# Patient Record
Sex: Female | Born: 1935
Health system: Southern US, Community
[De-identification: ages and names within clinical notes are randomized; demographics above are authoritative.]

## PROBLEM LIST (undated history)

## (undated) DIAGNOSIS — Z9889 Other specified postprocedural states: Secondary | ICD-10-CM

## (undated) DIAGNOSIS — R600 Localized edema: Secondary | ICD-10-CM

## (undated) DIAGNOSIS — C50912 Malignant neoplasm of unspecified site of left female breast: Secondary | ICD-10-CM

## (undated) DIAGNOSIS — I10 Essential (primary) hypertension: Secondary | ICD-10-CM

## (undated) DIAGNOSIS — J42 Unspecified chronic bronchitis: Secondary | ICD-10-CM

## (undated) DIAGNOSIS — M1990 Unspecified osteoarthritis, unspecified site: Secondary | ICD-10-CM

## (undated) DIAGNOSIS — C50919 Malignant neoplasm of unspecified site of unspecified female breast: Secondary | ICD-10-CM

## (undated) DIAGNOSIS — F419 Anxiety disorder, unspecified: Secondary | ICD-10-CM

## (undated) DIAGNOSIS — Z8489 Family history of other specified conditions: Secondary | ICD-10-CM

## (undated) DIAGNOSIS — E039 Hypothyroidism, unspecified: Secondary | ICD-10-CM

## (undated) DIAGNOSIS — D649 Anemia, unspecified: Secondary | ICD-10-CM

## (undated) DIAGNOSIS — R112 Nausea with vomiting, unspecified: Secondary | ICD-10-CM

## (undated) DIAGNOSIS — C189 Malignant neoplasm of colon, unspecified: Secondary | ICD-10-CM

## (undated) DIAGNOSIS — Z8669 Personal history of other diseases of the nervous system and sense organs: Secondary | ICD-10-CM

## (undated) DIAGNOSIS — I4892 Unspecified atrial flutter: Secondary | ICD-10-CM

## (undated) HISTORY — DX: Localized edema: R60.0

## (undated) HISTORY — PX: HERNIA REPAIR: SHX51

## (undated) HISTORY — DX: Essential (primary) hypertension: I10

## (undated) HISTORY — DX: Unspecified atrial flutter: I48.92

## (undated) HISTORY — DX: Personal history of other diseases of the nervous system and sense organs: Z86.69

## (undated) HISTORY — PX: CATARACT EXTRACTION: SUR2

## (undated) HISTORY — DX: Malignant neoplasm of unspecified site of unspecified female breast: C50.919

## (undated) HISTORY — DX: Malignant neoplasm of colon, unspecified: C18.9

## (undated) HISTORY — DX: Unspecified osteoarthritis, unspecified site: M19.90

## (undated) HISTORY — PX: COLECTOMY: SHX59

## (undated) HISTORY — PX: EYE SURGERY: SHX253

---

## 1973-05-01 HISTORY — PX: APPENDECTOMY: SHX54

## 1973-05-01 HISTORY — PX: ABDOMINAL HYSTERECTOMY: SHX81

## 1996-05-01 HISTORY — PX: BREAST LUMPECTOMY: SHX2

## 1996-05-01 HISTORY — PX: BREAST BIOPSY: SHX20

## 1997-09-21 ENCOUNTER — Other Ambulatory Visit: Admission: RE | Admit: 1997-09-21 | Discharge: 1997-09-21 | Payer: Self-pay | Admitting: Family Medicine

## 1997-11-17 ENCOUNTER — Other Ambulatory Visit: Admission: RE | Admit: 1997-11-17 | Discharge: 1997-11-17 | Payer: Self-pay | Admitting: Family Medicine

## 1998-03-09 ENCOUNTER — Encounter: Admission: RE | Admit: 1998-03-09 | Discharge: 1998-04-26 | Payer: Self-pay | Admitting: General Surgery

## 1998-11-23 ENCOUNTER — Other Ambulatory Visit: Admission: RE | Admit: 1998-11-23 | Discharge: 1998-11-23 | Payer: Self-pay | Admitting: Family Medicine

## 1999-11-16 ENCOUNTER — Other Ambulatory Visit: Admission: RE | Admit: 1999-11-16 | Discharge: 1999-11-16 | Payer: Self-pay | Admitting: Radiology

## 2000-05-01 HISTORY — PX: LOW ANTERIOR BOWEL RESECTION: SUR1240

## 2000-06-22 ENCOUNTER — Ambulatory Visit (HOSPITAL_COMMUNITY): Admission: RE | Admit: 2000-06-22 | Discharge: 2000-06-22 | Payer: Self-pay | Admitting: *Deleted

## 2000-06-22 ENCOUNTER — Encounter (INDEPENDENT_AMBULATORY_CARE_PROVIDER_SITE_OTHER): Payer: Self-pay | Admitting: Specialist

## 2000-06-29 ENCOUNTER — Encounter: Payer: Self-pay | Admitting: General Surgery

## 2000-06-29 ENCOUNTER — Encounter: Admission: RE | Admit: 2000-06-29 | Discharge: 2000-06-29 | Payer: Self-pay | Admitting: General Surgery

## 2000-07-10 ENCOUNTER — Encounter: Payer: Self-pay | Admitting: General Surgery

## 2000-07-10 ENCOUNTER — Encounter: Admission: RE | Admit: 2000-07-10 | Discharge: 2000-07-22 | Payer: Self-pay | Admitting: General Surgery

## 2000-07-17 ENCOUNTER — Inpatient Hospital Stay (HOSPITAL_COMMUNITY): Admission: RE | Admit: 2000-07-17 | Discharge: 2000-07-22 | Payer: Self-pay | Admitting: General Surgery

## 2000-07-17 ENCOUNTER — Encounter (INDEPENDENT_AMBULATORY_CARE_PROVIDER_SITE_OTHER): Payer: Self-pay

## 2000-08-23 ENCOUNTER — Encounter: Payer: Self-pay | Admitting: General Surgery

## 2000-08-23 ENCOUNTER — Ambulatory Visit (HOSPITAL_COMMUNITY): Admission: RE | Admit: 2000-08-23 | Discharge: 2000-08-23 | Payer: Self-pay | Admitting: General Surgery

## 2001-02-25 ENCOUNTER — Ambulatory Visit (HOSPITAL_BASED_OUTPATIENT_CLINIC_OR_DEPARTMENT_OTHER): Admission: RE | Admit: 2001-02-25 | Discharge: 2001-02-25 | Payer: Self-pay | Admitting: General Surgery

## 2001-07-03 ENCOUNTER — Ambulatory Visit (HOSPITAL_COMMUNITY): Admission: RE | Admit: 2001-07-03 | Discharge: 2001-07-03 | Payer: Self-pay | Admitting: *Deleted

## 2001-07-03 ENCOUNTER — Encounter (INDEPENDENT_AMBULATORY_CARE_PROVIDER_SITE_OTHER): Payer: Self-pay | Admitting: *Deleted

## 2003-12-24 ENCOUNTER — Encounter: Admission: RE | Admit: 2003-12-24 | Discharge: 2003-12-24 | Payer: Self-pay | Admitting: General Surgery

## 2004-09-14 ENCOUNTER — Encounter (INDEPENDENT_AMBULATORY_CARE_PROVIDER_SITE_OTHER): Payer: Self-pay | Admitting: Specialist

## 2004-09-14 ENCOUNTER — Ambulatory Visit (HOSPITAL_COMMUNITY): Admission: RE | Admit: 2004-09-14 | Discharge: 2004-09-14 | Payer: Self-pay | Admitting: *Deleted

## 2004-12-21 ENCOUNTER — Ambulatory Visit: Payer: Self-pay | Admitting: Oncology

## 2005-06-18 ENCOUNTER — Emergency Department (HOSPITAL_COMMUNITY): Admission: EM | Admit: 2005-06-18 | Discharge: 2005-06-18 | Payer: Self-pay | Admitting: Emergency Medicine

## 2005-12-17 ENCOUNTER — Ambulatory Visit: Payer: Self-pay | Admitting: Oncology

## 2005-12-19 LAB — CBC WITH DIFFERENTIAL/PLATELET
BASO%: 0.4 % (ref 0.0–2.0)
Basophils Absolute: 0 10*3/uL (ref 0.0–0.1)
EOS%: 2.8 % (ref 0.0–7.0)
HGB: 14 g/dL (ref 11.6–15.9)
LYMPH%: 27.5 % (ref 14.0–48.0)
MONO#: 0.4 10*3/uL (ref 0.1–0.9)
MONO%: 7.6 % (ref 0.0–13.0)
NEUT%: 61.7 % (ref 39.6–76.8)
RBC: 4.45 10*6/uL (ref 3.70–5.32)
RDW: 13.1 % (ref 11.3–14.5)
WBC: 5.6 10*3/uL (ref 3.9–10.0)
lymph#: 1.5 10*3/uL (ref 0.9–3.3)

## 2005-12-19 LAB — MORPHOLOGY
PLT EST: ADEQUATE
RBC Comments: NORMAL

## 2005-12-19 LAB — COMPREHENSIVE METABOLIC PANEL
ALT: 9 U/L (ref 0–40)
Alkaline Phosphatase: 49 U/L (ref 39–117)
BUN: 18 mg/dL (ref 6–23)
CO2: 23 mEq/L (ref 19–32)
Calcium: 9.4 mg/dL (ref 8.4–10.5)
Chloride: 106 mEq/L (ref 96–112)
Total Bilirubin: 1.5 mg/dL — ABNORMAL HIGH (ref 0.3–1.2)
Total Protein: 6.9 g/dL (ref 6.0–8.3)

## 2006-12-26 ENCOUNTER — Ambulatory Visit: Payer: Self-pay | Admitting: Oncology

## 2007-01-30 HISTORY — PX: ABDOMINAL HERNIA REPAIR: SHX539

## 2007-03-02 ENCOUNTER — Inpatient Hospital Stay (HOSPITAL_COMMUNITY): Admission: RE | Admit: 2007-03-02 | Discharge: 2007-03-03 | Payer: Self-pay | Admitting: General Surgery

## 2007-05-28 ENCOUNTER — Emergency Department (HOSPITAL_COMMUNITY): Admission: EM | Admit: 2007-05-28 | Discharge: 2007-05-28 | Payer: Self-pay | Admitting: Emergency Medicine

## 2007-12-13 ENCOUNTER — Encounter: Payer: Self-pay | Admitting: *Deleted

## 2008-06-29 ENCOUNTER — Ambulatory Visit: Payer: Self-pay | Admitting: Vascular Surgery

## 2009-05-01 HISTORY — PX: CATARACT EXTRACTION W/ INTRAOCULAR LENS  IMPLANT, BILATERAL: SHX1307

## 2010-09-13 NOTE — Op Note (Signed)
NAMECATHARINE, [name]             ACCOUNT NO.:  [id number]   MEDICAL RECORD NO.:  [id number]          PATIENT TYPE:  AMB   LOCATION:  DAY                          FACILITY:  [facility]   PHYSICIAN:  Adolph Pollack, M.D.DATE OF BIRTH:  April 19, 1928   DATE OF PROCEDURE:  03/01/2007  DATE OF DISCHARGE:                               OPERATIVE REPORT   PREOPERATIVE DIAGNOSIS:  Ventral incisional hernia.   POSTOPERATIVE DIAGNOSIS:  Ventral incisional hernia.   PROCEDURE:  Laparoscopic ventral incisional hernia repair with mesh.   SURGEON:  Adolph Pollack, M.D.   ASSISTANT:  Ollen Gross. Vernell Morgans, M.D.   ANESTHESIA:  General.   INDICATION:  [name] is a 77 year old female who has had a  hysterectomy and a sigmoid colectomy through midline incisions.  She has  developed a ventral incisional hernia that is getting larger and  concerning her.  No obstructive symptoms.  She now presents for elective  repair.  We discussed the procedure and the risks preoperatively.   TECHNIQUE:  She was seen in the holding area, then brought to the  operating room and placed supine on the operating table.  A general  anesthetic was administered.  The abdominal wall was then sterilely  prepped and draped after a Foley catheter was inserted.  In the right  upper quadrant, I made an incision through the skin and subcutaneous  tissue and fascial layers of peritoneum, entering the peritoneal cavity  under direct vision.  A pursestring suture of 0 Vicryl was placed around  the fascial edges.  A Hasson trocar was introduced in the peritoneal  cavity and pneumoperitoneum created by insufflation of CO2 gas.   Next, the angled laparoscope was introduced.  Adhesions were noted  between the omentum and part of the large and small bowel and the  anterior abdominal wall.  Under laparoscopic vision, a 5-mm trocar was  placed in the right lower quadrant.  Using sharp cold scissors, I lysed  adhesions between the  anterior abdominal wall, large intestine, small  intestine and the omentum and the periumbilical area and up and down the  midline.  No intestinal injury was noted.  Bleeding points on the  omentum were controlled with electrocautery.   Following this, I identified the periumbilical hernia.  Using a spinal  needle, I identified the 4 edges of the hernia in 4 quadrants and  measured 4 cm away from this for adequate overlap.  I then drew an oval;  this was approximately 15 x 18 cm.  I brought a piece of 15 x 18-cm  piece of Parietex mesh with a nonadherent barrier into the field.  In  the 4 quadrants I placed 4 anchoring sutures of 0 and #1 Novofil.  The  mesh was then hydrated then placed in the abdominal cavity.  The mesh  was deployed with the nonadherent side facing the viscera.  I then made  4 stab incisions in the 4 quadrants around the hernia and pulled up the  anchoring sutures across a fascial bridge and tied these down, anchoring  the mesh initially to the abdominal wall.  I then further anchored the  mesh to the abdominal wall with spiral tacks in both an outer and inner  rim of tacks; this provided for more than adequate coverage of the  hernia with adequate overlap.   I then inspected the area and the intestine.  No bleeding was noted and  no intestinal injury was noted.   Following this, I released the CO2 gas and watch the mesh approximate  the omentum and some of the viscera.  I then removed all trocars.   The right upper quadrant fascial defect was closed by tightening up and  tying down the pursestring suture.  All skin incisions were closed with  4-0 Monocryl subcuticular stitches followed by Steri-Strips and sterile  dressings.   She tolerated the procedure well without any apparent complications and  was taken to the recovery room in satisfactory condition.      Adolph Pollack, M.D.  Electronically Signed     TJR/MEDQ  D:  03/01/2007  T:  03/02/2007   Job:  [id number]   cc:   Gaspar Garbe, M.D.  Fax: [phone]

## 2010-09-13 NOTE — Procedures (Signed)
CAROTID DUPLEX EXAM   INDICATION:  Vertigo.   HISTORY:  Diabetes:  No.  Cardiac:  Atrial fib.  Hypertension:  Yes.  Smoking:  No.  Previous Surgery:  CV History:  Amaurosis Fugax No, Paresthesias No, Hemiparesis No.                                       RIGHT             LEFT  Brachial systolic pressure:         150               140  Brachial Doppler waveforms:         Biphasic          Biphasic  Vertebral direction of flow:        Antegrade         Antegrade  DUPLEX VELOCITIES (cm/sec)  CCA peak systolic                   92                113  ECA peak systolic                   108               154  ICA peak systolic                   81                88  ICA end diastolic                   23                31  PLAQUE MORPHOLOGY:                  Heterogenous      Heterogenous  PLAQUE AMOUNT:                      Mild              Mild  PLAQUE LOCATION:                    ICA, ECA          ICA, ECA   IMPRESSION:  1. 1-39% stenosis noted in bilateral internal carotid arteries.  2. Antegrade bilateral vertebral arteries.    ___________________________________________  Janetta Hora Fields, MD   MG/MEDQ  D:  06/29/2008  T:  06/29/2008  Job:  [id number]

## 2010-09-16 NOTE — Op Note (Signed)
[facility]  Patient:    [name], [name]              MRN: [id number] Proc. Date: 08/23/00 Adm. Date:  [id number] Attending:  Arlis Porta                           Operative Report  PREOPERATIVE DIAGNOSIS:  Colon cancer.  POSTOPERATIVE DIAGNOSIS:  Colon cancer.  PROCEDURE:  Insertion of single lumen Port-A-Cath in the right subclavian vein.  SURGEON:  Dr. Abbey Chatters.  ANESTHESIA:  Local (1% lidocaine with epinephrine plus 0.5% plain Marcaine) with MAC.  INDICATIONS FOR PROCEDURE:  [name] is a 77 year old status post low anterior resection for colorectal cancer. She is going to require chemotherapy and needs long-term access. The procedure and the risks of Port-A-Cath insertion including but not limited to bleeding, infection, pneumothorax as well as deep venous thrombosis were explained to her. She seemed to understand and agreed to proceed.  TECHNIQUE:  She was placed supine on the operating table and given intravenous sedation. A roll was placed under the back. The right chest wall and neck were sterilely prepped and draped. Local anesthetic was infiltrated in the right clavicular region and using a 16 gauge needle, the left subclavian vein was cannulated and a wire passed through to the superior vena cava. This was verified by way of fluoroscopy. Next, a local anesthetic was infiltrated inferior to the puncture site and a pocket created for the Port-A-Cath. The catheter was then tunneled from the inferior to the superior incision. The vein was dilated and then dilator introducer complex introduced into the vein. The wire and dilator were removed and the catheter threaded through the peel-away sheath introducer. The peel-away sheath was then removed. Under fluoroscopic guidance, the tip of the catheter was pulled back to the superior vena cava right atrial junction. The catheter was then cut and the catheter was inserted  onto the port. The port was tested with ______ and flushed easily. The port was then anchored to the chest wall with interrupted 2-0 Prolene sutures. I then used fluoroscopy to verify the position of the port and the smooth curve of the catheter as it went into the right subclavian vein and then into the superior vena cava.  Next the subcutaneous fat was closed over the port with running 3-0 Vicryl suture. The skin incisions were closed with 4-0 monocryl subcuticular stitches followed by Steri-Strips and sterile dressings.  The patient tolerated the procedure well without any apparent complications and was taken to the recovery room in satisfactory condition. A portable chest x-ray is pending at this time. She will be taking 1 mg of Coumadin a day to decrease the deep venous thrombosis risk. DD:  08/23/00 TD:  08/24/00 Job: [id number]

## 2010-09-16 NOTE — Discharge Summary (Signed)
[facility]  Patient:    [name], [name]              MRN: [id number] Adm. Date:  [id number] Disc. Date: [id number] Attending:  Arlis Porta CC:         Genene Churn. Cyndie Chime, M.D.             Lacretia Leigh. Quintella Reichert, M.D.             Katherine Roan, M.D.             Roosvelt Harps, M.D.                           Discharge Summary  PRINCIPLE DISCHARGE DIAGNOSIS:  Poorly differentiated adenocarcinoma of the sigmoid colon.  SECONDARY DIAGNOSES: 1. Breast cancer. 2. Hypertension. 3. Hypercholesterolemia. 4. Arthritis.  PROCEDURE:  Low anterior resection of the colon, July 17, 2000.  INDICATIONS:  The patient is a 77 year old female with history of invasive breast cancer in the past.  She was having some hematochezia and underwent colonoscopy.  There was a lesion in the sigmoid colon that was biopsied and positive for moderately differentiated adenocarcinoma at the biopsy site.  She is admitted for elective resection.  HOSPITAL COURSE:  She underwent the low anterior resection of the cancer which was at the rectosigmoid junction.  She tolerated this well.  Postoperatively, her antihypertensives were withheld.  Dr. Cyndie Chime was consulted regarding her colon cancer, and he saw her in the hospital.  Plans were made to discuss the possibility of chemotherapy as an outpatient.  It should be noted that she had some mild hyperbilirubinemia, but this was preoperative, and the liver was normal to visualization and palpation.  She had some mild hypovolemia which was resolved.  She was restarted on her antihypertensive as her blood pressure began to become a little bit higher.  She had an area of wound that appeared to be a little bit indurated and red, and I probed this area, but I could not note any infection.  She was empirically started on antibiotics, and it cleared up nicely.  By her fifth postoperative day, she had been passing flatus,  tolerating a diet.  She had a BM on her third postoperative day.  She is felt to be ready for discharge.  Of note, is that she was in the [place] study.  DISPOSITION:  Discharged to home in satisfactory condition, July 22, 2000.  DISCHARGE FOLLOWUP:  She will return in approximately one week for staple removal.  DISCHARGE INSTRUCTIONS:  She was given an instruction sheet.  DISCHARGE MEDICATIONS:  She will take all of her prehospitalization medications and Vicodin for pain. DD:  08/07/00 TD:  08/07/00 Job: [id number]

## 2010-09-16 NOTE — Consult Note (Signed)
[facility]  Patient:    [name], [name]              MRN: [id number] Proc. Date: 07/18/00 Adm. Date:  [id number] Attending:  Arlis Porta CC:         Adolph Pollack, M.D.  Katherine Roan, M.D.  Roosvelt Harps, M.D.  Lacretia Leigh. Quintella Reichert, M.D.   Consultation Report  HISTORY OF PRESENT ILLNESS:  Pleasant 77 year old woman well known to me. She was previously diagnosed with breast cancer in 1998. She was treated with chemotherapy following her surgery. Back in November of last year she noted a scant amount of blood on her toilet tissue. She mentioned this to her gynecologist at time of her routine exam in January of this year. He arranged for a colonoscopy done by Dr. Roosvelt Harps on June 02, 2000. She was found to have a cancer at the rectosigmoid junction. She underwent exploratory laparotomy by Dr. Avel Peace on July 17, 2000. Her liver was normal to inspection and palpation. No peritoneal spread. A low anterior resection was performed. Pathology shows a 4 cm poorly differentiated adenocarcinoma invading into and focally through the muscularis. Seven lymph nodes are negative.  IMPRESSION:   T2, N0, M0 colon cancer in a lady with a history of previous stage II breast cancer.  RECOMMENDATIONS:  We are offering adjuvant chemotherapy to Mohs stage II patients at this time. Her situation is borderline with only focal extension through the bowel wall, however, the poorly differentiated histology is an adverse prognostic factor.  She is undecided at this time whether or not she wants to go on a chemotherapy program in view of her previous negative experience with chemotherapy for breast cancer.  I have encouraged her to take her time to make a decision she is comfortable with. I will arrange an outpatient follow-up session for further discussion when she recuperates fully from surgery.  LABORATORY AND ACCESSORY  DATA:  Preoperative CT scan showed no evidence of metastatic disease. Preoperative labs showed a hemoglobin of 14.3, normal liver chemistries except bilirubin of 1.4, CEA of 0.7, LDH 166.  Chest radiograph was negative. Most recent mammograms done July 10, 2000 were negative.  Thank you for this consultation. DD:  07/19/00 TD:  07/19/00 Job: [id number]

## 2010-09-16 NOTE — Op Note (Signed)
[facility]  Patient:    [name]              MRN: [id number] Proc. Date: 07/17/00 Adm. Date:  [id number] Attending:  Arlis Porta CC:         Leroy Sea., M.D.  Roosvelt Harps, M.D.  Genene Churn. Cyndie Chime, M.D.   Operative Report  PREOPERATIVE DIAGNOSIS:  Sigmoid colon cancer.  POSTOPERATIVE DIAGNOSIS:  Cancer of the rectosigmoid junction.  PROCEDURE:  Low anterior resection of the rectosigmoid junction.  SURGEON:  Dr. Abbey Chatters.  ASSISTANT:  Dr. Lebron Conners.  ANESTHESIA:  General.  INDICATIONS FOR PROCEDURE:  This is a 77 year old female who was having some hematochezia and some lower abdominal pain. She underwent a colonoscopy which demonstrated some ascending colon polyps that were biopsied and tubular adenomas. There was an area of the sigmoid colon where a large flat mass was noted and was biopsied and was positive for moderately differentiated adenocarcinoma. She is admitted now for elective resection. CT scan did not demonstrate any hepatic metastases. She did have mild hyperbilirubinemia with a bilirubin of 1.4.  FINDINGS:  The tumor was right at the rectosigmoid junction just proximal to the peritoneal reflection. The liver was smooth without lesions. The gallbladder was distended without stones. No noted small bowel metastases.  TECHNIQUE:  She is placed supine on the operating table and a general anesthetic was administered. She was then placed in the lithotomy position. The abdomen and perineum were sterilely prepped and draped. A lower midline incision was made beginning at the pubis and extending up just superior to the umbilicus. The subcutaneous tissue and fascia were divided with the cautery. The peritoneum was entered sharply. Once in the peritoneal cavity, I palpated the lesion right at the rectosigmoid junction just proximal to the peritoneal reflection. I then explored the abdomen  and noted that the liver was smooth without lesions. The gallbladder was distended but I could not palpate gallstones. There were no obvious small bowel lesions.  Next, she was placed in the Trendelenburg position and then began by localizing a point near the proximal sigmoid colon area and dividing the sigmoid colon here. I then resected a wedge of mesentery all the way down to the sacral brim and ligated the mesenteric vessels and veins with ties. Before I did this, I did identify the left ureter and no injury to it was noted. At this point, I had mobilized the rectum from the pelvic brim distally and then mobilized the lateral stalks with the cautery using clips. When I was 3-4 cm distal to the tumor, I divided the colon with the reticulating stapler. I subsequently passed the specimen off the field.  I then mobilized the sigmoid colon and descending colon by incising its lateral attachments. This allowed for the proximal sigmoid colon to come down in the pelvis without tension. I removed the staple line from the proximal sigmoid colon and placed a size 31 anvil in it. A pursestring suture was placed around the opening and this held in the anvil in. Next, the anus was dilated and the handle end of the size 31 EEA stapler was passed through the anus until I could see it in the rectum. A stapled circular anastomosis was then performed. Proximal to the anastomosis, the bowel was occluded and air was insufflated with water in the pelvis and no leaks were noted. The anastomosis appeared healthy by way of proctosigmoidoscopy.  Next, I copiously irrigated the abdomen and a  few bleeding points were controlled with the cautery. When hemostasis was adequate, more irrigation was performed of the left gutter area. I then placed the omentum over the small bowel. The instrument and sponge counts at this point were reported to be correct.  The fascia was then closed with a running #1 PDS suture.  The subcutaneous tissue was irrigated and the skin was closed with staples.  The patient tolerated the procedure well without any apparent complications and was taken to the recovery room in satisfactory condition. DD:  07/17/00 TD:  07/18/00 Job: [id number]

## 2011-02-08 LAB — DIFFERENTIAL
Band Neutrophils: 0
Basophils Absolute: 0
Basophils Relative: 0
Blasts: 0
Eosinophils Absolute: 0.2
Eosinophils Relative: 3
Lymphs Abs: 1.5
Monocytes Absolute: 0.5
Monocytes Relative: 7
Myelocytes: 0
Neutro Abs: 5.2
Neutrophils Relative %: 70
nRBC: 0

## 2011-02-08 LAB — COMPREHENSIVE METABOLIC PANEL
CO2: 30
Calcium: 9.4
Chloride: 100
Creatinine, Ser: 0.9
Sodium: 136

## 2011-02-08 LAB — CBC
MCV: 90.1
Platelets: 189
RDW: 12.7

## 2011-07-13 ENCOUNTER — Encounter: Payer: Self-pay | Admitting: *Deleted

## 2011-07-13 DIAGNOSIS — C801 Malignant (primary) neoplasm, unspecified: Secondary | ICD-10-CM | POA: Insufficient documentation

## 2011-07-13 DIAGNOSIS — I4891 Unspecified atrial fibrillation: Secondary | ICD-10-CM | POA: Insufficient documentation

## 2011-07-13 DIAGNOSIS — Z8669 Personal history of other diseases of the nervous system and sense organs: Secondary | ICD-10-CM | POA: Insufficient documentation

## 2011-08-21 ENCOUNTER — Other Ambulatory Visit: Payer: Self-pay | Admitting: Neurology

## 2011-08-21 DIAGNOSIS — R413 Other amnesia: Secondary | ICD-10-CM

## 2011-08-21 DIAGNOSIS — R269 Unspecified abnormalities of gait and mobility: Secondary | ICD-10-CM

## 2011-08-23 ENCOUNTER — Ambulatory Visit
Admission: RE | Admit: 2011-08-23 | Discharge: 2011-08-23 | Disposition: A | Discharge status: Home / Self Care | Payer: Medicare Other | Source: Ambulatory Visit | Attending: Neurology | Admitting: Neurology

## 2011-08-23 DIAGNOSIS — R413 Other amnesia: Secondary | ICD-10-CM

## 2011-08-23 DIAGNOSIS — R269 Unspecified abnormalities of gait and mobility: Secondary | ICD-10-CM

## 2011-09-29 ENCOUNTER — Encounter: Payer: Self-pay | Admitting: *Deleted

## 2011-10-04 ENCOUNTER — Encounter: Payer: Self-pay | Admitting: Cardiovascular Disease

## 2013-09-25 ENCOUNTER — Other Ambulatory Visit: Payer: Self-pay | Admitting: Otolaryngology

## 2013-09-25 DIAGNOSIS — H903 Sensorineural hearing loss, bilateral: Secondary | ICD-10-CM

## 2013-09-25 DIAGNOSIS — H905 Unspecified sensorineural hearing loss: Secondary | ICD-10-CM

## 2013-10-01 ENCOUNTER — Encounter (INDEPENDENT_AMBULATORY_CARE_PROVIDER_SITE_OTHER): Payer: Self-pay

## 2013-10-01 ENCOUNTER — Ambulatory Visit
Admission: RE | Admit: 2013-10-01 | Discharge: 2013-10-01 | Disposition: A | Discharge status: Home / Self Care | Payer: 59 | Source: Ambulatory Visit | Attending: Otolaryngology | Admitting: Otolaryngology

## 2013-10-01 DIAGNOSIS — H903 Sensorineural hearing loss, bilateral: Secondary | ICD-10-CM

## 2013-10-01 DIAGNOSIS — H905 Unspecified sensorineural hearing loss: Secondary | ICD-10-CM

## 2013-10-01 MED ORDER — GADOBENATE DIMEGLUMINE 529 MG/ML IV SOLN
18.0000 mL | Freq: Once | INTRAVENOUS | Status: AC | PRN
  Administered 2013-10-01: 18 mL via INTRAVENOUS

## 2013-10-07 ENCOUNTER — Other Ambulatory Visit: Payer: BC Managed Care – PPO

## 2014-01-03 ENCOUNTER — Inpatient Hospital Stay (HOSPITAL_COMMUNITY): Payer: Medicare Other

## 2014-01-03 ENCOUNTER — Encounter (HOSPITAL_COMMUNITY): Payer: Self-pay | Admitting: Emergency Medicine

## 2014-01-03 ENCOUNTER — Emergency Department (HOSPITAL_COMMUNITY): Payer: Medicare Other

## 2014-01-03 ENCOUNTER — Inpatient Hospital Stay (HOSPITAL_COMMUNITY)
Admission: EM | Admit: 2014-01-03 | Discharge: 2014-01-06 | DRG: 308 | Disposition: A | Discharge status: Home / Self Care | Payer: Medicare Other | Attending: Internal Medicine | Admitting: Internal Medicine

## 2014-01-03 DIAGNOSIS — I498 Other specified cardiac arrhythmias: Principal | ICD-10-CM | POA: Diagnosis present

## 2014-01-03 DIAGNOSIS — I484 Atypical atrial flutter: Secondary | ICD-10-CM

## 2014-01-03 DIAGNOSIS — Z85038 Personal history of other malignant neoplasm of large intestine: Secondary | ICD-10-CM | POA: Diagnosis not present

## 2014-01-03 DIAGNOSIS — I10 Essential (primary) hypertension: Secondary | ICD-10-CM | POA: Diagnosis present

## 2014-01-03 DIAGNOSIS — Z87891 Personal history of nicotine dependence: Secondary | ICD-10-CM | POA: Diagnosis not present

## 2014-01-03 DIAGNOSIS — E039 Hypothyroidism, unspecified: Secondary | ICD-10-CM | POA: Diagnosis present

## 2014-01-03 DIAGNOSIS — I4891 Unspecified atrial fibrillation: Secondary | ICD-10-CM | POA: Diagnosis present

## 2014-01-03 DIAGNOSIS — G9340 Encephalopathy, unspecified: Secondary | ICD-10-CM | POA: Diagnosis present

## 2014-01-03 DIAGNOSIS — I4892 Unspecified atrial flutter: Secondary | ICD-10-CM | POA: Diagnosis present

## 2014-01-03 DIAGNOSIS — R079 Chest pain, unspecified: Secondary | ICD-10-CM

## 2014-01-03 DIAGNOSIS — I471 Supraventricular tachycardia: Secondary | ICD-10-CM

## 2014-01-03 DIAGNOSIS — R7989 Other specified abnormal findings of blood chemistry: Secondary | ICD-10-CM | POA: Diagnosis present

## 2014-01-03 DIAGNOSIS — Z8249 Family history of ischemic heart disease and other diseases of the circulatory system: Secondary | ICD-10-CM

## 2014-01-03 DIAGNOSIS — Z8669 Personal history of other diseases of the nervous system and sense organs: Secondary | ICD-10-CM

## 2014-01-03 DIAGNOSIS — R0789 Other chest pain: Secondary | ICD-10-CM | POA: Diagnosis present

## 2014-01-03 DIAGNOSIS — I369 Nonrheumatic tricuspid valve disorder, unspecified: Secondary | ICD-10-CM

## 2014-01-03 LAB — COMPREHENSIVE METABOLIC PANEL
ALK PHOS: 58 U/L (ref 39–117)
ALT: 29 U/L (ref 0–35)
AST: 20 U/L (ref 0–37)
Albumin: 3.6 g/dL (ref 3.5–5.2)
Anion gap: 14 (ref 5–15)
BUN: 21 mg/dL (ref 6–23)
CO2: 24 meq/L (ref 19–32)
Calcium: 9.1 mg/dL (ref 8.4–10.5)
Chloride: 103 mEq/L (ref 96–112)
Creatinine, Ser: 0.85 mg/dL (ref 0.50–1.10)
GFR calc Af Amer: 71 mL/min — ABNORMAL LOW (ref >90)
GFR, EST NON AFRICAN AMERICAN: 61 mL/min — AB (ref >90)
GLUCOSE: 159 mg/dL — AB (ref 70–99)
POTASSIUM: 4 meq/L (ref 3.7–5.3)
SODIUM: 141 meq/L (ref 137–147)
Total Bilirubin: 2.2 mg/dL — ABNORMAL HIGH (ref 0.3–1.2)
Total Protein: 6.7 g/dL (ref 6.0–8.3)

## 2014-01-03 LAB — TSH: TSH: 4.71 u[IU]/mL — AB (ref 0.350–4.500)

## 2014-01-03 LAB — PROTIME-INR
INR: 1.01 (ref 0.00–1.49)
INR: 1.14 (ref 0.00–1.49)
Prothrombin Time: 13.3 seconds (ref 11.6–15.2)
Prothrombin Time: 14.6 seconds (ref 11.6–15.2)

## 2014-01-03 LAB — CBC WITH DIFFERENTIAL/PLATELET
BASOS PCT: 0 % (ref 0–1)
Basophils Absolute: 0 10*3/uL (ref 0.0–0.1)
Eosinophils Absolute: 0 10*3/uL (ref 0.0–0.7)
Eosinophils Relative: 0 % (ref 0–5)
HEMATOCRIT: 40.1 % (ref 36.0–46.0)
Hemoglobin: 13.1 g/dL (ref 12.0–15.0)
LYMPHS PCT: 11 % — AB (ref 12–46)
Lymphs Abs: 1.2 10*3/uL (ref 0.7–4.0)
MCH: 30.2 pg (ref 26.0–34.0)
MCHC: 32.7 g/dL (ref 30.0–36.0)
MCV: 92.4 fL (ref 78.0–100.0)
MONO ABS: 1 10*3/uL (ref 0.1–1.0)
MONOS PCT: 10 % (ref 3–12)
Neutro Abs: 8.4 10*3/uL — ABNORMAL HIGH (ref 1.7–7.7)
Neutrophils Relative %: 79 % — ABNORMAL HIGH (ref 43–77)
Platelets: 161 10*3/uL (ref 150–400)
RBC: 4.34 MIL/uL (ref 3.87–5.11)
RDW: 13.6 % (ref 11.5–15.5)
WBC: 10.5 10*3/uL (ref 4.0–10.5)

## 2014-01-03 LAB — BASIC METABOLIC PANEL
Anion gap: 17 — ABNORMAL HIGH (ref 5–15)
BUN: 26 mg/dL — AB (ref 6–23)
CALCIUM: 9.7 mg/dL (ref 8.4–10.5)
CO2: 23 mEq/L (ref 19–32)
CREATININE: 1.02 mg/dL (ref 0.50–1.10)
Chloride: 100 mEq/L (ref 96–112)
GFR, EST AFRICAN AMERICAN: 57 mL/min — AB (ref >90)
GFR, EST NON AFRICAN AMERICAN: 49 mL/min — AB (ref >90)
Glucose, Bld: 150 mg/dL — ABNORMAL HIGH (ref 70–99)
Potassium: 4.1 mEq/L (ref 3.7–5.3)
Sodium: 140 mEq/L (ref 137–147)

## 2014-01-03 LAB — CBC
HEMATOCRIT: 45.2 % (ref 36.0–46.0)
Hemoglobin: 15.1 g/dL — ABNORMAL HIGH (ref 12.0–15.0)
MCH: 31 pg (ref 26.0–34.0)
MCHC: 33.4 g/dL (ref 30.0–36.0)
MCV: 92.8 fL (ref 78.0–100.0)
Platelets: 170 10*3/uL (ref 150–400)
RBC: 4.87 MIL/uL (ref 3.87–5.11)
RDW: 13.7 % (ref 11.5–15.5)
WBC: 12 10*3/uL — ABNORMAL HIGH (ref 4.0–10.5)

## 2014-01-03 LAB — TROPONIN I: Troponin I: <0.3 ng/mL (ref <0.30)

## 2014-01-03 LAB — PRO B NATRIURETIC PEPTIDE: Pro B Natriuretic peptide (BNP): 838.2 pg/mL — ABNORMAL HIGH (ref 0–450)

## 2014-01-03 LAB — APTT
aPTT: 27 seconds (ref 24–37)
aPTT: 26 seconds (ref 24–37)

## 2014-01-03 LAB — MRSA PCR SCREENING: MRSA by PCR: NEGATIVE

## 2014-01-03 LAB — PHOSPHORUS: Phosphorus: 3 mg/dL (ref 2.3–4.6)

## 2014-01-03 LAB — I-STAT TROPONIN, ED: Troponin i, poc: 0 ng/mL (ref 0.00–0.08)

## 2014-01-03 LAB — MAGNESIUM: Magnesium: 1.9 mg/dL (ref 1.5–2.5)

## 2014-01-03 MED ORDER — SODIUM CHLORIDE 0.9 % IJ SOLN
10.0000 mL | Freq: Two times a day (BID) | INTRAMUSCULAR | Status: DC
  Administered 2014-01-03 – 2014-01-06 (×4): 10 mL

## 2014-01-03 MED ORDER — DILTIAZEM HCL 100 MG IV SOLR: 10.0000 mg/h | Freq: Once | INTRAVENOUS | Status: DC

## 2014-01-03 MED ORDER — ADENOSINE 6 MG/2ML IV SOLN
INTRAVENOUS | Status: AC
  Administered 2014-01-03: 6 mg via INTRAVENOUS
  Filled 2014-01-03: qty 2

## 2014-01-03 MED ORDER — ACETAMINOPHEN 325 MG PO TABS
650.0000 mg | ORAL_TABLET | Freq: Four times a day (QID) | ORAL | Status: DC | PRN
Start: 2014-01-03 — End: 2014-01-06

## 2014-01-03 MED ORDER — ONDANSETRON HCL 4 MG/2ML IJ SOLN: 4.0000 mg | Freq: Four times a day (QID) | INTRAMUSCULAR | Status: DC | PRN

## 2014-01-03 MED ORDER — HEPARIN (PORCINE) IN NACL 100-0.45 UNIT/ML-% IJ SOLN
1100.0000 [IU]/h | INTRAMUSCULAR | Status: AC
  Administered 2014-01-03: 1100 [IU]/h via INTRAVENOUS
  Filled 2014-01-03 (×4): qty 250

## 2014-01-03 MED ORDER — ASPIRIN 325 MG PO TABS
325.0000 mg | ORAL_TABLET | ORAL | Status: DC
  Filled 2014-01-03: qty 1

## 2014-01-03 MED ORDER — DILTIAZEM LOAD VIA INFUSION
10.0000 mg | Freq: Once | INTRAVENOUS | Status: AC
  Administered 2014-01-03: 10 mg via INTRAVENOUS
  Filled 2014-01-03: qty 10

## 2014-01-03 MED ORDER — SODIUM CHLORIDE 0.9 % IJ SOLN
3.0000 mL | Freq: Two times a day (BID) | INTRAMUSCULAR | Status: DC
  Administered 2014-01-03 – 2014-01-05 (×3): 3 mL via INTRAVENOUS

## 2014-01-03 MED ORDER — ONDANSETRON HCL 4 MG PO TABS: 4.0000 mg | ORAL_TABLET | Freq: Four times a day (QID) | ORAL | Status: DC | PRN

## 2014-01-03 MED ORDER — ASPIRIN 81 MG PO CHEW
CHEWABLE_TABLET | ORAL | Status: AC
  Administered 2014-01-03: 243 mg
  Filled 2014-01-03: qty 3

## 2014-01-03 MED ORDER — LEVOTHYROXINE SODIUM 25 MCG PO TABS
25.0000 ug | ORAL_TABLET | Freq: Every day | ORAL | Status: DC
  Administered 2014-01-04 – 2014-01-06 (×3): 25 ug via ORAL
  Filled 2014-01-03 (×4): qty 1

## 2014-01-03 MED ORDER — HEPARIN BOLUS VIA INFUSION
2000.0000 [IU] | Freq: Once | INTRAVENOUS | Status: AC
  Administered 2014-01-03: 2000 [IU] via INTRAVENOUS
  Filled 2014-01-03: qty 2000

## 2014-01-03 MED ORDER — ACETAMINOPHEN 650 MG RE SUPP
650.0000 mg | Freq: Four times a day (QID) | RECTAL | Status: DC | PRN
Start: 2014-01-03 — End: 2014-01-06

## 2014-01-03 MED ORDER — SODIUM CHLORIDE 0.9 % IV SOLN
INTRAVENOUS | Status: DC
  Administered 2014-01-03: 12:00:00 via INTRAVENOUS

## 2014-01-03 MED ORDER — NITROGLYCERIN 0.4 MG SL SUBL
0.4000 mg | SUBLINGUAL_TABLET | SUBLINGUAL | Status: DC | PRN
  Administered 2014-01-03: 0.4 mg via SUBLINGUAL
  Filled 2014-01-03: qty 1

## 2014-01-03 MED ORDER — MORPHINE SULFATE 2 MG/ML IJ SOLN: 1.0000 mg | INTRAMUSCULAR | Status: DC | PRN

## 2014-01-03 MED ORDER — DILTIAZEM HCL 100 MG IV SOLR
5.0000 mg/h | INTRAVENOUS | Status: DC
  Administered 2014-01-03 (×3): 10 mg/h via INTRAVENOUS
  Administered 2014-01-03: 5 mg/h via INTRAVENOUS

## 2014-01-03 MED ORDER — DILTIAZEM HCL 30 MG PO TABS
30.0000 mg | ORAL_TABLET | Freq: Once | ORAL | Status: DC
  Filled 2014-01-03: qty 1

## 2014-01-03 MED ORDER — SODIUM CHLORIDE 0.9 % IJ SOLN: 10.0000 mL | INTRAMUSCULAR | Status: DC | PRN

## 2014-01-03 NOTE — Progress Notes (Signed)
Patient complaining of chest discomfort, "tight and pressure." Patient also stating having hard time catching breath. EKG being performed, On-call hospitalist (Tom) text-paged. Agricultural consultant and self at bedside.

## 2014-01-03 NOTE — Progress Notes (Signed)
ANTICOAGULATION CONSULT NOTE - Initial Consult  Pharmacy Consult for Heparin Indication: atrial fibrillation/flutter  Allergies  Allergen Reactions  . Codeine     Patient Measurements: Weight: 192 lb (87.091 kg)  Vital Signs: Temp: 97.7 F (36.5 C) (09/05 0608) Temp src: Oral (09/05 0608) BP: 131/75 mmHg (09/05 0830) Pulse Rate: 52 (09/05 0830)  Labs:  Recent Labs  01/03/14 0628  HGB 15.1*  HCT 45.2  PLT 170  CREATININE 1.02    CrCl is unknown because there is no height on file for the current visit.   Medical History: Past Medical History  Diagnosis Date  . Hypertension   . Atrial fibrillation     Hx. of brief episode  . H/O Bell's palsy   . Cancer     breast,colon    Medications:  Scheduled:   Infusions:  . diltiazem (CARDIZEM) infusion 10 mg/hr (01/03/14 0741)   PTA Meds: Aspirin 81mg  daily - took a dose prior to coming to ED Irbesartan 300mg  daily Levothyroxine 20mcg daily Ocuvite one tablet daily Ibruprofen 200mg  daily as needed (none taken in past week)   Assessment: 80 year old female presents to ED with palpitations and chest heaviness. EKG shows Supraventricular tachycardia. Cardiology was consulted and diltiazem drip was started and pharmacy is consulted to dose IV heparin.    Goal of Therapy:  Heparin level 0.3-0.7 units/ml Monitor platelets by anticoagulation protocol: Yes   Plan:   Heparin 2000 units IV bolus  Heparin infusion 1100 units/hr  Check heparin level in 8hrs  Daily heparin level and CBC  Peggyann Juba, PharmD, BCPS Pager: [phone] 01/03/2014,8:42 AM

## 2014-01-03 NOTE — H&P (Addendum)
[facility] History and Physical  [name] [id number]:[id number] DOB: 01/18/1928 DOA: 01/03/2014  Referring physician: ER physician PCP: Haywood Pao, MD   Chief Complaint: palpitations, weakness  HPI:  80 y.o.female with past medical history of hypertension, hypothyroidism who presented to [facility] ED 01/03/2014 with complaints of fluttering feeling in the chest. Patient reported this feeling started around 4:00 AM prior to this admission. She felt chest tightness and heaviness around the neck. This has improved now while in emergency room. No radiation to left arm. Patient reporting feeling weak over past week or so and reports being able to ambulate very short distances before getting fatigued. No reports of cough or fever. No abdominal pain, nausea or vomiting. No lightheadedness or loss of consciousness. No reports of shortness of breath. In ED, heart rate was 143 and 12-lead EKG shows a supraventricular tachycardia. Please note that patient had a history of supraventricular tachycardia in the past and was given diltiazem by her primary care physician to take it only if needed but she hasn't taken that in last couple of years. In emergency room she received one dose of adenosine which slowed down the heart rate but it revealed atrial flutter. Cardiology saw the patient in consultation. She was started on Cardizem drip and per cardiology needs to be on heparin drip as well. Chest x-ray on admission showed mild pulmonary vascular congestion but no consolidation or edema. Patient was admitted to step down unit for further evaluation and management.  Assessment & Plan    Principal problem:  Chest pain / SVT / atrial flutter  Appreciate cardiology consult and their recommendations.  Rate slowed down with adenosine and patient now requires Cardizem drip.  Per cardiology, patient will also be started on heparin drip.  Cycle cardiac enzymes, obtain 2-D echo.  May use morphine 1 mg every 4  hours as needed IV for pain control. Continue oxygen support with nasal cannula to keep oxygen saturation above 90%.  Active problems: Hypertension  On Cardizem drip Hypothyroidism  Restart levothyroxine  Check TSH  DVT prophylaxis:   Patient will be started on heparin drip for full anticoagulation.  Radiological Exams on Admission:  Dg Chest Port 1 View 01/03/2014   Cardiac enlargement with mild pulmonary vascular congestion. No consolidation or edema.     EKG: Supraventricular tachycardia  Code Status: Full Family Communication: Plan of care discussed with the patient  Disposition Plan: Admit for further evaluation  Leisa Lenz, MD  [facility] Pager [phone]  Review of Systems:  Constitutional: Negative for fever, chills and positive for malaise/fatigue. Negative for diaphoresis.  HENT: Negative for hearing loss, ear pain, nosebleeds, congestion, sore throat, neck pain, tinnitus and ear discharge.   Eyes: Negative for blurred vision, double vision, photophobia, pain, discharge and redness.  Respiratory: Negative for cough, hemoptysis, sputum production, shortness of breath, wheezing and stridor.   Cardiovascular: Positive for chest tightness, palpitations, no lower extremity swelling. Gastrointestinal: Negative for nausea, vomiting and abdominal pain. Negative for heartburn, constipation, blood in stool and melena.  Genitourinary: Negative for dysuria, urgency, frequency, hematuria and flank pain.  Musculoskeletal: Negative for myalgias, back pain, joint pain and falls.  Skin: Negative for itching and rash.  Neurological: Negative for dizziness and positive for weakness. Negative for tingling, tremors, sensory change, speech change, focal weakness, loss of consciousness and headaches.  Endo/Heme/Allergies: Negative for environmental allergies and polydipsia. Does not bruise/bleed easily.  Psychiatric/Behavioral: Negative for suicidal ideas. The patient is not  nervous/anxious.  Past Medical History  Diagnosis Date  . Hypertension   . Atrial fibrillation     Hx. of brief episode  . H/O Bell's palsy   . Cancer     breast,colon   Past Surgical History  Procedure Laterality Date  . Breast lumpectomy      left  . Colectomy      and ventral hernia repair with mesh placement  . Other surgical history      hysterectomy  . Abdominal hysterectomy    . Hernia repair    . Cataract extraction     Social History:  reports that she has quit smoking. Her smoking use included Cigarettes. She smoked 0.00 packs per day. She does not have any smokeless tobacco history on file. She reports that she does not drink alcohol or use illicit drugs.  Allergies  Allergen Reactions  . Codeine     Family History: htn in family    Prior to Admission medications   Medication Sig Start Date End Date Taking? Authorizing Provider  aspirin 81 MG tablet Take 81 mg by mouth daily.   Yes Historical Provider, MD  beta carotene w/minerals (OCUVITE) tablet Take 1 tablet by mouth daily.   Yes Historical Provider, MD  Ibuprofen (ADVIL PO) Take 200 mg by mouth daily as needed. For pain   Yes Historical Provider, MD  irbesartan (AVAPRO) 300 MG tablet Take 300 mg by mouth daily.   Yes Historical Provider, MD  levothyroxine (SYNTHROID, LEVOTHROID) 25 MCG tablet Take 25 mcg by mouth daily before breakfast.   Yes Historical Provider, MD   Physical Exam: Filed Vitals:   01/03/14 0745 01/03/14 0800 01/03/14 0815 01/03/14 0830  BP: 138/122 137/92 132/94 131/75  Pulse: 42 71 62 52  Temp:      TempSrc:      Resp: 20 19 23 12   Weight:      SpO2: 87% 98% 95% 98%    Physical Exam  Constitutional: Appears well-developed and well-nourished. No distress.  HENT: Normocephalic. No tonsillar erythema or exudates Eyes: Conjunctivae and EOM are normal. PERRLA, no scleral icterus.  Neck: Normal ROM. Neck supple. No JVD. No tracheal deviation. No thyromegaly.  CVS: Tachycardic,  S1/S2 appreciated Pulmonary: Effort and breath sounds normal, no stridor, rhonchi, wheezes, rales.  Abdominal: Soft. BS +,  no distension, tenderness, rebound or guarding.  Musculoskeletal: Normal range of motion. No edema and no tenderness.  Lymphadenopathy: No lymphadenopathy noted, cervical, inguinal. Neuro: Alert. Normal reflexes, muscle tone coordination. No focal neurologic deficits. Skin: Skin is warm and dry.  Not diaphoretic. No erythema. No pallor.  Psychiatric: Normal mood and affect. Behavior, judgment, thought content normal.   Labs on Admission:  Basic Metabolic Panel:  Recent Labs Lab 01/03/14 0628  NA 140  K 4.1  CL 100  CO2 23  GLUCOSE 150*  BUN 26*  CREATININE 1.02  CALCIUM 9.7   Liver Function Tests: No results found for this basename: AST, ALT, ALKPHOS, BILITOT, PROT, ALBUMIN,  in the last 168 hours No results found for this basename: LIPASE, AMYLASE,  in the last 168 hours No results found for this basename: AMMONIA,  in the last 168 hours CBC:  Recent Labs Lab 01/03/14 0628  WBC 12.0*  HGB 15.1*  HCT 45.2  MCV 92.8  PLT 170   Cardiac Enzymes: No results found for this basename: CKTOTAL, CKMB, CKMBINDEX, TROPONINI,  in the last 168 hours BNP: No components found with this basename: POCBNP,  CBG: No results found  for this basename: GLUCAP,  in the last 168 hours  If 7PM-7AM, please contact night-coverage [email or web address] Password [facility] 01/03/2014, 8:49 AM

## 2014-01-03 NOTE — ED Notes (Signed)
ICU questioning if pt could possibly be telemetry instead of ICU.  Hospitalist paged.

## 2014-01-03 NOTE — ED Notes (Signed)
Pt states she took 81mg  aspirin before coming to hospital, will ask EDP before administrating aspirin order

## 2014-01-03 NOTE — Progress Notes (Signed)
Nitro was ordered and Gershon Mussel stated will come by and look at EKG. Also requested cardiology to be notified. Will continue to monitor.

## 2014-01-03 NOTE — ED Notes (Signed)
Dr. Ron Parker and Dr. Charlies Silvers have both come to assess patient.

## 2014-01-03 NOTE — Progress Notes (Signed)
Unable to start heparin drip due to limited venous access. Notified MD, told to wait for PICC team,. and will reassess in 1 hr. Will continue to monitor pt.

## 2014-01-03 NOTE — Progress Notes (Addendum)
Peripherally Inserted Central Catheter/Midline Placement  The IV Nurse has discussed with the patient and/or persons authorized to consent for the patient, the purpose of this procedure and the potential benefits and risks involved with this procedure.  The benefits include less needle sticks, lab draws from the catheter and patient may be discharged home with the catheter.  Risks include, but not limited to, infection, bleeding, blood clot (thrombus formation), and puncture of an artery; nerve damage and irregular heat beat.  Alternatives to this procedure were also discussed.  Consent obtained by Jake Samples, RN, daughter at bedside also agreeable to procedure and aware of all risks and benefits.   PICC/Midline Placement Documentation        [name], Megan Bang 01/03/2014, 3:43 PM

## 2014-01-03 NOTE — Progress Notes (Signed)
Heparin is located in the Pyxis machine in ED, Pharmacy does not send to ED.  Called ED twice to inform RN but no answer.

## 2014-01-03 NOTE — ED Notes (Signed)
Family updated on bed status.

## 2014-01-03 NOTE — Progress Notes (Addendum)
[facility] progress note. Chief complaint. Chest pressure. History of present illness. This 80 year old female recently admitted with complaints of chest pressure found to be in supraventricular tachycardia with arrival to the emergency room. Her rate was slowed down with adenosine and found flutter waves. Patient is now on a Cardizem drip and monitored in step down. She indicated nursing that she felt a sensation of chest tightness and shortness of breath. Apparently the patient had just finished ambulating to the bathroom and was noted briefly tachycardic in the 160 range. An EKG was obtained and I came to bedside to further evaluate the patient. EKG continues to show atrial flutter with a rate now approximately 120. EKG does not look significantly different than that obtained earlier in his hospitalization. Patient states she feels slight pain above the left breast to the left neck which she notes on inspiration but not at rest. Vital signs. Temperature 90.9, pulse 60, respiration 31, blood pressure 137/76. O2 sats 94%. General appearance. Well-developed elderly female who is alert and in no distress. Cardiac. Irregularly irregular. No pain with palpation over the chest or sternum. Abdomen. Soft and obese with positive bowel sounds. No pain. Impression/plan. Problem #1 chest pain. Patient's current EKG continues to show atrial flutter though without significant changes from earlier EKG. Patient has when necessary morphine in place for pain. I've added sublingual nitroglycerin every 5 minutes when necessary for chest pain but hold for systolic blood pressure less than 100. Patient has already on a heparin drip. She appears to be comfortable and in no distress. We'll use the morphine and nitroglycerin for any further symptoms. Nursing will also update cardiology on these developments. We'll follow for any abnormality in pending troponins. Will repeat an EKG in 6 hours.

## 2014-01-03 NOTE — Consult Note (Signed)
CARDIOLOGY CONSULT NOTE   Patient ID: [name] MRN: [id number] DOB/AGE: [birth year] 80 y.o.  Admit Date: 01/03/2014 Primary Physician: Megan Pao, MD Primary Cardiologist   the patient has been seen by cardiology in the past. Old records will have to be reviewed. I think she may have seen Dr. Acie [name]?   Clinical Summary [name] is a 80 y.o.female. There is a history of a supraventricular tachycardia in the past. I do not know what type. It was noted at the time of a colonoscopy in the past. From her history it sounds like she had an echo and an exercise test. It sounds like she converted spontaneously. We will try to find those records.  The patient has been active. She will this morning around 4 AM noticing an unusual sensation in her chest. She does describe it as feeling somewhat heavy with some radiation to the neck. She came to the emergency room with rapid SVT. With one dose of adenosine she slowed enough to see flutter waves. Cardizem was started. She has a continued vague sensation in her chest. However it may change with position and it may change with deep inspiration.   Allergies  Allergen Reactions  . Codeine     Medications Scheduled Medications:     Infusions: . diltiazem (CARDIZEM) infusion 10 mg/hr (01/03/14 0741)     PRN Medications:     Past Medical History  Diagnosis Date  . Hypertension   . Atrial fibrillation     Hx. of brief episode  . H/O Bell's palsy   . Cancer     breast,colon    Past Surgical History  Procedure Laterality Date  . Breast lumpectomy      left  . Colectomy      and ventral hernia repair with mesh placement  . Other surgical history      hysterectomy  . Abdominal hysterectomy    . Hernia repair    . Cataract extraction      Family history   Patient denies any family history of known significant ischemic heart disease or significant arrhythmias.  Social History [name] reports that she has  quit smoking. Her smoking use included Cigarettes. She smoked 0.00 packs per day. She does not have any smokeless tobacco history on file. [name] reports that she does not drink alcohol.  Review of Systems  Patient denies fever, chills, headache, sweats, rash, change in vision, change in hearing, cough, nausea vomiting, urinary symptoms. All other systems are reviewed and are negative.  Physical Examination Blood pressure 150/82, pulse 47, temperature 97.7 F (36.5 C), temperature source Oral, resp. rate 21, weight 192 lb (87.091 kg), SpO2 92.00%. No intake or output data in the 24 hours ending 01/03/14 7829  Patient is oriented to person time and place. Affect is normal. Her family is in the room with her. She has a vague sensation of heaviness in her chest. Head is atraumatic. Sclera and conjunctiva are normal. There is no jugulovenous distention. Lungs are clear. Respiratory effort is nonlabored. Cardiac exam reveals S1 and S2. The abdomen is soft. There is no peripheral edema. There are no musculoskeletal deformities. There are no skin rashes.  Prior Cardiac Testing/Procedures  Lab Results  Basic Metabolic Panel:  Recent Labs Lab 01/03/14 0628  NA 140  K 4.1  CL 100  CO2 23  GLUCOSE 150*  BUN 26*  CREATININE 1.02  CALCIUM 9.7    Liver Function Tests: No results found for  this basename: AST, ALT, ALKPHOS, BILITOT, PROT, ALBUMIN,  in the last 168 hours  CBC:  Recent Labs Lab 01/03/14 0628  WBC 12.0*  HGB 15.1*  HCT 45.2  MCV 92.8  PLT 170    Cardiac Enzymes: No results found for this basename: CKTOTAL, CKMB, CKMBINDEX, TROPONINI,  in the last 168 hours  BNP: No components found with this basename: POCBNP,    Radiology: Dg Chest Port 1 View  01/03/2014   CLINICAL DATA:  Upper chest tightness. History of breast and colon cancer.  EXAM: PORTABLE CHEST - 1 VIEW  COMPARISON:  02/28/2007  FINDINGS: Mild cardiac enlargement and pulmonary vascular congestion. No  edema or consolidation in the lungs. No blunting of costophrenic angles. Surgical clips in the left axilla. No pneumothorax.  IMPRESSION: Cardiac enlargement with mild pulmonary vascular congestion. No consolidation or edema.   Electronically Signed   By: Lucienne Capers M.D.   On: 01/03/2014 06:48     ECG:  The initial EKG from the emergency room revealed a rapid narrow complex rhythm. There are no diagnostic QRS changes but followup is needed. I have reviewed the patient's old EKG from 2011   Impression and Recommendations    H/O Bell's palsy    Atrial flutter     By history the patient had some type of supraventricular arrhythmia in the past. This may have been in 2011. We will need to obtain more records. She did see a cardiologist in the community. It appears that she converted spontaneously. Today she wakens with new onset rapid atrial flutter. The rate slowed with adenosine and she is now on diltiazem drip. The plan for now will be to continue IV diltiazem. I also decided to proceed with heparinization. If her enzymes are normal, she can be rapidly converted to an oral anticoagulant. We can then follow her rhythm and decide what the next steps would be over the next 24 hours.    Chest pressure     The patient does have a vague chest pressure. This may well be related to the arrhythmia. However cardiac enzymes are being checked. The first troponin is normal. I've chosen to use heparin at this point to be sure that we have the correct medicine on board if any type of cardiac intervention is needed. Repeat EKG will be done. If her enzymes are negative, she can be converted to an oral anticoagulant. One of the newer agents would be preferred in this situation.  Daryel November, MD 01/03/2014,

## 2014-01-03 NOTE — ED Notes (Signed)
Report given to Stephanie, RN.

## 2014-01-03 NOTE — ED Notes (Signed)
Pt states she awoke @ 1 hour ago with heaviness and tightness in chest radiating up to bilat neck and ears.

## 2014-01-03 NOTE — ED Provider Notes (Signed)
CSN: [id number]     Arrival date & time 01/03/14  0559 History   First MD Initiated Contact with Patient 01/03/14 [phone]     Chief Complaint  Patient presents with  . Palpitations     (Consider location/radiation/quality/duration/timing/severity/associated sxs/prior Treatment) HPI Comments: Patient is an 80 year old female with history of hypertension. She woke this morning approximately one hour prior to coming here with a sensation of palpitations and heaviness in her chest and neck. She denies any fevers or chills. She denies any swelling in her legs.  Patient is a 80 y.o. female presenting with palpitations. The history is provided by the patient.  Palpitations Palpitations quality:  Regular Onset quality:  Sudden Duration:  2 hours Timing:  Constant Progression:  Unchanged Chronicity:  New Relieved by:  Nothing Worsened by:  Nothing tried Ineffective treatments:  None tried Associated symptoms: no back pain and no chest pain     Past Medical History  Diagnosis Date  . Hypertension   . Atrial fibrillation     Hx. of brief episode  . H/O Bell's palsy   . Cancer     breast,colon   Past Surgical History  Procedure Laterality Date  . Breast lumpectomy      left  . Colectomy      and ventral hernia repair with mesh placement  . Other surgical history      hysterectomy  . Abdominal hysterectomy    . Hernia repair    . Cataract extraction     No family history on file. History  Substance Use Topics  . Smoking status: Former Smoker    Types: Cigarettes  . Smokeless tobacco: Not on file  . Alcohol Use: No   OB History   Grav Para Term Preterm Abortions TAB SAB Ect Mult Living                 Review of Systems  Cardiovascular: Positive for palpitations. Negative for chest pain.  Musculoskeletal: Negative for back pain.  All other systems reviewed and are negative.     Allergies  Codeine  Home Medications   Prior to Admission medications   Medication  Sig Start Date End Date Taking? Authorizing Provider  ALBUTEROL IN Inhale into the lungs. As needed    Historical Provider, MD  aspirin 81 MG tablet Take 81 mg by mouth daily.    Historical Provider, MD  diltiazem (CARDIZEM) 60 MG tablet Take 60 mg by mouth as needed.    Historical Provider, MD  Ibuprofen (ADVIL PO) Take by mouth. As needed    Historical Provider, MD  lisinopril (PRINIVIL,ZESTRIL) 20 MG tablet Take 20 mg by mouth daily.    Historical Provider, MD   BP 146/101  Pulse 143  Temp(Src) 97.7 F (36.5 C) (Oral)  Resp 20  Wt 192 lb (87.091 kg)  SpO2 97% Physical Exam  Nursing note and vitals reviewed. Constitutional: She is oriented to person, place, and time. She appears well-developed and well-nourished. No distress.  HENT:  Head: Normocephalic and atraumatic.  Neck: Normal range of motion. Neck supple.  Cardiovascular: Normal rate and regular rhythm.  Exam reveals no gallop and no friction rub.   No murmur heard. Heart is rapid but regular.  Pulmonary/Chest: Effort normal and breath sounds normal. No respiratory distress. She has no wheezes.  Abdominal: Soft. Bowel sounds are normal. She exhibits no distension. There is no tenderness.  Musculoskeletal: Normal range of motion.  Neurological: She is alert and oriented to person, place,  and time.  Skin: Skin is warm and dry. She is not diaphoretic.    ED Course  Procedures (including critical care time) Labs Review Labs Reviewed  [place], ED    Imaging Review No results found.   EKG Interpretation   Date/Time:  Saturday January 03 2014 06:05:45 EDT Ventricular Rate:  143 PR Interval:  86 QRS Duration: 75 QT Interval:  342 QTC Calculation: 527 R Axis:   -24 Text Interpretation:  Supraventricular tachycardia Borderline left axis  deviation Probable anteroseptal infarct, recent Confirmed by DELOS  MD,  Lonni Dirden ([id number]) on 01/03/2014 7:19:34  AM      MDM   Final diagnoses:  None    Patient presents with complaints of palpitations and chest tightness. Initial EKG reveals a supraventricular tachycardia with a rate of 143. It was regular and adenosine was given to differentiate between SVT and atrial flutter. Her rate slowed enough to reveal the underlying flutter waves. She was then started on a Cardizem drip and her rate significantly improved, however she remains in atrial flutter.  I discussed the case with Dr. Ron Parker from cardiology who will evaluate the patient but has requested admission to the hospitalist service. I've spoken with Dr. Charlies Silvers who agrees to admit.  CRITICAL CARE Performed by: Veryl Speak Total critical care time: 30 minutes Critical care time was exclusive of separately billable procedures and treating other patients. Critical care was necessary to treat or prevent imminent or life-threatening deterioration. Critical care was time spent personally by me on the following activities: development of treatment plan with patient and/or surrogate as well as nursing, discussions with consultants, evaluation of patient's response to treatment, examination of patient, obtaining history from patient or surrogate, ordering and performing treatments and interventions, ordering and review of laboratory studies, ordering and review of radiographic studies, pulse oximetry and re-evaluation of patient's condition.     Veryl Speak, MD 01/03/14 [phone]

## 2014-01-03 NOTE — Progress Notes (Signed)
Spoke with MD Claiborne Billings from cardiology and made aware patient currently sleeping, EKG looked similar to the previous one, and troponins have yet to result. Told him I spoke with Tom who asked to page cardiology to update on patient's status. Also made aware previous troponin drawn at 17:30 this afternoon was negative. No new orders placed but was requested to call back if troponins were elevated.

## 2014-01-03 NOTE — Progress Notes (Signed)
Echocardiogram 2D Echocardiogram has been performed.  [name] 01/03/2014, 12:11 PM

## 2014-01-03 NOTE — ED Notes (Signed)
Waiting on Dr. Charlies Silvers to call back.  Waiting on heparin from pharmacy.

## 2014-01-04 DIAGNOSIS — R7989 Other specified abnormal findings of blood chemistry: Secondary | ICD-10-CM | POA: Diagnosis present

## 2014-01-04 LAB — COMPREHENSIVE METABOLIC PANEL
ALT: 23 U/L (ref 0–35)
ANION GAP: 12 (ref 5–15)
AST: 16 U/L (ref 0–37)
Albumin: 3.6 g/dL (ref 3.5–5.2)
Alkaline Phosphatase: 60 U/L (ref 39–117)
BUN: 22 mg/dL (ref 6–23)
CALCIUM: 9 mg/dL (ref 8.4–10.5)
CO2: 24 meq/L (ref 19–32)
CREATININE: 0.85 mg/dL (ref 0.50–1.10)
Chloride: 104 mEq/L (ref 96–112)
GFR calc Af Amer: 71 mL/min — ABNORMAL LOW (ref >90)
GFR, EST NON AFRICAN AMERICAN: 61 mL/min — AB (ref >90)
Glucose, Bld: 142 mg/dL — ABNORMAL HIGH (ref 70–99)
Potassium: 3.8 mEq/L (ref 3.7–5.3)
Sodium: 140 mEq/L (ref 137–147)
Total Bilirubin: 2.7 mg/dL — ABNORMAL HIGH (ref 0.3–1.2)
Total Protein: 6.7 g/dL (ref 6.0–8.3)

## 2014-01-04 LAB — CBC
HCT: 38.4 % (ref 36.0–46.0)
HEMATOCRIT: 38.6 % (ref 36.0–46.0)
HEMOGLOBIN: 12.7 g/dL (ref 12.0–15.0)
Hemoglobin: 12.2 g/dL (ref 12.0–15.0)
MCH: 29.5 pg (ref 26.0–34.0)
MCH: 30 pg (ref 26.0–34.0)
MCHC: 31.8 g/dL (ref 30.0–36.0)
MCHC: 32.9 g/dL (ref 30.0–36.0)
MCV: 93 fL (ref 78.0–100.0)
MCV: 91.3 fL (ref 78.0–100.0)
PLATELETS: 149 10*3/uL — AB (ref 150–400)
Platelets: 152 10*3/uL (ref 150–400)
RBC: 4.13 MIL/uL (ref 3.87–5.11)
RBC: 4.23 MIL/uL (ref 3.87–5.11)
RDW: 13.9 % (ref 11.5–15.5)
RDW: 13.8 % (ref 11.5–15.5)
WBC: 11 10*3/uL — AB (ref 4.0–10.5)
WBC: 11.3 10*3/uL — ABNORMAL HIGH (ref 4.0–10.5)

## 2014-01-04 LAB — HEPARIN LEVEL (UNFRACTIONATED): Heparin Unfractionated: 0.42 IU/mL (ref 0.30–0.70)

## 2014-01-04 LAB — GLUCOSE, CAPILLARY: Glucose-Capillary: 129 mg/dL — ABNORMAL HIGH (ref 70–99)

## 2014-01-04 LAB — HEMOGLOBIN A1C
HEMOGLOBIN A1C: 6.2 % — AB (ref <5.7)
Mean Plasma Glucose: 131 mg/dL — ABNORMAL HIGH (ref <117)

## 2014-01-04 MED ORDER — AMIODARONE HCL IN DEXTROSE 360-4.14 MG/200ML-% IV SOLN
30.0000 mg/h | INTRAVENOUS | Status: DC
  Administered 2014-01-04 – 2014-01-05 (×2): 30 mg/h via INTRAVENOUS
  Filled 2014-01-04 (×3): qty 200

## 2014-01-04 MED ORDER — FUROSEMIDE 40 MG PO TABS
40.0000 mg | ORAL_TABLET | Freq: Once | ORAL | Status: AC
  Administered 2014-01-04: 40 mg via ORAL
  Filled 2014-01-04: qty 1

## 2014-01-04 MED ORDER — ALBUTEROL SULFATE (2.5 MG/3ML) 0.083% IN NEBU
2.5000 mg | INHALATION_SOLUTION | RESPIRATORY_TRACT | Status: DC | PRN
  Administered 2014-01-04: 2.5 mg via RESPIRATORY_TRACT
  Filled 2014-01-04: qty 3

## 2014-01-04 MED ORDER — LORAZEPAM 2 MG/ML IJ SOLN
0.5000 mg | Freq: Once | INTRAMUSCULAR | Status: AC
  Administered 2014-01-04: 0.5 mg via INTRAVENOUS

## 2014-01-04 MED ORDER — AMIODARONE HCL IN DEXTROSE 360-4.14 MG/200ML-% IV SOLN
60.0000 mg/h | INTRAVENOUS | Status: AC
  Administered 2014-01-04 (×2): 60 mg/h via INTRAVENOUS
  Filled 2014-01-04 (×2): qty 200

## 2014-01-04 MED ORDER — AMIODARONE LOAD VIA INFUSION
150.0000 mg | Freq: Once | INTRAVENOUS | Status: AC
  Administered 2014-01-04: 150 mg via INTRAVENOUS
  Filled 2014-01-04: qty 83.34

## 2014-01-04 MED ORDER — LORAZEPAM 2 MG/ML IJ SOLN
INTRAMUSCULAR | Status: AC
  Filled 2014-01-04: qty 1

## 2014-01-04 MED ORDER — APIXABAN 5 MG PO TABS
5.0000 mg | ORAL_TABLET | Freq: Two times a day (BID) | ORAL | Status: DC
  Administered 2014-01-04 – 2014-01-06 (×5): 5 mg via ORAL
  Filled 2014-01-04 (×6): qty 1

## 2014-01-04 NOTE — Progress Notes (Signed)
Patient ID: [name], female   DOB: 19-Nov-1927, 80 y.o.   MRN: [id number]    SUBJECTIVE:  2-D echo reveals good systolic function. The study is technically difficult. Therefore focal wall motion abnormalities cannot be ruled out completely. And troponins are normal. There is elevation of the BNP.   Filed Vitals:   01/04/14 0300 01/04/14 0400 01/04/14 0500 01/04/14 0600  BP: 148/65 141/89 160/62 143/73  Pulse: 63 73  101  Temp:  98.9 F (37.2 C)    TempSrc:  Oral    Resp: 20 21 17  32  Height:      Weight:      SpO2: 97% 98% 98% 100%     Intake/Output Summary (Last 24 hours) at 01/04/14 0757 Last data filed at 01/04/14 0600  Gross per 24 hour  Intake 309.27 ml  Output    800 ml  Net -490.73 ml    LABS: Basic Metabolic Panel:  Recent Labs  01/03/14 0628 01/03/14 1730 01/04/14 0515  NA 140 141 140  K 4.1 4.0 3.8  CL 100 103 104  CO2 23 24 24   GLUCOSE 150* 159* 142*  BUN 26* 21 22  CREATININE 1.02 0.85 0.85  CALCIUM 9.7 9.1 9.0  MG  --  1.9  --   PHOS  --  3.0  --    Liver Function Tests:  Recent Labs  01/03/14 1730 01/04/14 0515  AST 20 16  ALT 29 23  ALKPHOS 58 60  BILITOT 2.2* 2.7*  PROT 6.7 6.7  ALBUMIN 3.6 3.6   No results found for this basename: LIPASE, AMYLASE,  in the last 72 hours CBC:  Recent Labs  01/03/14 1730 01/04/14 0515  WBC 10.5 11.0*  NEUTROABS 8.4*  --   HGB 13.1 12.2  HCT 40.1 38.4  MCV 92.4 93.0  PLT 161 149*   Cardiac Enzymes:  Recent Labs  01/03/14 1730 01/03/14 2228  TROPONINI <0.30 <0.30   BNP: No components found with this basename: POCBNP,  D-Dimer: No results found for this basename: DDIMER,  in the last 72 hours Hemoglobin A1C: No results found for this basename: HGBA1C,  in the last 72 hours Fasting Lipid Panel: No results found for this basename: CHOL, HDL, LDLCALC, TRIG, CHOLHDL, LDLDIRECT,  in the last 72 hours Thyroid Function Tests:  Recent Labs  01/03/14 0630  TSH 4.710*     RADIOLOGY: Dg Chest Port 1 View  01/03/2014   CLINICAL DATA:  Evaluate line placement.  EXAM: PORTABLE CHEST - 1 VIEW  COMPARISON:  Chest x-ray earlier today and also 02/28/2007.  FINDINGS: There has been interval placement of a right-sided PICC line with tip overlying the region of the SVC just above the level of the carina. Lungs are adequately inflated without focal consolidation or effusion. There is mild prominence of the perihilar markings suggesting a mild degree of vascular congestion. Cardiomediastinal silhouette and remainder the exam is unchanged.  IMPRESSION: Suggestion of minimal vascular congestion.  Right-sided PICC line with tip overlying the SVC.   Electronically Signed   By: Marin Olp M.D.   On: 01/03/2014 16:51   Dg Chest Port 1 View  01/03/2014   CLINICAL DATA:  Upper chest tightness. History of breast and colon cancer.  EXAM: PORTABLE CHEST - 1 VIEW  COMPARISON:  02/28/2007  FINDINGS: Mild cardiac enlargement and pulmonary vascular congestion. No edema or consolidation in the lungs. No blunting of costophrenic angles. Surgical clips in the left axilla. No pneumothorax.  IMPRESSION:  Cardiac enlargement with mild pulmonary vascular congestion. No consolidation or edema.   Electronically Signed   By: Lucienne Capers M.D.   On: 01/03/2014 06:48    PHYSICAL EXAM  patient is flat in bed. She is oriented to person time and place. With her hearing aid in place she hears better. Her family was in the room. She's lying flat in bed. Head is atraumatic. Sclera and conjunctiva are normal. There is no jugulovenous distention. Lungs are clear. Respiratory effort is nonlabored. Cardiac exam reveals an S1 and S2. The rhythm is irregularly irregular. The abdomen is soft. There is no significant purple edema.   TELEMETRY: I personally reviewed telemetry today January 04, 2014. She has atrial flutter. Her rate is controlled at rest but there is significant increased rate when she tries to  ambulate. In this setting she has intermittent chest tightness.   ASSESSMENT AND PLAN:    Chest pressure      She had some chest pressure during the night. I believe that this is most likely related to increased rate with her rapid atrial fib. We will need to get her back in sinus before she is ready to go home.    Atrial flutter    Atrial flutter is persisting. We will need to convert her back to sinus before she goes home. I am switching her from heparin to Eliquis. She can take a full dose of 5 mg twice a day. It'll be helpful to get this drug fully on board. I've also decided to use IV amiodarone for rate control and possibly short-term posthospitalization. She has good left ventricular function. The most likely course will be to proceed with TEE cardioversion during the hospitalization. I will be seeing her again tomorrow to help with the timing of this.     Elevated brain natriuretic peptide (BNP) level     Her BNP was elevated on admission. This may be related to the rapid atrial flutter. I am going to give her one small dose of Lasix today.   Dola Argyle 01/04/2014 7:57 AM

## 2014-01-04 NOTE — Progress Notes (Signed)
ANTICOAGULATION CONSULT NOTE - F/u Consult  Pharmacy Consult for Heparin Indication: atrial fibrillation/flutter  Allergies  Allergen Reactions  . Codeine     Patient Measurements: Height: 5\' 5"  (165.1 cm) Weight: 195 lb 1.7 oz (88.5 kg) IBW/kg (Calculated) : 57  Vital Signs: Temp: 99 F (37.2 C) (09/05 2000) Temp src: Oral (09/05 2000) BP: 137/56 mmHg (09/05 1815)  Labs:  Recent Labs  01/03/14 0628 01/03/14 0630 01/03/14 1730 01/03/14 2228 01/04/14 0100  HGB 15.1*  --  13.1  --   --   HCT 45.2  --  40.1  --   --   PLT 170  --  161  --   --   APTT  --  27 26  --   --   LABPROT  --  13.3 14.6  --   --   INR  --  1.01 1.14  --   --   HEPARINUNFRC  --   --   --   --  0.42  CREATININE 1.02  --  0.85  --   --   TROPONINI  --   --  <0.30 <0.30  --     Estimated Creatinine Clearance: 54.1 ml/min (by C-G formula based on Cr of 0.85).   Medical History: Past Medical History  Diagnosis Date  . Hypertension   . Atrial fibrillation     Hx. of brief episode  . H/O Bell's palsy   . Cancer     breast,colon    Medications:  Scheduled:  . diltiazem  30 mg Oral Once  . levothyroxine  25 mcg Oral QAC breakfast  . sodium chloride  10-40 mL Intracatheter Q12H  . sodium chloride  3 mL Intravenous Q12H   Infusions:  . sodium chloride 10 mL/hr at 01/03/14 1147  . diltiazem (CARDIZEM) infusion 10 mg/hr (01/03/14 2300)  . heparin 1,100 Units/hr (01/03/14 1754)   PTA Meds: Aspirin 81mg  daily - took a dose prior to coming to ED Irbesartan 300mg  daily Levothyroxine 43mcg daily Ocuvite one tablet daily Ibruprofen 200mg  daily as needed (none taken in past week)   Assessment: 80 year old female presents to ED with palpitations and chest heaviness. EKG shows Supraventricular tachycardia. Cardiology was consulted and diltiazem drip was started and pharmacy is consulted to dose IV heparin.  9/6  1st HL = 0.42 units/ml  No problems reported per RN  Goal of Therapy:   Heparin level 0.3-0.7 units/ml Monitor platelets by anticoagulation protocol: Yes   Plan:   Continue heparin infusion @ 1100 units/hr  Recheck HL in 8 hours  Daily heparin level and CBC  Lawana Pai R 01/04/2014,2:38 AM

## 2014-01-04 NOTE — Progress Notes (Addendum)
Patient ID: [name], female   DOB: 1928/02/16, 80 y.o.   MRN: [id number] [facility] PROGRESS NOTE  [name] YCX:[id number] DOB: [birth year] DOA: 01/03/2014 PCP: Haywood Pao, MD  Brief narrative:  81 y.o.female with past medical history of hypertension, hypothyroidism who presented to [facility] ED 01/03/2014 with complaints of fluttering feeling in the chest on the morning of the admission. Patient felt chest tightness and heaviness around the neck but no radiation down to the arm. Pt also reported fatigue for past week or so.   In ED, heart rate was 143 and 12-lead EKG shows a supraventricular tachycardia. Please note that patient had a history of supraventricular tachycardia in the past and was given diltiazem by her primary care physician to take it only if needed but she hasn't taken that in last couple of years. In emergency room she received one dose of adenosine which slowed down the heart rate but it revealed atrial flutter. Cardiology saw the patient in consultation. She was started on Cardizem drip and per cardiology needs to be on heparin drip as well. Chest x-ray on admission showed mild pulmonary vascular congestion but no consolidation or edema.   Assessment & Plan   Principal problem:  Chest pain / SVT / atrial flutter  Appreciate cardiology consult and their recommendations.  Pt has received adenosine on admission but continued to have flutter. She was started on Cardizem drip and has required amiodarone drip as weil. Will follow up with cardio when we can convert to PO regimen.  Cardiac enzymes WNL. 2 D ECHO showed EF of 55% but technically difficult study due to atrial flutter.  On heparin drip for anticoagulation. Will be changed to PO per cardio.    Active problems:  Hypertension  On Cardizem drip. Hypothyroidism  Continue levothyroxine  TSH slightly high 4.710 but just outside of normal range. I think it is reasonable to have TSH repeated during next  office visit with PCP and make necessary adjustments then.  DVT prophylaxis:  On heparin drip    Code Status: Full  Family Communication: Plan of care discussed with the patient and her family at the bedside  Disposition Plan: remains in SDU   IV Access:   Peripheral IV PICC line Procedures and diagnostic studies:    Dg Chest Port 1 View  01/03/2014  Suggestion of minimal vascular congestion.  Right-sided PICC line with tip overlying the SVC.    Dg Chest Port 1 View 01/03/2014 Cardiac enlargement with mild pulmonary vascular congestion. No consolidation or edema.    Medical Consultants:   Cardiology (Dr. Dola Argyle) Other Consultants:   None  Anti-Infectives:    None   Leisa Lenz, MD  [facility] Pager [phone]  If 7PM-7AM, please contact night-coverage [email or web address] Password [email or web address] 01/04/2014, 12:24 PM   LOS: 1 day    HPI/Subjective: No acute overnight events.  Objective: Filed Vitals:   01/04/14 0800 01/04/14 0900 01/04/14 1000 01/04/14 1100  BP: 132/66 142/88 139/63   Pulse:      Temp: 98.7 F (37.1 C)     TempSrc: Oral     Resp: 25 25 24  44  Height:      Weight:      SpO2:  92% 96% 99%    Intake/Output Summary (Last 24 hours) at 01/04/14 1224 Last data filed at 01/04/14 1107  Gross per 24 hour  Intake 1166.17 ml  Output    800 ml  Net 366.17 ml    Exam:  General:  Pt is alert, follows commands appropriately, not in acute distress  Cardiovascular: tachycardic, irregular rhythm, S1/S2 (+)  Respiratory: Clear to auscultation bilaterally, no wheezing, no crackles, no rhonchi  Abdomen: Soft, non tender, non distended, bowel sounds present  Extremities: No edema, pulses DP and PT palpable bilaterally  Neuro: Grossly nonfocal  Data Reviewed: Basic Metabolic Panel:  Recent Labs Lab 01/03/14 0628 01/03/14 1730 01/04/14 0515  NA 140 141 140  K 4.1 4.0 3.8  CL 100 103 104  CO2 23 24 24   GLUCOSE 150* 159* 142*  BUN 26* 21 22   CREATININE 1.02 0.85 0.85  CALCIUM 9.7 9.1 9.0  MG  --  1.9  --   PHOS  --  3.0  --    Liver Function Tests:  Recent Labs Lab 01/03/14 1730 01/04/14 0515  AST 20 16  ALT 29 23  ALKPHOS 58 60  BILITOT 2.2* 2.7*  PROT 6.7 6.7  ALBUMIN 3.6 3.6   No results found for this basename: LIPASE, AMYLASE,  in the last 168 hours No results found for this basename: AMMONIA,  in the last 168 hours CBC:  Recent Labs Lab 01/03/14 0628 01/03/14 1730 01/04/14 0515  WBC 12.0* 10.5 11.0*  NEUTROABS  --  8.4*  --   HGB 15.1* 13.1 12.2  HCT 45.2 40.1 38.4  MCV 92.8 92.4 93.0  PLT 170 161 149*   Cardiac Enzymes:  Recent Labs Lab 01/03/14 1730 01/03/14 2228  TROPONINI <0.30 <0.30   BNP: No components found with this basename: POCBNP,  CBG:  Recent Labs Lab 01/04/14 0740  GLUCAP 129*    Recent Results (from the past 240 hour(s))  MRSA PCR SCREENING     Status: None   Collection Time    01/03/14 11:00 AM      Result Value Ref Range Status   MRSA by PCR NEGATIVE  NEGATIVE Final     Scheduled Meds: . apixaban  5 mg Oral BID  . diltiazem  30 mg Oral Once  . levothyroxine  25 mcg Oral QAC breakfast   Continuous Infusions: . amiodarone 60 mg/hr (01/04/14 0941)   Followed by  . amiodarone    . diltiazem (CARDIZEM) infusion Stopped (01/04/14 1010)

## 2014-01-04 NOTE — Progress Notes (Signed)
ANTICOAGULATION CONSULT NOTE - Initial Consult  Pharmacy Consult for Apixaban Indication: atrial fibrillation/flutter  Allergies  Allergen Reactions  . Codeine    Patient Measurements: Height: 5\' 5"  (165.1 cm) Weight: 193 lb 5.5 oz (87.7 kg) IBW/kg (Calculated) : 57  Vital Signs: Temp: 98.7 F (37.1 C) (09/06 0800) Temp src: Oral (09/06 0800) BP: 139/63 mmHg (09/06 1000) Pulse Rate: 49 (09/06 1000)  Labs:  Recent Labs  01/03/14 0628 01/03/14 0630 01/03/14 1730 01/03/14 2228 01/04/14 0100 01/04/14 0515  HGB 15.1*  --  13.1  --   --  12.2  HCT 45.2  --  40.1  --   --  38.4  PLT 170  --  161  --   --  149*  APTT  --  27 26  --   --   --   LABPROT  --  13.3 14.6  --   --   --   INR  --  1.01 1.14  --   --   --   HEPARINUNFRC  --   --   --   --  0.42  --   CREATININE 1.02  --  0.85  --   --  0.85  TROPONINI  --   --  <0.30 <0.30  --   --    Estimated Creatinine Clearance: 53.9 ml/min (by C-G formula based on Cr of 0.85).  Medical History: Past Medical History  Diagnosis Date  . Hypertension   . Atrial fibrillation     Hx. of brief episode  . H/O Bell's palsy   . Cancer     breast,colon   Medications:  Scheduled:  . apixaban  5 mg Oral BID  . diltiazem  30 mg Oral Once  . levothyroxine  25 mcg Oral QAC breakfast  . sodium chloride  10-40 mL Intracatheter Q12H  . sodium chloride  3 mL Intravenous Q12H   Infusions:  . sodium chloride 10 mL/hr at 01/03/14 1147  . amiodarone 60 mg/hr (01/04/14 0941)   Followed by  . amiodarone    . diltiazem (CARDIZEM) infusion Stopped (01/04/14 1010)  . heparin 1,100 Units/hr (01/03/14 1754)   Assessment: 80 year old female presented to ED 9/5 with palpitations and chest heaviness. EKG shows Supraventricular tachycardia. Cardiology was consulted and diltiazem drip was started and pharmacy was consulted to dose IV heparin-initiation of Heparin delayed for IV access.   1st HL = 0.42 units/ml on 1100 units/hr  Discontinue  Heparin - begin Apixaban (Eliquis) per Cards   Goal of Therapy:  Adjust Apixaban if renal function declines   Plan:   Discontinue Heparin at 1100 am  Begin Apixaban 5mg  bid at 1100 am  All Heparin levels discontinued  Minda Ditto PharmD Pager [phone] 01/04/2014, 10:33 AM

## 2014-01-05 LAB — URINALYSIS, ROUTINE W REFLEX MICROSCOPIC
Bilirubin Urine: NEGATIVE
GLUCOSE, UA: NEGATIVE mg/dL
Hgb urine dipstick: NEGATIVE
KETONES UR: NEGATIVE mg/dL
Leukocytes, UA: NEGATIVE
Nitrite: NEGATIVE
PROTEIN: NEGATIVE mg/dL
Specific Gravity, Urine: 1.011 (ref 1.005–1.030)
Urobilinogen, UA: 1 mg/dL (ref 0.0–1.0)
pH: 6 (ref 5.0–8.0)

## 2014-01-05 LAB — GLUCOSE, CAPILLARY: Glucose-Capillary: 140 mg/dL — ABNORMAL HIGH (ref 70–99)

## 2014-01-05 MED ORDER — HYDRALAZINE HCL 20 MG/ML IJ SOLN: 10.0000 mg | Freq: Four times a day (QID) | INTRAMUSCULAR | Status: DC | PRN

## 2014-01-05 MED ORDER — DILTIAZEM HCL 30 MG PO TABS
30.0000 mg | ORAL_TABLET | Freq: Once | ORAL | Status: AC
  Administered 2014-01-05: 30 mg via ORAL
  Filled 2014-01-05: qty 1

## 2014-01-05 NOTE — Progress Notes (Addendum)
Patient ID: [name], female   DOB: [birth year], 80 y.o.   MRN: [id number]    SUBJECTIVE:  Patient is stable this morning. She has many questions. I have explained to her that we need to proceed with TEE cardioversion. Unfortunately the timing is still unknown. I will push hard to get it done tomorrow.  Filed Vitals:   01/05/14 0300 01/05/14 0400 01/05/14 0500 01/05/14 0600  BP:  161/74  165/90  Pulse: 104 48 45 108  Temp:  98 F (36.7 C)    TempSrc:  Oral    Resp: 21 21 28 20   Height:      Weight:   194 lb 10.7 oz (88.3 kg)   SpO2: 93% 95% 92% 95%     Intake/Output Summary (Last 24 hours) at 01/05/14 0816 Last data filed at 01/05/14 0630  Gross per 24 hour  Intake 1309.47 ml  Output   1575 ml  Net -265.53 ml    LABS: Basic Metabolic Panel:  Recent Labs  01/03/14 0628 01/03/14 1730 01/04/14 0515  NA 140 141 140  K 4.1 4.0 3.8  CL 100 103 104  CO2 23 24 24   GLUCOSE 150* 159* 142*  BUN 26* 21 22  CREATININE 1.02 0.85 0.85  CALCIUM 9.7 9.1 9.0  MG  --  1.9  --   PHOS  --  3.0  --    Liver Function Tests:  Recent Labs  01/03/14 1730 01/04/14 0515  AST 20 16  ALT 29 23  ALKPHOS 58 60  BILITOT 2.2* 2.7*  PROT 6.7 6.7  ALBUMIN 3.6 3.6   No results found for this basename: LIPASE, AMYLASE,  in the last 72 hours CBC:  Recent Labs  01/03/14 1730 01/04/14 0515 01/04/14 1735  WBC 10.5 11.0* 11.3*  NEUTROABS 8.4*  --   --   HGB 13.1 12.2 12.7  HCT 40.1 38.4 38.6  MCV 92.4 93.0 91.3  PLT 161 149* 152   Cardiac Enzymes:  Recent Labs  01/03/14 1730 01/03/14 2228  TROPONINI <0.30 <0.30   BNP: No components found with this basename: POCBNP,  D-Dimer: No results found for this basename: DDIMER,  in the last 72 hours Hemoglobin A1C:  Recent Labs  01/03/14 1730  HGBA1C 6.2*   Fasting Lipid Panel: No results found for this basename: CHOL, HDL, LDLCALC, TRIG, CHOLHDL, LDLDIRECT,  in the last 72 hours Thyroid Function Tests:  Recent  Labs  01/03/14 0630  TSH 4.710*    RADIOLOGY: Dg Chest Port 1 View  01/03/2014   CLINICAL DATA:  Evaluate line placement.  EXAM: PORTABLE CHEST - 1 VIEW  COMPARISON:  Chest x-ray earlier today and also 02/28/2007.  FINDINGS: There has been interval placement of a right-sided PICC line with tip overlying the region of the SVC just above the level of the carina. Lungs are adequately inflated without focal consolidation or effusion. There is mild prominence of the perihilar markings suggesting a mild degree of vascular congestion. Cardiomediastinal silhouette and remainder the exam is unchanged.  IMPRESSION: Suggestion of minimal vascular congestion.  Right-sided PICC line with tip overlying the SVC.   Electronically Signed   By: Marin Olp M.D.   On: 01/03/2014 16:51   Dg Chest Port 1 View  01/03/2014   CLINICAL DATA:  Upper chest tightness. History of breast and colon cancer.  EXAM: PORTABLE CHEST - 1 VIEW  COMPARISON:  02/28/2007  FINDINGS: Mild cardiac enlargement and pulmonary vascular congestion. No edema or consolidation in  the lungs. No blunting of costophrenic angles. Surgical clips in the left axilla. No pneumothorax.  IMPRESSION: Cardiac enlargement with mild pulmonary vascular congestion. No consolidation or edema.   Electronically Signed   By: Lucienne Capers M.D.   On: 01/03/2014 06:48    PHYSICAL EXAM   patient is stable in bed. She is oriented to person time and place. Affect is normal. Lungs are clear. Respiratory effort is not labored. Cardiac exam reveals S1 and S2. The rhythm is irregular. The abdomen is soft. There is no peripheral edema.   TELEMETRY: I have reviewed telemetry today January 05, 2014. The patient has atrial flutter. The rate is controlled.   ASSESSMENT AND PLAN:    Chest pressure     The chest pressure that she had seemed associated with her increased heart rate from atrial flutter. LV function is good. I am not planning to assess this any further during  this hospitalization.    Atrial flutter     The patient has continued atrial flutter. I started IV amiodarone yesterday both for rate control and because I think it would be appropriate to consider sending her home on oral amiodarone for a short period of time. She is now on Eliquis. She is on 5 mg twice daily. Her renal function will have to be watched carefully. We need to proceed with TEE cardioversion while she is in the hospital. I am aware that the schedule is very busy. I will be pushing hard to see if our team can proceed with this procedure tomorrow. I've discussed this with the patient. I also discussed it with her family yesterday morning. I made her n.p.o. after midnight.   Her diet orders can then be adjusted further when we know more about scheduling tomorrow.     Elevated brain natriuretic peptide (BNP) level     I believe that the elevated BNP was related to her rapid rate. I gave her one dose of Lasix and there was no significant diuresis. Her volume status appears to be stable now.  I will be communicating with our team today to see what can be done about TEE scheduling. I will not be working tomorrow, but I can be reached by our team at any time on my cell phone.  Dola Argyle 01/05/2014 8:16 AM

## 2014-01-05 NOTE — Progress Notes (Addendum)
Patient ID: [name], female   DOB: 26-May-1927, 80 y.o.   MRN: [id number] [facility] PROGRESS NOTE  [name] [id number]:[id number] DOB: Apr 24, 1928 DOA: 01/03/2014 PCP: Haywood Pao, MD  Brief narrative: 80 y.o.female with past medical history of hypertension, hypothyroidism who presented to [facility] ED 01/03/2014 with complaints of fluttering feeling in the chest on the morning of the admission. Patient felt chest tightness and heaviness around the neck but no radiation down to the arm. Pt also reported fatigue for past week or so.  In ED, heart rate was 143 and 12-lead EKG shows a supraventricular tachycardia. Please note that patient had a history of supraventricular tachycardia in the past and was given diltiazem by her primary care physician to take it only if needed but she hasn't taken that in last couple of years. In emergency room she received one dose of adenosine which slowed down the heart rate but it revealed atrial flutter. Cardiology saw the patient in consultation. Pt required Cardizem drip on admission and AC with heparin drip which is now changed to apixaban.  Assessment & Plan   Principal problem:  Chest pain / SVT / atrial flutter  Appreciate cardiology  recommendations.  Pt has received adenosine on admission but continued to have flutter. She was started on Cardizem drip and has required amiodarone drip as well. Cardizem drip stopped and now on amiodarone drip only. Ac changed from heparin drip to apixaban.  Plan for cardioversion tomorrow per cardio. Will follow up with cardio when we can convert to PO regimen.  Cardiac enzymes WNL. 2 D ECHO showed EF of 55% but technically difficult study due to atrial flutter.  Active problems:  Acute encephalopathy  Unclear etiology. Patient improved with when necessary Ativan. Mental status is at baseline this morning. Hypertension  Give one dose of Cardizem 30 mg PO. May need to be started on Cardizem drip  again Hypothyroidism  Continue levothyroxine  TSH slightly high 4.710 but just outside of normal range. I think it is reasonable to have TSH repeated during next office visit with PCP and make necessary adjustments then.  DVT prophylaxis:  On apixaban   Code Status: Full  Family Communication: Plan of care discussed with the patient and her family at the bedside  Disposition Plan: remains in SDU    IV Access:   Peripheral IV  PICC line Procedures and diagnostic studies:   Dg Chest Port 1 View 01/03/2014 Suggestion of minimal vascular congestion. Right-sided PICC line with tip overlying the SVC.  Dg Chest Port 1 View 01/03/2014 Cardiac enlargement with mild pulmonary vascular congestion. No consolidation or edema.  Medical Consultants:   Cardiology (Dr. Dola Argyle) Other Consultants:   None  Anti-Infectives:   None    Leisa Lenz, MD  [facility] Pager [phone]  If 7PM-7AM, please contact night-coverage [email or web address] Password [email or web address] 01/05/2014, 8:11 AM   LOS: 2 days    HPI/Subjective: No acute overnight events.  Objective: Filed Vitals:   01/05/14 0300 01/05/14 0400 01/05/14 0500 01/05/14 0600  BP:  161/74  165/90  Pulse: 104 48 45 108  Temp:  98 F (36.7 C)    TempSrc:  Oral    Resp: 21 21 28 20   Height:      Weight:   88.3 kg (194 lb 10.7 oz)   SpO2: 93% 95% 92% 95%    Intake/Output Summary (Last 24 hours) at 01/05/14 0811 Last data filed at 01/05/14 0630  Gross per 24 hour  Intake 1309.47  ml  Output   1575 ml  Net -265.53 ml    Exam:   General:  Pt is not in distress  Cardiovascular: tachycardic, irregular rhythm, S1/S2 appreciated   Respiratory: Clear to auscultation bilaterally, no wheezing  Abdomen: Soft, non tender, non distended, bowel sounds present  Extremities: No edema, pulses DP and PT palpable bilaterally  Neuro: Grossly nonfocal  Data Reviewed: Basic Metabolic Panel:  Recent Labs Lab 01/03/14 0628 01/03/14 1730  01/04/14 0515  NA 140 141 140  K 4.1 4.0 3.8  CL 100 103 104  CO2 23 24 24   GLUCOSE 150* 159* 142*  BUN 26* 21 22  CREATININE 1.02 0.85 0.85  CALCIUM 9.7 9.1 9.0  MG  --  1.9  --   PHOS  --  3.0  --    Liver Function Tests:  Recent Labs Lab 01/03/14 1730 01/04/14 0515  AST 20 16  ALT 29 23  ALKPHOS 58 60  BILITOT 2.2* 2.7*  PROT 6.7 6.7  ALBUMIN 3.6 3.6   No results found for this basename: LIPASE, AMYLASE,  in the last 168 hours No results found for this basename: AMMONIA,  in the last 168 hours CBC:  Recent Labs Lab 01/03/14 0628 01/03/14 1730 01/04/14 0515 01/04/14 1735  WBC 12.0* 10.5 11.0* 11.3*  NEUTROABS  --  8.4*  --   --   HGB 15.1* 13.1 12.2 12.7  HCT 45.2 40.1 38.4 38.6  MCV 92.8 92.4 93.0 91.3  PLT 170 161 149* 152   Cardiac Enzymes:  Recent Labs Lab 01/03/14 1730 01/03/14 2228  TROPONINI <0.30 <0.30   BNP: No components found with this basename: POCBNP,  CBG:  Recent Labs Lab 01/04/14 0740  GLUCAP 129*    MRSA PCR SCREENING     Status: None   Collection Time    01/03/14 11:00 AM      Result Value Ref Range Status   MRSA by PCR NEGATIVE  NEGATIVE Final     Scheduled Meds: . apixaban  5 mg Oral BID  . diltiazem  30 mg Oral Once  . levothyroxine  25 mcg Oral QAC breakfast  . sodium chloride  10-40 mL Intracatheter Q12H  . sodium chloride  3 mL Intravenous Q12H   Continuous Infusions: . sodium chloride 10 mL/hr at 01/03/14 1147  . amiodarone 30 mg/hr (01/04/14 2040)  . diltiazem (CARDIZEM) infusion Stopped (01/04/14 1010)

## 2014-01-06 DIAGNOSIS — R799 Abnormal finding of blood chemistry, unspecified: Secondary | ICD-10-CM

## 2014-01-06 LAB — GLUCOSE, CAPILLARY: GLUCOSE-CAPILLARY: 127 mg/dL — AB (ref 70–99)

## 2014-01-06 MED ORDER — AMIODARONE HCL 200 MG PO TABS: 200.0000 mg | ORAL_TABLET | Freq: Every day | ORAL | Status: DC

## 2014-01-06 MED ORDER — AMIODARONE HCL 200 MG PO TABS
400.0000 mg | ORAL_TABLET | Freq: Two times a day (BID) | ORAL | Status: DC
  Administered 2014-01-06: 400 mg via ORAL
  Filled 2014-01-06: qty 2

## 2014-01-06 MED ORDER — AMIODARONE HCL 400 MG PO TABS: 400.0000 mg | ORAL_TABLET | Freq: Two times a day (BID) | ORAL | Status: DC

## 2014-01-06 MED ORDER — AMIODARONE HCL 200 MG PO TABS: 200.0000 mg | ORAL_TABLET | Freq: Two times a day (BID) | ORAL | Status: DC

## 2014-01-06 MED ORDER — APIXABAN 5 MG PO TABS: 5.0000 mg | ORAL_TABLET | Freq: Two times a day (BID) | ORAL | Status: DC

## 2014-01-06 NOTE — Progress Notes (Addendum)
Patient ID: [name], female   DOB: 1927-08-28, 80 y.o.   MRN: [id number]  SUBJECTIVE:   Chemically cardioverted o/n on Amiodarone IV Feels better this AM - just a bit weak having been in bed x 3 days.   Filed Vitals:   01/06/14 0300 01/06/14 0400 01/06/14 0500 01/06/14 0600  BP:  140/40  154/60  Pulse:  74 78 74  Temp:  98.8 F (37.1 C)    TempSrc:  Oral    Resp: 15 26 28 19   Height:      Weight:  198 lb 13.7 oz (90.2 kg)    SpO2:  99% 100% 97%     Intake/Output Summary (Last 24 hours) at 01/06/14 0742 Last data filed at 01/06/14 0600  Gross per 24 hour  Intake 1414.1 ml  Output   1450 ml  Net  -35.9 ml    LABS: Basic Metabolic Panel:  Recent Labs  01/03/14 1730 01/04/14 0515  NA 141 140  K 4.0 3.8  CL 103 104  CO2 24 24  GLUCOSE 159* 142*  BUN 21 22  CREATININE 0.85 0.85  CALCIUM 9.1 9.0  MG 1.9  --   PHOS 3.0  --    Liver Function Tests:  Recent Labs  01/03/14 1730 01/04/14 0515  AST 20 16  ALT 29 23  ALKPHOS 58 60  BILITOT 2.2* 2.7*  PROT 6.7 6.7  ALBUMIN 3.6 3.6   No results found for this basename: LIPASE, AMYLASE,  in the last 72 hours CBC:  Recent Labs  01/03/14 1730 01/04/14 0515 01/04/14 1735  WBC 10.5 11.0* 11.3*  NEUTROABS 8.4*  --   --   HGB 13.1 12.2 12.7  HCT 40.1 38.4 38.6  MCV 92.4 93.0 91.3  PLT 161 149* 152   Cardiac Enzymes:  Recent Labs  01/03/14 1730 01/03/14 2228  TROPONINI <0.30 <0.30   BNP: No components found with this basename: POCBNP,  D-Dimer: No results found for this basename: DDIMER,  in the last 72 hours Hemoglobin A1C:  Recent Labs  01/03/14 1730  HGBA1C 6.2*   Fasting Lipid Panel: No results found for this basename: CHOL, HDL, LDLCALC, TRIG, CHOLHDL, LDLDIRECT,  in the last 72 hours Thyroid Function Tests: No results found for this basename: TSH, T4TOTAL, FREET3, T3FREE, THYROIDAB,  in the last 72 hours  RADIOLOGY:  No new.  PHYSICAL EXAM   General appearance: alert,  cooperative, appears older than stated age and no distress Neck: no adenopathy, no carotid bruit and no JVD Lungs: clear to auscultation bilaterally and normal percussion bilaterally Heart: regular rate and rhythm, S1, S2 normal, no murmur, click, rub or gallop and normal apical impulse Abdomen: soft, non-tender; bowel sounds normal; no masses,  no organomegaly Extremities: extremities normal, atraumatic, no cyanosis or edema Pulses: 2+ and symmetric Neurologic: Grossly normal ; R facial droop from Bells Palsy.  2-D ECHO: s good systolic function. The study is technically difficult. Therefore focal wall motion abnormalities cannot be ruled out completely TELEMETRY: I personally reviewed telemetry 01/06/2014. She has converted to NSR ~1:30 AM today. Now rates in ~80s with NSR.  ASSESSMENT AND PLAN: Principal Problem:   Atrial flutter Active Problems:   Chest pressure   Elevated brain natriuretic peptide (BNP) level   H/O Bell's palsy       Atrial flutter: Persistent.  Now s/p Chemical Cardioversion with Amiodarone   Continue AC with 5mg  BID Eliquis  Converting to Amiodarone PO (orders written) -- 400mg  bid  x 5d (10 doses), 200mg  bid x 5 d then maintenance dose of 200 mg daily (until seen by primary cardiologist)  No need to pursue TEE DCCV    Chest pressure      She had some chest pressure during the night. I believe that this is most likely related to increased rate with her rapid atrial fib. We will need to get her back in sinus before she is ready to go home.    Elevated brain natriuretic peptide (BNP) level - related to Aflutter with RVR.  No futher SSx of CHF.  As she has now cardioverted to NSR - would need to ensure that she is stable ambulating & able to take PO meds (she is very concerned about dysphagia).  If able to take meds & stable ambulating, should be OK for d/c home with plan for close Cardiology f/u.  If unable to take Amiodarone (for rate & rhythm control) would  try BID 90 mg Diltiazem.  ~45 min spent with Pt & daughter discussing Rx course & plans for OP management.     HARDING,DAVID W 01/06/2014 7:42 AM

## 2014-01-06 NOTE — Progress Notes (Signed)
Patient ID: [name], female   DOB: 22-Aug-1927, 80 y.o.   MRN: [id number] [facility] PROGRESS NOTE  [name] [id number]:[id number] DOB: 09/28/1927 DOA: 01/03/2014 PCP: Haywood Pao, MD  Brief narrative: 80 y.o.female with past medical history of hypertension, hypothyroidism who presented to [facility] ED 01/03/2014 with complaints of fluttering feeling in the chest on the morning of the admission. Patient felt chest tightness and heaviness around the neck but no radiation down to the arm. Pt also reported fatigue for past week or so.  In ED, heart rate was 143 and 12-lead EKG shows a supraventricular tachycardia. Please note that patient had a history of supraventricular tachycardia in the past and was given diltiazem by her primary care physician to take it only if needed but she hasn't taken that in last couple of years. In emergency room she received one dose of adenosine which slowed down the heart rate but it revealed atrial flutter. Cardiology saw the patient in consultation. Pt required Cardizem and amiodarone drip on admission and AC with heparin drip which is now changed to apixaban.   Assessment & Plan   Principal problem:  Chest pain / SVT / atrial flutter  Appreciate cardiology recommendations.  Pt has received adenosine on admission but continued to have flutter. She was started on Cardizem drip and has required amiodarone drip as well.  Plan initially for TEE but heart rate controlled (74-89) so pt transitioned to PO amiodarone. Patient is on apixaban for anticoagulation. Cardiac enzymes WNL. 2 D ECHO showed EF of 55% but technically difficult study due to atrial flutter.  Active problems:  Acute encephalopathy   Unclear etiology. Patient improved with when necessary Ativan. Mental status is at baseline. Hypertension  As mentioned above pt was on Cardizem and amiodarone drip. She is now on amiodarone PO. Hypothyroidism  Continue levothyroxine  TSH slightly high 4.710  but just outside of normal range. I think it is reasonable to have TSH repeated during next office visit with PCP and make necessary adjustments then.  DVT prophylaxis:  On apixaban   Code Status: Full  Family Communication: Plan of care discussed with the patient and her family at the bedside  Disposition Plan: may possibly go home today    IV Access:   Peripheral IV  PICC line Procedures and diagnostic studies:   Dg Chest Port 1 View 01/03/2014 Suggestion of minimal vascular congestion. Right-sided PICC line with tip overlying the SVC.  Dg Chest Port 1 View 01/03/2014 Cardiac enlargement with mild pulmonary vascular congestion. No consolidation or edema.  Medical Consultants:   Cardiology (Dr. Dola Argyle) Other Consultants:   None  Anti-Infectives:   None     Leisa Lenz, MD  [facility] Pager [phone]  If 7PM-7AM, please contact night-coverage [email or web address] Password [facility] 01/06/2014, 8:19 AM   LOS: 3 days    HPI/Subjective: No acute overnight events.  Objective: Filed Vitals:   01/06/14 0300 01/06/14 0400 01/06/14 0500 01/06/14 0600  BP:  140/40  154/60  Pulse:  74 78 74  Temp:  98.8 F (37.1 C)    TempSrc:  Oral    Resp: 15 26 28 19   Height:      Weight:  90.2 kg (198 lb 13.7 oz)    SpO2:  99% 100% 97%    Intake/Output Summary (Last 24 hours) at 01/06/14 0819 Last data filed at 01/06/14 0600  Gross per 24 hour  Intake 1147.4 ml  Output   1200 ml  Net  -52.6  ml    Exam:   General:  Pt is alert, no distress   Cardiovascular: Regular rate and rhythm, S1/S2 appreciated   Respiratory: Clear to auscultation bilaterally, no wheezing  Abdomen: Soft, non tender, non distended, bowel sounds present  Extremities: No edema, pulses DP and PT palpable bilaterally  Neuro: Grossly nonfocal  Data Reviewed: Basic Metabolic Panel:  Recent Labs Lab 01/03/14 0628 01/03/14 1730 01/04/14 0515  NA 140 141 140  K 4.1 4.0 3.8  CL 100 103 104  CO2 23  24 24   GLUCOSE 150* 159* 142*  BUN 26* 21 22  CREATININE 1.02 0.85 0.85  CALCIUM 9.7 9.1 9.0  MG  --  1.9  --   PHOS  --  3.0  --    Liver Function Tests:  Recent Labs Lab 01/03/14 1730 01/04/14 0515  AST 20 16  ALT 29 23  ALKPHOS 58 60  BILITOT 2.2* 2.7*  PROT 6.7 6.7  ALBUMIN 3.6 3.6   No results found for this basename: LIPASE, AMYLASE,  in the last 168 hours No results found for this basename: AMMONIA,  in the last 168 hours CBC:  Recent Labs Lab 01/03/14 0628 01/03/14 1730 01/04/14 0515 01/04/14 1735  WBC 12.0* 10.5 11.0* 11.3*  NEUTROABS  --  8.4*  --   --   HGB 15.1* 13.1 12.2 12.7  HCT 45.2 40.1 38.4 38.6  MCV 92.8 92.4 93.0 91.3  PLT 170 161 149* 152   Cardiac Enzymes:  Recent Labs Lab 01/03/14 1730 01/03/14 2228  TROPONINI <0.30 <0.30   BNP: No components found with this basename: POCBNP,  CBG:  Recent Labs Lab 01/04/14 0740 01/05/14 0815  GLUCAP 129* 140*    Recent Results (from the past 240 hour(s))  MRSA PCR SCREENING     Status: None   Collection Time    01/03/14 11:00 AM      Result Value Ref Range Status   MRSA by PCR NEGATIVE  NEGATIVE Final     Scheduled Meds: . amiodarone  400 mg Oral BID   Followed by  . [START ON 01/11/2014] amiodarone  200 mg Oral BID   Followed by  . [START ON 01/16/2014] amiodarone  200 mg Oral Daily  . apixaban  5 mg Oral BID  . diltiazem  30 mg Oral Once  . levothyroxine  25 mcg Oral QAC breakfast   Continuous Infusions: . sodium chloride 10 mL/hr at 01/03/14 1147

## 2014-01-06 NOTE — Progress Notes (Signed)
Patient and daughter verbalized understanding of discharge instructions. Patient was given prescriptions and discharge forms. Patient is stable at discharge.

## 2014-01-06 NOTE — Discharge Instructions (Addendum)
Atrial Flutter °Atrial flutter is a heart rhythm that can cause the heart to beat very fast (tachycardia). It originates in the upper chambers of the heart (atria). In atrial flutter, the top chambers of the heart (atria) often beat much faster than the bottom chambers of the heart (ventricles). Atrial flutter has a regular "saw toothed" appearance in an EKG readout. An EKG is a test that records the electrical activity of the heart. Atrial flutter can cause the heart to beat up to 150 beats per minute (BPM). Atrial flutter can either be short lived (paroxysmal) or permanent.  °CAUSES  °Causes of atrial flutter can be many. Some of these include: °· Heart related issues: °¨ Heart attack (myocardial infarction). °¨ Heart failure. °¨ Heart valve problems. °¨ Poorly controlled high blood pressure (hypertension). °¨ After open heart surgery. °· Lung related issues: °¨ A blood clot in the lungs (pulmonary embolism). °¨ Chronic obstructive pulmonary disease (COPD). Medications used to treat COPD can attribute to atrial flutter. °· Other related causes: °¨ Hyperthyroidism. °¨ Caffeine. °¨ Some decongestant cold medications. °¨ Low electrolyte levels such as potassium or magnesium. °¨ Cocaine. °SYMPTOMS °· An awareness of your heart beating rapidly (palpitations). °· Shortness of breath. °· Chest pain. °· Low blood pressure (hypotension). °· Dizziness or fainting. °DIAGNOSIS  °Different tests can be performed to diagnose atrial flutter.  °· An EKG. °· Holter monitor. This is a 24-hour recording of your heart rhythm. You will also be given a diary. Write down all symptoms that you have and what you were doing at the time you experienced symptoms. °· Cardiac event monitor. This small device can be worn for up to 30 days. When you have heart symptoms, you will push a button on the device. This will then record your heart rhythm. °· Echocardiogram. This is an imaging test to look at your heart. Your caregiver will look at your  heart valves and the ventricles. °· Stress test. This test can help determine if the atrial flutter is related to exercise or if coronary artery disease is present. °· Laboratory studies will look at certain blood levels like: °¨ Complete blood count (CBC). °¨ Potassium. °¨ Magnesium. °¨ Thyroid function. °TREATMENT  °Treatment of atrial flutter varies. A combination of therapies may be used or sometimes atrial flutter may need only 1 type of treatment.  °Lab work: °If your blood work, such as your electrolytes (potassium, magnesium) or your thyroid function tests, are abnormal, your caregiver will treat them accordingly.  °Medication:  °There are several different types of medications that can convert your heart to a normal rhythm and prevent atrial flutter from reoccurring.  °Nonsurgical procedures: °Nonsurgical techniques may be used to control atrial flutter. Some examples include: °· Cardioversion. This technique uses either drugs or an electrical shock to restore a normal heart rhythm: °¨ Cardioversion drugs may be given through an intravenous (IV) line to help "reset" the heart rhythm. °¨ In electrical cardioversion, your caregiver shocks your heart with electrical energy. This helps to reset the heartbeat to a normal rhythm. °· Ablation. If atrial flutter is a persistent problem, an ablation may be needed. This procedure is done under mild sedation. High frequency radio-wave energy is used to destroy the area of heart tissue responsible for atrial flutter. °SEEK IMMEDIATE MEDICAL CARE IF:  °You have: °· Dizziness. °· Near fainting or fainting. °· Shortness of breath. °· Chest pain or pressure. °· Sudden nausea or vomiting. °· Profuse sweating. °If you have the above symptoms,   call your local emergency service immediately! Do not drive yourself to the hospital. MAKE SURE YOU:   Understand these instructions.  Will watch your condition.  Will get help right away if you are not doing well or get  worse. Document Released: 09/03/2008 Document Revised: 09/01/2013 Document Reviewed: 09/03/2008 [facility] Patient Information 2015 [place], [place]. This information is not intended to replace advice given to you by your health care provider. Make sure you discuss any questions you have with your health care provider.  Information on my medicine - ELIQUIS (apixaban)  This medication education was reviewed with me or my healthcare representative as part of my discharge preparation.  The pharmacist that spoke with me during my hospital stay was:  Crystal Scarberry A, RPH  Why was Eliquis prescribed for you? Eliquis was prescribed for you to reduce the risk of a blood clot forming that can cause a stroke if you have a medical condition called atrial fibrillation (a type of irregular heartbeat).  What do You need to know about Eliquis ? Take your Eliquis TWICE DAILY - one tablet in the morning and one tablet in the evening with or without food. If you have difficulty swallowing the tablet whole please discuss with your pharmacist how to take the medication safely.  Take Eliquis exactly as prescribed by your doctor and DO NOT stop taking Eliquis without talking to the doctor who prescribed the medication.  Stopping may increase your risk of developing a stroke.  Refill your prescription before you run out.  After discharge, you should have regular check-up appointments with your healthcare provider that is prescribing your Eliquis.  In the future your dose may need to be changed if your kidney function or weight changes by a significant amount or as you get older.  What do you do if you miss a dose? If you miss a dose, take it as soon as you remember on the same day and resume taking twice daily.  Do not take more than one dose of ELIQUIS at the same time to make up a missed dose.  Important Safety Information A possible side effect of Eliquis is bleeding. You should call your healthcare provider  right away if you experience any of the following:   Bleeding from an injury or your nose that does not stop.   Unusual colored urine (red or dark brown) or unusual colored stools (red or black).   Unusual bruising for unknown reasons.   A serious fall or if you hit your head (even if there is no bleeding).  Some medicines may interact with Eliquis and might increase your risk of bleeding or clotting while on Eliquis. To help avoid this, consult your healthcare provider or pharmacist prior to using any new prescription or non-prescription medications, including herbals, vitamins, non-steroidal anti-inflammatory drugs (NSAIDs) and supplements.  This website has more information on Eliquis (apixaban): http://www.eliquis.com/eliquis/home

## 2014-01-06 NOTE — Discharge Summary (Signed)
Physician Discharge Summary  [name] INO:[id number] DOB: 10-12-1927 DOA: 01/03/2014  PCP: Haywood Pao, MD  Admit date: 01/03/2014 Discharge date: 01/06/2014  Recommendations for Outpatient Follow-up:  1. continue amiodarone 400 mg twice daily for 5 days followed by amiodarone 200 mg twice daily for 5 days followed by amiodarone 200 mg daily indefinitely. 2. continue apixaban 5 mg twice daily for anticoagulation. 3. followup with cardiology per scheduled appointment with Dr. Ron Parker 4. you may resume avapro at home. 5. your TSH level was 4.71, slightly above normal range. Please have your TSH rechecked by primary care physician during next appointment to make sure that this is correct. For now continue current Synthroid dose.  Discharge Diagnoses:  Principal Problem:   Atrial flutter Active Problems:   Chest pressure   H/O Bell's palsy   Elevated brain natriuretic peptide (BNP) level    Discharge Condition: stable   Diet recommendation: as tolerated   History of present illness:  80 y.o.female with past medical history of hypertension, hypothyroidism who presented to [facility] ED 01/03/2014 with complaints of fluttering feeling in the chest on the morning of the admission. Patient felt chest tightness and heaviness around the neck but no radiation down to the arm. Pt also reported fatigue for past week or so.  In ED, heart rate was 143 and 12-lead EKG shows a supraventricular tachycardia. Please note that patient had a history of supraventricular tachycardia in the past and was given diltiazem by her primary care physician to take it only if needed but she hasn't taken that in last couple of years. In emergency room she received one dose of adenosine which slowed down the heart rate but it revealed atrial flutter. Cardiology saw the patient in consultation. Pt required Cardizem and amiodarone drip on admission and AC with heparin drip which is now changed to apixaban.    Assessment &  Plan   Principal problem:  Chest pain / SVT / atrial flutter  Appreciate cardiology recommendations.  Pt has received adenosine on admission but continued to have flutter. She was started on Cardizem drip and has required amiodarone drip as well.  Plan initially for TEE but heart rate controlled (74-89) so pt transitioned to PO amiodarone. Patient will take amiodarone as prescribed, 400 mg twice daily for 5 days followed by 200 mg twice daily for 5 days and then 200 mg daily indefinitely. Patient is on apixaban for anticoagulation.  Cardiac enzymes WNL. 2 D ECHO showed EF of 55% but technically difficult study due to atrial flutter.  Active problems:  Acute encephalopathy   Unclear etiology. Patient improved with when necessary Ativan. Mental status is at baseline. Hypertension  Resume home blood pressure medication. Hypothyroidism  Continue levothyroxine  TSH slightly high 4.710 but just outside of normal range. I think it is reasonable to have TSH repeated during next office visit with PCP and make necessary adjustments then.  DVT prophylaxis:  On apixaban   Code Status: Full  Family Communication: Plan of care discussed with the patient and her family at the bedside   IV Access:   Peripheral IV  PICC line Procedures and diagnostic studies:   Dg Chest Port 1 View 01/03/2014 Suggestion of minimal vascular congestion. Right-sided PICC line with tip overlying the SVC.  Dg Chest Port 1 View 01/03/2014 Cardiac enlargement with mild pulmonary vascular congestion. No consolidation or edema.  Medical Consultants:   Cardiology (Dr. Dola Argyle) Other Consultants:   None  Anti-Infectives:   None  Signed:  Leisa Lenz, MD  [facility] 01/06/2014, 10:56 AM  Pager #: [phone]   Discharge Exam: Filed Vitals:   01/06/14 0800  BP:   Pulse:   Temp: 97.4 F (36.3 C)  Resp:    Filed Vitals:   01/06/14 0400 01/06/14 0500 01/06/14 0600 01/06/14 0800  BP: 140/40  154/60    Pulse: 74 78 74   Temp: 98.8 F (37.1 C)   97.4 F (36.3 C)  TempSrc: Oral   Oral  Resp: 26 28 19    Height:      Weight: 90.2 kg (198 lb 13.7 oz)     SpO2: 99% 100% 97%     General: Pt is alert, follows commands appropriately, not in acute distress Cardiovascular: Regular rate and rhythm, S1/S2 +, no murmurs Respiratory: Clear to auscultation bilaterally, no wheezing, no crackles, no rhonchi Abdominal: Soft, non tender, non distended, bowel sounds +, no guarding Extremities: no edema, no cyanosis, pulses palpable bilaterally DP and PT Neuro: Grossly nonfocal  Discharge Instructions  Discharge Instructions   Call MD for:  difficulty breathing, headache or visual disturbances    Complete by:  As directed      Call MD for:  persistant dizziness or light-headedness    Complete by:  As directed      Call MD for:  persistant nausea and vomiting    Complete by:  As directed      Call MD for:  severe uncontrolled pain    Complete by:  As directed      Diet - low sodium heart healthy    Complete by:  As directed      Discharge instructions    Complete by:  As directed   1. continue amiodarone 400 mg twice daily for 5 days followed by amiodarone 200 mg twice daily for 5 days followed by amiodarone 200 mg daily indefinitely. 2. continue apixaban 5 mg twice daily for anticoagulation. 3. followup with cardiology per scheduled appointment with Dr. Ron Parker 4. you may resume avapro at home. 5. your TSH level was 4.71, slightly above normal range. Please have your TSH rechecked by primary care physician during next appointment to make sure that this is correct. For now continue current Synthroid dose.     Increase activity slowly    Complete by:  As directed             Medication List    STOP taking these medications       aspirin 81 MG tablet      TAKE these medications       ADVIL PO  Take 200 mg by mouth daily as needed. For pain     amiodarone 400 MG tablet  Commonly  known as:  PACERONE  Take 1 tablet (400 mg total) by mouth 2 (two) times daily.     amiodarone 200 MG tablet  Commonly known as:  PACERONE  Take 1 tablet (200 mg total) by mouth 2 (two) times daily.  Start taking on:  01/11/2014     amiodarone 200 MG tablet  Commonly known as:  PACERONE  Take 1 tablet (200 mg total) by mouth daily.  Start taking on:  01/16/2014     apixaban 5 MG Tabs tablet  Commonly known as:  ELIQUIS  Take 1 tablet (5 mg total) by mouth 2 (two) times daily.     beta carotene w/minerals tablet  Take 1 tablet by mouth daily.     irbesartan 300 MG tablet  Commonly known as:  AVAPRO  Take 300 mg by mouth daily.     levothyroxine 25 MCG tablet  Commonly known as:  SYNTHROID, LEVOTHROID  Take 25 mcg by mouth daily before breakfast.           Follow-up Information   Follow up with Dola Argyle, MD. Schedule an appointment as soon as possible for a visit in 2 weeks. (Follow up appt after recent hospitalization)    Specialty:  Cardiology   Contact information:   [address] [place] [address] [phone]       Follow up with Haywood Pao, MD. Schedule an appointment as soon as possible for a visit in 1 week. (Follow up appt after recent hospitalization)    Specialty:  Internal Medicine   Contact information:   [place] [address] [phone]        The results of significant diagnostics from this hospitalization (including imaging, microbiology, ancillary and laboratory) are listed below for reference.    Significant Diagnostic Studies: Dg Chest Port 1 View  01/03/2014   CLINICAL DATA:  Evaluate line placement.  EXAM: PORTABLE CHEST - 1 VIEW  COMPARISON:  Chest x-ray earlier today and also 02/28/2007.  FINDINGS: There has been interval placement of a right-sided PICC line with tip overlying the region of the SVC just above the level of the carina. Lungs are adequately inflated without focal consolidation or  effusion. There is mild prominence of the perihilar markings suggesting a mild degree of vascular congestion. Cardiomediastinal silhouette and remainder the exam is unchanged.  IMPRESSION: Suggestion of minimal vascular congestion.  Right-sided PICC line with tip overlying the SVC.   Electronically Signed   By: Marin Olp M.D.   On: 01/03/2014 16:51   Dg Chest Port 1 View  01/03/2014   CLINICAL DATA:  Upper chest tightness. History of breast and colon cancer.  EXAM: PORTABLE CHEST - 1 VIEW  COMPARISON:  02/28/2007  FINDINGS: Mild cardiac enlargement and pulmonary vascular congestion. No edema or consolidation in the lungs. No blunting of costophrenic angles. Surgical clips in the left axilla. No pneumothorax.  IMPRESSION: Cardiac enlargement with mild pulmonary vascular congestion. No consolidation or edema.   Electronically Signed   By: Lucienne Capers M.D.   On: 01/03/2014 06:48    Microbiology: Recent Results (from the past 240 hour(s))  MRSA PCR SCREENING     Status: None   Collection Time    01/03/14 11:00 AM      Result Value Ref Range Status   MRSA by PCR NEGATIVE  NEGATIVE Final   Comment:            The GeneXpert MRSA Assay (FDA     approved for NASAL specimens     only), is one component of a     comprehensive MRSA colonization     surveillance program. It is not     intended to diagnose MRSA     infection nor to guide or     monitor treatment for     MRSA infections.     Labs: Basic Metabolic Panel:  Recent Labs Lab 01/03/14 0628 01/03/14 1730 01/04/14 0515  NA 140 141 140  K 4.1 4.0 3.8  CL 100 103 104  CO2 23 24 24   GLUCOSE 150* 159* 142*  BUN 26* 21 22  CREATININE 1.02 0.85 0.85  CALCIUM 9.7 9.1 9.0  MG  --  1.9  --   PHOS  --  3.0  --    Liver Function Tests:  Recent Labs Lab 01/03/14 1730 01/04/14 0515  AST 20 16  ALT 29 23  ALKPHOS 58 60  BILITOT 2.2* 2.7*  PROT 6.7 6.7  ALBUMIN 3.6 3.6   No results found for this basename: LIPASE, AMYLASE,   in the last 168 hours No results found for this basename: AMMONIA,  in the last 168 hours CBC:  Recent Labs Lab 01/03/14 0628 01/03/14 1730 01/04/14 0515 01/04/14 1735  WBC 12.0* 10.5 11.0* 11.3*  NEUTROABS  --  8.4*  --   --   HGB 15.1* 13.1 12.2 12.7  HCT 45.2 40.1 38.4 38.6  MCV 92.8 92.4 93.0 91.3  PLT 170 161 149* 152   Cardiac Enzymes:  Recent Labs Lab 01/03/14 1730 01/03/14 2228  TROPONINI <0.30 <0.30   BNP: BNP (last 3 results)  Recent Labs  01/03/14 0628  PROBNP 838.2*   CBG:  Recent Labs Lab 01/04/14 0740 01/05/14 0815 01/06/14 0823  GLUCAP 129* 140* 127*    Time coordinating discharge: Over 30 minutes

## 2014-01-06 NOTE — Progress Notes (Signed)
Per MD order, PICC line removed. Cath intact at 38cm. Vaseline pressure gauze to site, pressure held x 5min. No bleeding to site. Pt instructed to keep dressing CDI x 24 hours. Avoid heavy lifting, pushing or pulling x 24 hours,  If bleeding occurs hold pressure, if bleeding does not stop contact MD or go to the ED. Pt does not have any questions. Randen Kauth M  

## 2014-01-06 NOTE — Progress Notes (Signed)
Dr Charlies Silvers paged. Pharmacy has told daughter that Eliquis has to be pre-approved before pick up which involves paperwork faxed to MD and MD fills out and sends back. Pharmacy states usually PCP follows up with this. Awaiting page form Dr Charlies Silvers. Call returned. [facility] called for Insurance number. [phone]. Pt ID [id number] for Medicare PDP. Return call to [facility] to okay filling Eliquis once Ins called. Thank you

## 2014-01-07 LAB — URINE CULTURE: Colony Count: 15000

## 2014-01-21 ENCOUNTER — Encounter: Payer: Self-pay | Admitting: Cardiology

## 2014-01-21 DIAGNOSIS — R9430 Abnormal result of cardiovascular function study, unspecified: Secondary | ICD-10-CM | POA: Insufficient documentation

## 2014-01-23 ENCOUNTER — Ambulatory Visit (INDEPENDENT_AMBULATORY_CARE_PROVIDER_SITE_OTHER): Payer: Medicare Other | Admitting: Cardiology

## 2014-01-23 ENCOUNTER — Encounter: Payer: Self-pay | Admitting: Cardiology

## 2014-01-23 VITALS — BP 136/98 | HR 134 | Ht 65.0 in | Wt 185.1 lb

## 2014-01-23 DIAGNOSIS — Z7901 Long term (current) use of anticoagulants: Secondary | ICD-10-CM | POA: Insufficient documentation

## 2014-01-23 DIAGNOSIS — I4892 Unspecified atrial flutter: Secondary | ICD-10-CM

## 2014-01-23 DIAGNOSIS — R0789 Other chest pain: Secondary | ICD-10-CM

## 2014-01-23 DIAGNOSIS — I483 Typical atrial flutter: Secondary | ICD-10-CM

## 2014-01-23 MED ORDER — AMIODARONE HCL 200 MG PO TABS: 200.0000 mg | ORAL_TABLET | Freq: Two times a day (BID) | ORAL | Status: DC

## 2014-01-23 MED ORDER — IRBESARTAN 300 MG PO TABS: ORAL_TABLET | ORAL | Status: DC

## 2014-01-23 MED ORDER — DILTIAZEM HCL ER 240 MG PO CP24: 240.0000 mg | ORAL_CAPSULE | Freq: Every day | ORAL | Status: DC

## 2014-01-23 NOTE — Assessment & Plan Note (Signed)
She is anticoagulated on Xarelto.   as part of today's evaluation I spent greater than 25 minutes with her total care. More than half of this time is been with direct contact with the patient and her daughter.

## 2014-01-23 NOTE — Assessment & Plan Note (Addendum)
In the hospital the patient had converted spontaneously to sinus rhythm. I had started her on amiodarone in the hospital. She went home on a higher dose that was then decreased. As of today she is on 200 mg daily. She has return of atrial flutter. It is not clear when she reverted to atrial flutter. I suspect that she was in sinus rhythm in the primary care office recently. She does not sense the rapid heartbeat. She may have had some vague back discomfort that maybe her symptom with her rapid heartbeat. Today I will increase her amiodarone back up to 200 mg twice daily. Also hold her ARB and start long acting diltiazem at 240 mg daily. We will have her heart rate checked in the office on Monday. I will see her in the office on Wednesday. If she has significant tachycardia on Monday, we may have to arrange for cardioversion. I think she goes in and out of atrial flutter. This is why I am not proceeding immediately with cardioversion.

## 2014-01-23 NOTE — Assessment & Plan Note (Signed)
In the hospital she had some significant back pressure and chest pressure. Currently there is only a very rare vague sensation. I suspect this is related to her atrial flutter. We will work aggressively on controlling her rate.

## 2014-01-23 NOTE — Progress Notes (Signed)
Patient ID: [name], female   DOB: July 13, 1927, 80 y.o.   MRN: [id number]    HPI  The patient is here as a post hospital visit. I met her for the first time when she was hospitalized at [facility]. She came in with chest pain and rapid atrial flutter. She did have elevated BNP. Her echo showed good LV function. It appeared that symptomatic rapid atrial flutter was the main problem. Amiodarone was started for rate control. Anticoagulation was started. We made plans for TEE cardioversion. However she spontaneously converted the morning before the planned TEE. Therefore TEE was never done. She was discharged home on amiodarone. Discharge date was January 06, 2014. Since being at home the patient has seen her primary physician. She and her daughter do not report any concerns from that visit about an increased heart rate. Today she comes in with a heart rate of 130. She does not sense any tachycardia. She thinks she may have had a small amount of the back discomfort that she felt much more significantly in the hospital.  Allergies  Allergen Reactions  . Codeine     Current Outpatient Prescriptions  Medication Sig Dispense Refill  . amiodarone (PACERONE) 200 MG tablet Take 1 tablet (200 mg total) by mouth daily.  30 tablet  0  . apixaban (ELIQUIS) 5 MG TABS tablet Take 1 tablet (5 mg total) by mouth 2 (two) times daily.  60 tablet  0  . beta carotene w/minerals (OCUVITE) tablet Take 1 tablet by mouth daily.      . Ibuprofen (ADVIL PO) Take 200 mg by mouth daily as needed. For pain      . irbesartan (AVAPRO) 300 MG tablet Take 300 mg by mouth daily.      Marland Kitchen levothyroxine (SYNTHROID, LEVOTHROID) 25 MCG tablet Take 50 mcg by mouth daily before breakfast.        No current facility-administered medications for this visit.    History   Social History  . Marital Status: Widowed    Spouse Name: N/A    Number of Children: N/A  . Years of Education: N/A   Occupational History  . Not on  file.   Social History Main Topics  . Smoking status: Former Smoker    Types: Cigarettes  . Smokeless tobacco: Not on file  . Alcohol Use: No  . Drug Use: No  . Sexual Activity: Not on file   Other Topics Concern  . Not on file   Social History Narrative  . No narrative on file    No family history on file.  Past Medical History  Diagnosis Date  . Hypertension   . Atrial fibrillation   . H/O Bell's palsy   . Cancer     breast,colon  . Ejection fraction     Past Surgical History  Procedure Laterality Date  . Breast lumpectomy      left  . Colectomy      and ventral hernia repair with mesh placement  . Other surgical history      hysterectomy  . Abdominal hysterectomy    . Hernia repair    . Cataract extraction      Patient Active Problem List   Diagnosis Date Noted  . Ejection fraction   . Elevated brain natriuretic peptide (BNP) level 01/04/2014  . Atrial flutter 01/03/2014  . Chest pressure 01/03/2014  . H/O Bell's palsy     ROS   Patient denies fever, chills, headache, sweats, rash, change  in vision, change in hearing, chest pain, cough, nausea vomiting, urinary symptoms. All other systems are reviewed and are negative.  PHYSICAL EXAM   Patient is here with her daughter. She is oriented to person time and place. Affect is normal. Head is atraumatic. Sclera and conjunctiva are normal. There is no jugulovenous distention. Lungs are clear. Respiratory effort is nonlabored. Cardiac exam reveals S1 and S2. The rhythm is regular. The rate is increased. Abdomen is soft. There is no peripheral edema. There no musculoskeletal deformities. There are no skin rashes.  Filed Vitals:   01/23/14 1057  BP: 136/98  Pulse: 134  Height: _0  (1.651 m)  Weight: 185 lb 1.9 oz (83.97 kg)  SpO2: 98%   EKG is done today and reviewed by me. The rate is 130. The rhythm is consistent with return of atrial flutter.  ASSESSMENT & PLAN

## 2014-01-23 NOTE — Patient Instructions (Addendum)
**Note De-Identified  Obfuscation** Your physician has recommended you make the following change in your medication: Do not take Irbesartan until you are advised to resume by Dr Ron Parker. Increase Amiodarone to 200 mg twice daily (please take dose tonight)  and start taking Diltiazem 240 mg daily (please take dose today).  Your physician recommends that you schedule a follow-up appointment on: Monday for a nurse visit to get EKG and next Wednesday with Dr Ron Parker.

## 2014-01-26 ENCOUNTER — Ambulatory Visit (INDEPENDENT_AMBULATORY_CARE_PROVIDER_SITE_OTHER): Payer: Medicare Other

## 2014-01-26 VITALS — BP 120/70 | HR 65 | Ht 65.0 in | Wt 185.0 lb

## 2014-01-26 DIAGNOSIS — I4892 Unspecified atrial flutter: Secondary | ICD-10-CM

## 2014-01-26 NOTE — Progress Notes (Signed)
**Note De-Identified  Obfuscation** 1.) Reason for visit: follow up EKG  2.) Name of MD requesting visit: Dr Ron Parker        3.) H&P: the pt had OV with Dr Ron Parker on Friday 01/23/14. EKG showed A-Flutter with pulse of 134 bpm. The pt was advised to not take                             Irbesartan until she is advised to resume by Dr Ron Parker, increase Amiodarone to 200 mg twice daily (she took a 1st dose Friday night) and                   Start taking Diltiazem 240 mg daily (she took 1 st dose Friday night ).  4.) ROS related to problem: the pt denies lightheadedness, syncope or palpitations.  3.) Assessment and plan per MD: Per Dr Ron Parker the pt is advised to continue to take medications as prescribed at 9/25 OV and come to [place] scheduled on 9/30. The pt and her daughter verbalized understanding.

## 2014-01-28 ENCOUNTER — Encounter: Payer: Self-pay | Admitting: Cardiology

## 2014-01-28 ENCOUNTER — Ambulatory Visit (INDEPENDENT_AMBULATORY_CARE_PROVIDER_SITE_OTHER): Payer: Medicare Other | Admitting: Cardiology

## 2014-01-28 VITALS — BP 124/62 | HR 52 | Ht 65.0 in | Wt 188.8 lb

## 2014-01-28 DIAGNOSIS — I4892 Unspecified atrial flutter: Secondary | ICD-10-CM

## 2014-01-28 DIAGNOSIS — Z7901 Long term (current) use of anticoagulants: Secondary | ICD-10-CM

## 2014-01-28 DIAGNOSIS — I483 Typical atrial flutter: Secondary | ICD-10-CM

## 2014-01-28 DIAGNOSIS — R0789 Other chest pain: Secondary | ICD-10-CM

## 2014-01-28 MED ORDER — APIXABAN 5 MG PO TABS: 5.0000 mg | ORAL_TABLET | Freq: Two times a day (BID) | ORAL | Status: DC

## 2014-01-28 MED ORDER — DILTIAZEM HCL ER 240 MG PO CP24: 240.0000 mg | ORAL_CAPSULE | Freq: Every day | ORAL | Status: DC

## 2014-01-28 MED ORDER — AMIODARONE HCL 200 MG PO TABS: 200.0000 mg | ORAL_TABLET | Freq: Two times a day (BID) | ORAL | Status: DC

## 2014-01-28 NOTE — Progress Notes (Signed)
Patient ID: [name], female   DOB: 1928-01-02, 80 y.o.   MRN: [id number]    HPI  Patient is seen to followup atrial flutter. And I saw her in the hospital January 23, 2014 she was post hospitalization. She left the hospital in sinus rhythm. However she was back in atrial flutter in the office. I increased her amiodarone back to 200 twice a day. I added diltiazem. She was brought in several days later for an EKG. This showed that her heart rate was under much better control. Today she is here for further followup. She does feel better with her rate under better control.  Allergies  Allergen Reactions  . Codeine     Current Outpatient Prescriptions  Medication Sig Dispense Refill  . amiodarone (PACERONE) 200 MG tablet Take 1 tablet (200 mg total) by mouth 2 (two) times daily.  60 tablet  3  . apixaban (ELIQUIS) 5 MG TABS tablet Take 1 tablet (5 mg total) by mouth 2 (two) times daily.  60 tablet  0  . beta carotene w/minerals (OCUVITE) tablet Take 1 tablet by mouth daily.      Marland Kitchen diltiazem (DILACOR XR) 240 MG 24 hr capsule Take 1 capsule (240 mg total) by mouth daily.  30 capsule  3  . Ibuprofen (ADVIL PO) Take 200 mg by mouth daily as needed. For pain      . irbesartan (AVAPRO) 300 MG tablet ON HOLD      . levothyroxine (SYNTHROID, LEVOTHROID) 25 MCG tablet Take 50 mcg by mouth daily before breakfast.        No current facility-administered medications for this visit.    History   Social History  . Marital Status: Widowed    Spouse Name: N/A    Number of Children: N/A  . Years of Education: N/A   Occupational History  . Not on file.   Social History Main Topics  . Smoking status: Former Smoker    Types: Cigarettes  . Smokeless tobacco: Not on file  . Alcohol Use: No  . Drug Use: No  . Sexual Activity: Not on file   Other Topics Concern  . Not on file   Social History Narrative  . No narrative on file    No family history on file.  Past Medical History    Diagnosis Date  . Hypertension   . Atrial fibrillation   . H/O Bell's palsy   . Cancer     breast,colon  . Ejection fraction     Past Surgical History  Procedure Laterality Date  . Breast lumpectomy      left  . Colectomy      and ventral hernia repair with mesh placement  . Other surgical history      hysterectomy  . Abdominal hysterectomy    . Hernia repair    . Cataract extraction      Patient Active Problem List   Diagnosis Date Noted  . Chronic anticoagulation 01/23/2014  . Ejection fraction   . Elevated brain natriuretic peptide (BNP) level 01/04/2014  . Atrial flutter 01/03/2014  . Chest pressure 01/03/2014  . H/O Bell's palsy     ROS   Patient denies fever, chills, headache, sweats, rash, change in vision, change in hearing, chest pain, cough, nausea or vomiting, urinary symptoms. All the systems are reviewed and are negative.  PHYSICAL EXAM  Patient is here with her family member. She is oriented to person time and place. Affect is normal. There is  no jugulovenous distention. Lungs are clear. Respiratory effort is nonlabored. Cardiac exam reveals S1 and S2. Abdomen is soft. Is no peripheral edema.  Filed Vitals:   01/28/14 1055  BP: 124/62  Pulse: 52  Height: 5\' 5"  (1.651 m)  Weight: 188 lb 12.8 oz (85.639 kg)  SpO2: 97%   EKG is done again today and she is still in atrial flutter. Rate is nicely controlled in the range of 75.  ASSESSMENT & PLAN

## 2014-01-28 NOTE — Assessment & Plan Note (Signed)
She has not had any recurring chest discomfort. At some point we may do a nuclear study. I've not scheduled this yet.

## 2014-01-28 NOTE — Patient Instructions (Signed)
Your physician recommends that you continue on your current medications as directed. Please refer to the Current Medication list given to you today.  Your physician recommends that you schedule a follow-up appointment in: 3 weeks

## 2014-01-28 NOTE — Assessment & Plan Note (Signed)
She will be continued on Eliquis.

## 2014-01-28 NOTE — Assessment & Plan Note (Signed)
Today she has continued atrial flutter with a controlled rate. She is anticoagulated. The plan at this point is to continue the amiodarone load and to continue diltiazem. She's to go about full activities. At some point I may try to proceed with cardioversion. I am not planning it now because she has gone in and out of it on one occasion. I want to be sure that she's fully loaded with amiodarone. She might be a candidate for flutter ablation. However she does not want more dance procedures at this time. At age 80 I'm happy to try to treat her medically at this point if we can.

## 2014-01-30 ENCOUNTER — Telehealth: Payer: Self-pay | Admitting: Cardiology

## 2014-01-30 NOTE — Telephone Encounter (Signed)
New message     Talk to a nurse regarding eliquis.  She is due for a refill.  Before they refill the medication, they want to know if she will be on this medication for at least 3 more months because it is expensive.  They think she will not be on it much longer.

## 2014-01-30 NOTE — Telephone Encounter (Signed)
Spoke with pt's daughter and told her pt will probably be on Eliquis long term. They will refill at pharmacy today.

## 2014-02-06 ENCOUNTER — Telehealth: Payer: Self-pay

## 2014-02-06 NOTE — Telephone Encounter (Signed)
On 02/02/14 we received a letter from the pts daughter, Pamala Hurry, stating that the pts BP was elevated on Saturday 01/31/14 as follows: 10 pm BP=141/94 and HR= 83    She took it several more times throughout that night and got readings of: BP=124/98 with a pulse of 83 BP=145/98 with a pulse of 78 BP=146/97 with a pulse of 83 No times specified for above readings.  At 4 am BP= 149/98 with a pulse of 81  She took her Sunday (02/01/14) am meds at 10 am and re checked her BP an hour later and her BP was 120/74 with a HR of 63 1:30 BP= 127/78 with a HR of 60 10 pm BP= 138/74 with a Hr of 69  On Monday (02/02/14) at 11 am BP= 130/70 with a HR of 66  Included in the letter were a couple of questions which were: How long should she wait before calling MD and/or how high can her BP get before we should call someone?  Answer: Call for sustained BP of 160/90 or greater. What do we recommend as a laxative for the pt? Answer: she can take OTC Docusate, Senokot or Miralax.

## 2014-02-08 ENCOUNTER — Encounter (HOSPITAL_COMMUNITY): Payer: Self-pay | Admitting: Emergency Medicine

## 2014-02-08 ENCOUNTER — Emergency Department (HOSPITAL_COMMUNITY)
Admission: EM | Admit: 2014-02-08 | Discharge: 2014-02-08 | Disposition: A | Discharge status: Home / Self Care | Payer: Medicare Other | Attending: Emergency Medicine | Admitting: Emergency Medicine

## 2014-02-08 DIAGNOSIS — Z79899 Other long term (current) drug therapy: Secondary | ICD-10-CM | POA: Insufficient documentation

## 2014-02-08 DIAGNOSIS — Z8669 Personal history of other diseases of the nervous system and sense organs: Secondary | ICD-10-CM | POA: Insufficient documentation

## 2014-02-08 DIAGNOSIS — I10 Essential (primary) hypertension: Secondary | ICD-10-CM | POA: Insufficient documentation

## 2014-02-08 DIAGNOSIS — I483 Typical atrial flutter: Secondary | ICD-10-CM | POA: Diagnosis not present

## 2014-02-08 DIAGNOSIS — Z853 Personal history of malignant neoplasm of breast: Secondary | ICD-10-CM | POA: Insufficient documentation

## 2014-02-08 DIAGNOSIS — Z87891 Personal history of nicotine dependence: Secondary | ICD-10-CM | POA: Diagnosis not present

## 2014-02-08 DIAGNOSIS — Z85038 Personal history of other malignant neoplasm of large intestine: Secondary | ICD-10-CM | POA: Diagnosis not present

## 2014-02-08 DIAGNOSIS — R079 Chest pain, unspecified: Secondary | ICD-10-CM | POA: Diagnosis present

## 2014-02-08 LAB — BASIC METABOLIC PANEL WITH GFR
Anion gap: 14 (ref 5–15)
BUN: 29 mg/dL — ABNORMAL HIGH (ref 6–23)
CO2: 21 meq/L (ref 19–32)
Calcium: 9.6 mg/dL (ref 8.4–10.5)
Chloride: 101 meq/L (ref 96–112)
Creatinine, Ser: 1.16 mg/dL — ABNORMAL HIGH (ref 0.50–1.10)
GFR calc Af Amer: 49 mL/min — ABNORMAL LOW
GFR calc non Af Amer: 42 mL/min — ABNORMAL LOW
Glucose, Bld: 130 mg/dL — ABNORMAL HIGH (ref 70–99)
Potassium: 4.6 meq/L (ref 3.7–5.3)
Sodium: 136 meq/L — ABNORMAL LOW (ref 137–147)

## 2014-02-08 LAB — I-STAT TROPONIN, ED: Troponin i, poc: 0.02 ng/mL (ref 0.00–0.08)

## 2014-02-08 LAB — CBC
HCT: 44 % (ref 36.0–46.0)
Hemoglobin: 14.9 g/dL (ref 12.0–15.0)
MCH: 30.5 pg (ref 26.0–34.0)
MCHC: 33.9 g/dL (ref 30.0–36.0)
MCV: 90.2 fL (ref 78.0–100.0)
Platelets: 161 10*3/uL (ref 150–400)
RBC: 4.88 MIL/uL (ref 3.87–5.11)
RDW: 13.4 % (ref 11.5–15.5)
WBC: 8.2 10*3/uL (ref 4.0–10.5)

## 2014-02-08 NOTE — Discharge Instructions (Signed)
Atrial Flutter °Atrial flutter is a heart rhythm that can cause the heart to beat very fast (tachycardia). It originates in the upper chambers of the heart (atria). In atrial flutter, the top chambers of the heart (atria) often beat much faster than the bottom chambers of the heart (ventricles). Atrial flutter has a regular "saw toothed" appearance in an EKG readout. An EKG is a test that records the electrical activity of the heart. Atrial flutter can cause the heart to beat up to 150 beats per minute (BPM). Atrial flutter can either be short lived (paroxysmal) or permanent.  °CAUSES  °Causes of atrial flutter can be many. Some of these include: °· Heart related issues: °¨ Heart attack (myocardial infarction). °¨ Heart failure. °¨ Heart valve problems. °¨ Poorly controlled high blood pressure (hypertension). °¨ After open heart surgery. °· Lung related issues: °¨ A blood clot in the lungs (pulmonary embolism). °¨ Chronic obstructive pulmonary disease (COPD). Medications used to treat COPD can attribute to atrial flutter. °· Other related causes: °¨ Hyperthyroidism. °¨ Caffeine. °¨ Some decongestant cold medications. °¨ Low electrolyte levels such as potassium or magnesium. °¨ Cocaine. °SYMPTOMS °· An awareness of your heart beating rapidly (palpitations). °· Shortness of breath. °· Chest pain. °· Low blood pressure (hypotension). °· Dizziness or fainting. °DIAGNOSIS  °Different tests can be performed to diagnose atrial flutter.  °· An EKG. °· Holter monitor. This is a 24-hour recording of your heart rhythm. You will also be given a diary. Write down all symptoms that you have and what you were doing at the time you experienced symptoms. °· Cardiac event monitor. This small device can be worn for up to 30 days. When you have heart symptoms, you will push a button on the device. This will then record your heart rhythm. °· Echocardiogram. This is an imaging test to look at your heart. Your caregiver will look at your  heart valves and the ventricles. °· Stress test. This test can help determine if the atrial flutter is related to exercise or if coronary artery disease is present. °· Laboratory studies will look at certain blood levels like: °¨ Complete blood count (CBC). °¨ Potassium. °¨ Magnesium. °¨ Thyroid function. °TREATMENT  °Treatment of atrial flutter varies. A combination of therapies may be used or sometimes atrial flutter may need only 1 type of treatment.  °Lab work: °If your blood work, such as your electrolytes (potassium, magnesium) or your thyroid function tests, are abnormal, your caregiver will treat them accordingly.  °Medication:  °There are several different types of medications that can convert your heart to a normal rhythm and prevent atrial flutter from reoccurring.  °Nonsurgical procedures: °Nonsurgical techniques may be used to control atrial flutter. Some examples include: °· Cardioversion. This technique uses either drugs or an electrical shock to restore a normal heart rhythm: °¨ Cardioversion drugs may be given through an intravenous (IV) line to help "reset" the heart rhythm. °¨ In electrical cardioversion, your caregiver shocks your heart with electrical energy. This helps to reset the heartbeat to a normal rhythm. °· Ablation. If atrial flutter is a persistent problem, an ablation may be needed. This procedure is done under mild sedation. High frequency radio-wave energy is used to destroy the area of heart tissue responsible for atrial flutter. °SEEK IMMEDIATE MEDICAL CARE IF:  °You have: °· Dizziness. °· Near fainting or fainting. °· Shortness of breath. °· Chest pain or pressure. °· Sudden nausea or vomiting. °· Profuse sweating. °If you have the above symptoms,   call your local emergency service immediately! Do not drive yourself to the hospital. °MAKE SURE YOU:  °· Understand these instructions. °· Will watch your condition. °· Will get help right away if you are not doing well or get  worse. °Document Released: 09/03/2008 Document Revised: 09/01/2013 Document Reviewed: 09/03/2008 °[employer]® Patient Information ©2015 [employer]. This information is not intended to replace advice given to you by your health care provider. Make sure you discuss any questions you have with your health care provider. ° °

## 2014-02-08 NOTE — ED Notes (Signed)
PT states that she was recently admitted for Aflutter and has been seeing her cardiologist; pt states that the MD is working on her medications; pt states that she has had tightness to her neck and chest area today; and c/o anxiety; pt states that her BP at home has been elevated in the afternoon and during the night and she is concerned about that and it cause her anxiety; pt states "I just want someone to tell what to do for the BP going up at night and the afternoon"

## 2014-02-08 NOTE — ED Provider Notes (Signed)
CSN: [id number]     Arrival date & time 02/08/14  1950 History   First MD Initiated Contact with Patient 02/08/14 2056     Chief Complaint  Patient presents with  . Anxiety  . Chest Pain     (Consider location/radiation/quality/duration/timing/severity/associated sxs/prior Treatment) HPI Comments: The patient is an 80 year old female with history of hypertension and atrial flutter who was recently seen and treated by the cardiologist with rate control, amiodarone and blood thinners. She states that her blood pressure has gradually been going up specifically at night time but does come down to a more normal range in the morning after she takes her medications. She denies any other complaints specifically she denies chest pain, shortness of breath or swelling of the legs. The patient states that her blood pressure being high is very concerning and anxiety provoking, she is taking her blood pressure multiple times a day, sometimes as often as once an hour because she is worried about how high it is. She is measured several blood pressures with a diastolic over 573 but most measurements range between 220 and 254 systolic over 27-06 diastolic.  Patient is a 80 y.o. female presenting with anxiety and chest pain. The history is provided by the patient.  Anxiety Associated symptoms include chest pain.  Chest Pain Associated symptoms: anxiety     Past Medical History  Diagnosis Date  . Hypertension   . Atrial fibrillation   . H/O Bell's palsy   . Cancer     breast,colon  . Ejection fraction    Past Surgical History  Procedure Laterality Date  . Breast lumpectomy      left  . Colectomy      and ventral hernia repair with mesh placement  . Other surgical history      hysterectomy  . Abdominal hysterectomy    . Hernia repair    . Cataract extraction     No family history on file. History  Substance Use Topics  . Smoking status: Former Smoker    Types: Cigarettes  . Smokeless  tobacco: Not on file  . Alcohol Use: No   OB History   Grav Para Term Preterm Abortions TAB SAB Ect Mult Living                 Review of Systems  Cardiovascular: Positive for chest pain.  All other systems reviewed and are negative.     Allergies  Codeine  Home Medications   Prior to Admission medications   Medication Sig Start Date End Date Taking? Authorizing Provider  amiodarone (PACERONE) 200 MG tablet Take 1 tablet (200 mg total) by mouth 2 (two) times daily. 01/28/14  Yes Carlena Bjornstad, MD  apixaban (ELIQUIS) 5 MG TABS tablet Take 1 tablet (5 mg total) by mouth 2 (two) times daily. 01/28/14  Yes Carlena Bjornstad, MD  diltiazem (DILACOR XR) 240 MG 24 hr capsule Take 1 capsule (240 mg total) by mouth daily. 01/28/14  Yes Carlena Bjornstad, MD  Ibuprofen (ADVIL PO) Take 200 mg by mouth daily as needed. For pain   Yes Historical Provider, MD  levothyroxine (SYNTHROID, LEVOTHROID) 25 MCG tablet Take 50 mcg by mouth daily before breakfast.    Yes Historical Provider, MD  beta carotene w/minerals (OCUVITE) tablet Take 1 tablet by mouth daily.    Historical Provider, MD  irbesartan (AVAPRO) 300 MG tablet ON HOLD 01/23/14   Carlena Bjornstad, MD   BP 139/64  Pulse 80  Temp(Src)  98 F (36.7 C)  Resp 20  SpO2 98% Physical Exam  Nursing note and vitals reviewed. Constitutional: She appears well-developed and well-nourished. No distress.  HENT:  Head: Normocephalic and atraumatic.  Mouth/Throat: Oropharynx is clear and moist. No oropharyngeal exudate.  Eyes: Conjunctivae and EOM are normal. Pupils are equal, round, and reactive to light. Right eye exhibits no discharge. Left eye exhibits no discharge. No scleral icterus.  Neck: Normal range of motion. Neck supple. No JVD present. No thyromegaly present.  Cardiovascular: Normal rate, normal heart sounds and intact distal pulses.  Exam reveals no gallop and no friction rub.   No murmur heard. Slight irregular rhythm, normal pulses, no JVD   Pulmonary/Chest: Effort normal and breath sounds normal. No respiratory distress. She has no wheezes. She has no rales.  Abdominal: Soft. Bowel sounds are normal. She exhibits no distension and no mass. There is no tenderness.  Musculoskeletal: Normal range of motion. She exhibits no edema and no tenderness.  Lymphadenopathy:    She has no cervical adenopathy.  Neurological: She is alert. Coordination normal.  Skin: Skin is warm and dry. No rash noted. No erythema.  Psychiatric: She has a normal mood and affect. Her behavior is normal.    ED Course  Procedures (including critical care time) Labs Review Labs Reviewed  [place], ED    Imaging Review No results found.   EKG Interpretation   Date/Time:  Sunday February 08 2014 20:31:21 EDT Ventricular Rate:  81 PR Interval:    QRS Duration: 88 QT Interval:  422 QTC Calculation: 490 R Axis:   12 Text Interpretation:  Atrial flutter Ventricular premature complex  Nonspecific repol abnormality, diffuse leads Since last tracing Atrial  flutter now present Confirmed by Lema Heinkel  MD, [place] ([id number]) on 02/08/2014  9:27:41 PM      MDM   Final diagnoses:  Typical atrial flutter    The patient is well appearing, her vital signs are totally normal at this time with a blood pressure of 139/64, given her age at 51 this is very appropriate. I discussed with the patient and her family members the need to take her blood pressure at the same time every day in relation to taking her medications and to followup with her family doctors as needed if she sees a trend of blood pressure movement upwards. Overall looking back at over 10 days the blood pressure data that the patient brought with her, there is no trend of hypertension that is concerning at all. She is maintained in an atrial flutter, similar to her prior cardiology visit, at this time there is no indication for any further invasive treatment, she is  already anticoagulated and rate control. The patient and family members were in agreement with the discussion and the plan for followup.    Johnna Acosta, MD 02/08/14 2128

## 2014-02-23 ENCOUNTER — Ambulatory Visit (INDEPENDENT_AMBULATORY_CARE_PROVIDER_SITE_OTHER): Payer: Medicare Other | Admitting: Cardiology

## 2014-02-23 ENCOUNTER — Encounter: Payer: Self-pay | Admitting: Cardiology

## 2014-02-23 VITALS — BP 132/60 | HR 78 | Ht 65.0 in | Wt 192.0 lb

## 2014-02-23 DIAGNOSIS — R0789 Other chest pain: Secondary | ICD-10-CM

## 2014-02-23 DIAGNOSIS — I483 Typical atrial flutter: Secondary | ICD-10-CM

## 2014-02-23 DIAGNOSIS — Z7901 Long term (current) use of anticoagulants: Secondary | ICD-10-CM

## 2014-02-23 NOTE — Assessment & Plan Note (Signed)
The patient has not had any recurrent chest pressure.

## 2014-02-23 NOTE — Assessment & Plan Note (Signed)
The patient has been well anticoagulated for more than a month.

## 2014-02-23 NOTE — Addendum Note (Signed)
**Note De-Identified Marcele Kosta Obfuscation** Addended by: Dennie Fetters on: 02/23/2014 06:44 PM   Modules accepted: Orders

## 2014-02-23 NOTE — Progress Notes (Signed)
Patient ID: [name], female   DOB: 1927/12/19, 80 y.o.   MRN: [id number]    HPI  The patient is seen today for follow-up atrial flutter. I have been continuing her amiodarone load at 200 mg twice daily. She is now received this for greater than one month. She brings in her recording of her pulse at home. It tends to be in the range of 75. She's feeling well. However as I examine her her rate is faster. She is not having any chest pain.  Allergies  Allergen Reactions  . Codeine     Current Outpatient Prescriptions  Medication Sig Dispense Refill  . amiodarone (PACERONE) 200 MG tablet Take 1 tablet (200 mg total) by mouth 2 (two) times daily.  180 tablet  3  . apixaban (ELIQUIS) 5 MG TABS tablet Take 1 tablet (5 mg total) by mouth 2 (two) times daily.  180 tablet  3  . beta carotene w/minerals (OCUVITE) tablet Take 1 tablet by mouth daily.      Marland Kitchen diltiazem (DILACOR XR) 240 MG 24 hr capsule Take 1 capsule (240 mg total) by mouth daily.  90 capsule  3  . Ibuprofen (ADVIL PO) Take 200 mg by mouth daily as needed. For pain      . irbesartan (AVAPRO) 300 MG tablet ON HOLD      . levothyroxine (SYNTHROID, LEVOTHROID) 25 MCG tablet Take 50 mcg by mouth daily before breakfast.        No current facility-administered medications for this visit.    History   Social History  . Marital Status: Widowed    Spouse Name: N/A    Number of Children: N/A  . Years of Education: N/A   Occupational History  . Not on file.   Social History Main Topics  . Smoking status: Former Smoker    Types: Cigarettes  . Smokeless tobacco: Not on file  . Alcohol Use: No  . Drug Use: No  . Sexual Activity: Not on file   Other Topics Concern  . Not on file   Social History Narrative  . No narrative on file    No family history on file.  Past Medical History  Diagnosis Date  . Hypertension   . Atrial fibrillation   . H/O Bell's palsy   . Cancer     breast,colon  . Ejection fraction      Past Surgical History  Procedure Laterality Date  . Breast lumpectomy      left  . Colectomy      and ventral hernia repair with mesh placement  . Other surgical history      hysterectomy  . Abdominal hysterectomy    . Hernia repair    . Cataract extraction      Patient Active Problem List   Diagnosis Date Noted  . Chronic anticoagulation 01/23/2014  . Ejection fraction   . Elevated brain natriuretic peptide (BNP) level 01/04/2014  . Atrial flutter 01/03/2014  . Chest pressure 01/03/2014  . H/O Bell's palsy     ROS   Patient denies fever, chills, headache, sweats, rash, change in vision, change in hearing, chest pain, cough, nausea or vomiting, urinary symptoms. All other systems are reviewed and are negative.  PHYSICAL EXAM  Patient is oriented to person time and place. Affect is normal. She really looks quite good. She is here with her daughter. Head is atraumatic. Sclera and conjunctiva are normal. There is no jugular venous distention. Lungs are clear. Respiratory effort  is not labored. Cardiac exam reveals an S1 and S2. The abdomen is soft. There is no peripheral edema. There are no musculoskeletal deformities. There are no skin rashes.  Filed Vitals:   02/23/14 1639  BP: 132/60  Pulse: 78  Height: 5\' 5"  (1.651 m)  Weight: 192 lb (87.091 kg)   EKG is done today and reviewed by me. While watching the EKG monitor I did see flutter waves. There is flutter seen on the 12-lead. The rate on the EKG isn't a range of 100. However there is varying block seen.  ASSESSMENT & PLAN

## 2014-02-23 NOTE — Assessment & Plan Note (Signed)
The patient has continued atrial flutter. She has had a significant load with 400 mg daily of amiodarone for at least a month. Even with this she has variations in her heart rate although she feels reasonably well. I feel it is most appropriate to proceed with cardioversion on one occasion. If she converts and holds and we can reduce her dose over time this will be the optimal approach for her at this time. If she does not convert or she does not hold I will look into other options. She may well be a good candidate for atrial flutter ablation but she may not be willing to do this.  As part of today's evaluation I spent 5 minutes with the patient. More than half of this time has been spent with direct contact with the patient and her daughter counseling them. We have discussed the approach atrial flutter at great length and the plans for cardioversion.

## 2014-02-24 ENCOUNTER — Telehealth: Payer: Self-pay | Admitting: *Deleted

## 2014-02-24 ENCOUNTER — Encounter: Payer: Self-pay | Admitting: *Deleted

## 2014-02-24 LAB — BASIC METABOLIC PANEL
BUN: 22 mg/dL (ref 6–23)
CALCIUM: 9.5 mg/dL (ref 8.4–10.5)
CHLORIDE: 103 meq/L (ref 96–112)
CO2: 20 mEq/L (ref 19–32)
CREATININE: 1.3 mg/dL — AB (ref 0.4–1.2)
GFR: 42.57 mL/min — ABNORMAL LOW (ref >60.00)
Glucose, Bld: 94 mg/dL (ref 70–99)
Potassium: 4 mEq/L (ref 3.5–5.1)
Sodium: 137 mEq/L (ref 135–145)

## 2014-02-24 NOTE — Telephone Encounter (Signed)
Notified the pt and the daughter Pamala Hurry about cardioversion being scheduled per Dr Ron Parker for this Friday 02/27/2014 at 11 am with Dr Ron Parker at the [facility] at [place] both parties to be at the [employer] at [facility] at 0900.  Cardioversion is needed per Dr Ron Parker for aflutter.  Confirmation # for cardioversion is [id number], and spoke with Delilah Shan in cardioversion scheduling.  Informed both parties that a letter of instruction will be available for pick-up on 10/28, for that's when the daughter can get here to pick this up.  Briefly went over cardioversion instructions over the phone with the daughter.  Will send Dr Ron Parker a message to place hospital orders in epic.  Pt and daughter verbalized understanding of instructions given, with read back, and agree with this plan.  Will route this message to Dr Ron Parker and nurse for further review.

## 2014-02-25 ENCOUNTER — Other Ambulatory Visit: Payer: Self-pay | Admitting: Cardiology

## 2014-02-25 ENCOUNTER — Encounter (HOSPITAL_COMMUNITY): Payer: Self-pay | Admitting: Pharmacy Technician

## 2014-02-25 DIAGNOSIS — I483 Typical atrial flutter: Secondary | ICD-10-CM

## 2014-02-25 NOTE — H&P (Signed)
History and physical for cardioversion February 27, 2014      Patient ID: [name], female   DOB: March 07, 1928, 80 y.o.   MRN: [id number]   HPI  The patient is seen today for follow-up atrial flutter. I have been continuing her amiodarone load at 200 mg twice daily. She is now received this for greater than one month. She brings in her recording of her pulse at home. It tends to be in the range of 75. She's feeling well. However as I examine her her rate is faster. She is not having any chest pain.    Allergies   Allergen  Reactions   .  Codeine           Current Outpatient Prescriptions   Medication  Sig  Dispense  Refill   .  amiodarone (PACERONE) 200 MG tablet  Take 1 tablet (200 mg total) by mouth 2 (two) times daily.   180 tablet   3   .  apixaban (ELIQUIS) 5 MG TABS tablet  Take 1 tablet (5 mg total) by mouth 2 (two) times daily.   180 tablet   3   .  beta carotene w/minerals (OCUVITE) tablet  Take 1 tablet by mouth daily.         Marland Kitchen  diltiazem (DILACOR XR) 240 MG 24 hr capsule  Take 1 capsule (240 mg total) by mouth daily.   90 capsule   3   .  Ibuprofen (ADVIL PO)  Take 200 mg by mouth daily as needed. For pain         .  irbesartan (AVAPRO) 300 MG tablet  ON HOLD         .  levothyroxine (SYNTHROID, LEVOTHROID) 25 MCG tablet  Take 50 mcg by mouth daily before breakfast.              No current facility-administered medications for this visit.         History       Social History   .  Marital Status:  Widowed       Spouse Name:  N/A       Number of Children:  N/A   .  Years of Education:  N/A       Occupational History   .  Not on file.       Social History Main Topics   .  Smoking status:  Former Smoker       Types:  Cigarettes   .  Smokeless tobacco:  Not on file   .  Alcohol Use:  No   .  Drug Use:  No   .  Sexual Activity:  Not on file       Other Topics  Concern   .  Not on file       Social History Narrative   .  No narrative on file         No family history on file.    Past Medical History   Diagnosis  Date   .  Hypertension     .  Atrial fibrillation     .  H/O Bell's palsy     .  Cancer         breast,colon   .  Ejection fraction           Past Surgical History   Procedure  Laterality  Date   .  Breast lumpectomy  left   .  Colectomy           and ventral hernia repair with mesh placement   .  Other surgical history           hysterectomy   .  Abdominal hysterectomy       .  Hernia repair       .  Cataract extraction             Patient Active Problem List     Diagnosis  Date Noted   .  Chronic anticoagulation  01/23/2014   .  Ejection fraction     .  Elevated brain natriuretic peptide (BNP) level  01/04/2014   .  Atrial flutter  01/03/2014   .  Chest pressure  01/03/2014   .  H/O Bell's palsy          ROS    Patient denies fever, chills, headache, sweats, rash, change in vision, change in hearing, chest pain, cough, nausea or vomiting, urinary symptoms. All other systems are reviewed and are negative.   PHYSICAL EXAM  Patient is oriented to person time and place. Affect is normal. She really looks quite good. She is here with her daughter. Head is atraumatic. Sclera and conjunctiva are normal. There is no jugular venous distention. Lungs are clear. Respiratory effort is not labored. Cardiac exam reveals an S1 and S2. The abdomen is soft. There is no peripheral edema. There are no musculoskeletal deformities. There are no skin rashes.    Filed Vitals:     02/23/14 1639   BP:  132/60   Pulse:  78   Height:  5\' 5"  (1.651 m)   Weight:  192 lb (87.091 kg)      EKG is done today and reviewed by me. While watching the EKG monitor I did see flutter waves. There is flutter seen on the 12-lead. The rate on the EKG isn't a range of 100. However there is varying block seen.   ASSESSMENT & PLAN            Chest pressure - Carlena Bjornstad, MD at 02/23/2014  5:29 PM      Status:  Written Related Problem: Chest pressure    The patient has not had any recurrent chest pressure.         Chronic anticoagulation - Carlena Bjornstad, MD at 02/23/2014  5:30 PM      Status: Written Related Problem: Chronic anticoagulation    The patient has been well anticoagulated for more than a month.         Atrial flutter - Carlena Bjornstad, MD at 02/23/2014  5:32 PM      Status: Written Related Problem: Atrial flutter    The patient has continued atrial flutter. She has had a significant load with 400 mg daily of amiodarone for at least a month. Even with this she has variations in her heart rate although she feels reasonably well. I feel it is most appropriate to proceed with cardioversion on one occasion. If she converts and holds and we can reduce her dose over time this will be the optimal approach for her at this time. If she does not convert or she does not hold I will look into other options. She may well be a good candidate for atrial flutter ablation but she may not be willing to do this.   As part of today's evaluation I spent 5 minutes with the patient. More than  half of this time has been spent with direct contact with the patient and her daughter counseling them. We have discussed the approach atrial flutter at great length and the plans for cardioversion.   Daryel November, MD

## 2014-02-26 MED ORDER — SODIUM CHLORIDE 0.9 % IV SOLN
INTRAVENOUS | Status: DC
  Administered 2014-02-27: 500 mL via INTRAVENOUS

## 2014-02-27 ENCOUNTER — Encounter (HOSPITAL_COMMUNITY): Payer: Medicare Other | Admitting: Anesthesiology

## 2014-02-27 ENCOUNTER — Encounter (HOSPITAL_COMMUNITY): Payer: Self-pay

## 2014-02-27 ENCOUNTER — Encounter (HOSPITAL_COMMUNITY)
Admission: RE | Disposition: A | Discharge status: Home / Self Care | Payer: 59 | Source: Ambulatory Visit | Attending: Cardiology

## 2014-02-27 ENCOUNTER — Ambulatory Visit (HOSPITAL_COMMUNITY): Payer: Medicare Other | Admitting: Anesthesiology

## 2014-02-27 ENCOUNTER — Ambulatory Visit (HOSPITAL_COMMUNITY)
Admission: RE | Admit: 2014-02-27 | Discharge: 2014-02-27 | Disposition: A | Discharge status: Home / Self Care | Payer: Medicare Other | Source: Ambulatory Visit | Attending: Cardiology | Admitting: Cardiology

## 2014-02-27 DIAGNOSIS — I4892 Unspecified atrial flutter: Secondary | ICD-10-CM

## 2014-02-27 DIAGNOSIS — I440 Atrioventricular block, first degree: Secondary | ICD-10-CM | POA: Diagnosis not present

## 2014-02-27 DIAGNOSIS — I483 Typical atrial flutter: Secondary | ICD-10-CM | POA: Diagnosis present

## 2014-02-27 DIAGNOSIS — Z7901 Long term (current) use of anticoagulants: Secondary | ICD-10-CM | POA: Insufficient documentation

## 2014-02-27 HISTORY — PX: CARDIOVERSION: SHX1299

## 2014-02-27 SURGERY — CARDIOVERSION
Anesthesia: General

## 2014-02-27 MED ORDER — FENTANYL CITRATE 0.05 MG/ML IJ SOLN: 25.0000 ug | INTRAMUSCULAR | Status: DC | PRN

## 2014-02-27 MED ORDER — PROPOFOL 10 MG/ML IV BOLUS
INTRAVENOUS | Status: DC | PRN
  Administered 2014-02-27: 40 mg via INTRAVENOUS

## 2014-02-27 MED ORDER — LIDOCAINE HCL (CARDIAC) 20 MG/ML IV SOLN
INTRAVENOUS | Status: DC | PRN
  Administered 2014-02-27: 40 mg via INTRAVENOUS

## 2014-02-27 MED ORDER — PROMETHAZINE HCL 25 MG/ML IJ SOLN: 6.2500 mg | INTRAMUSCULAR | Status: DC | PRN

## 2014-02-27 MED ORDER — MEPERIDINE HCL 100 MG/ML IJ SOLN: 6.2500 mg | INTRAMUSCULAR | Status: DC | PRN

## 2014-02-27 NOTE — H&P (Signed)
  A complete updated history and physical was done very recently. The patient is now here for cardioversion. There is no change in her physical exam. She is stable and ready for cardioversion.  Daryel November, MD

## 2014-02-27 NOTE — Transfer of Care (Signed)
Immediate Anesthesia Transfer of Care Note  Patient: [name]  Procedure(s) Performed: Procedure(s): CARDIOVERSION (N/A)  Patient Location: PACU and Endoscopy Unit  Anesthesia Type:General  Level of Consciousness: awake, alert , oriented and sedated  Airway & Oxygen Therapy: Patient Spontanous Breathing and Patient connected to nasal cannula oxygen  Post-op Assessment: Report given to PACU RN, Post -op Vital signs reviewed and stable and Patient moving all extremities  Post vital signs: Reviewed and stable  Complications: No apparent anesthesia complications

## 2014-02-27 NOTE — Discharge Instructions (Signed)

## 2014-02-27 NOTE — CV Procedure (Signed)
Time out and consent completed.  Anesthesia present.  40mg  Propofol IV, IV lidocaine  Anterior - posterior pads. Biphasic unit.  One shock with 75joules.  Converted to NSR.  Tolerated well.  Daryel November, MD

## 2014-02-27 NOTE — Anesthesia Preprocedure Evaluation (Addendum)
Anesthesia Evaluation  Patient identified by MRN, date of birth, ID band  Reviewed: Allergy & Precautions, H&P , NPO status   Airway Mallampati: II       Dental  (+) Teeth Intact, Dental Advisory Given   Pulmonary former smoker,  breath sounds clear to auscultation        Cardiovascular hypertension, Pt. on medications + dysrhythmias Atrial Fibrillation Rhythm:Irregular  ECHO 12/2013 EF 60%, mild LVH   Neuro/Psych    GI/Hepatic   Endo/Other    Renal/GU      Musculoskeletal   Abdominal (+) + obese,   Peds  Hematology   Anesthesia Other Findings   Reproductive/Obstetrics                          Anesthesia Physical Anesthesia Plan  ASA: III  Anesthesia Plan: General   Post-op Pain Management:    Induction: Intravenous  Airway Management Planned: Nasal Cannula  Additional Equipment:   Intra-op Plan:   Post-operative Plan:   Informed Consent: I have reviewed the patients History and Physical, chart, labs and discussed the procedure including the risks, benefits and alternatives for the proposed anesthesia with the patient or authorized representative who has indicated his/her understanding and acceptance.     Plan Discussed with:   Anesthesia Plan Comments:         Anesthesia Quick Evaluation

## 2014-02-27 NOTE — Anesthesia Postprocedure Evaluation (Signed)
  Anesthesia Post-op Note  Patient: [name]  Procedure(s) Performed: Procedure(s): CARDIOVERSION (N/A)  Patient Location: PACU  Anesthesia Type:MAC  Level of Consciousness: awake and alert   Airway and Oxygen Therapy: Patient Spontanous Breathing  Post-op Pain: none  Post-op Assessment: Post-op Vital signs reviewed and Patient's Cardiovascular Status Stable  Post-op Vital Signs: Reviewed and stable  Last Vitals:  Filed Vitals:   02/27/14 1127  BP: 135/68  Pulse: 67  Temp: 36.7 C  Resp: 13    Complications: No apparent anesthesia complications

## 2014-03-02 ENCOUNTER — Encounter (HOSPITAL_COMMUNITY): Payer: Self-pay | Admitting: Cardiology

## 2014-03-03 NOTE — Addendum Note (Signed)
Addendum  created 03/03/14 1412 by Alexis Frock, MD   Modules edited: Anesthesia Responsible Staff

## 2014-03-05 NOTE — Addendum Note (Signed)
Addendum  created 03/05/14 1159 by Alexis Frock, MD   Modules edited: Anesthesia Responsible Staff

## 2014-03-09 ENCOUNTER — Ambulatory Visit (INDEPENDENT_AMBULATORY_CARE_PROVIDER_SITE_OTHER): Payer: Medicare Other | Admitting: Cardiology

## 2014-03-09 ENCOUNTER — Encounter: Payer: Self-pay | Admitting: Cardiology

## 2014-03-09 VITALS — BP 132/80 | HR 89 | Ht 65.0 in | Wt 192.8 lb

## 2014-03-09 DIAGNOSIS — Z7901 Long term (current) use of anticoagulants: Secondary | ICD-10-CM

## 2014-03-09 DIAGNOSIS — R0789 Other chest pain: Secondary | ICD-10-CM

## 2014-03-09 DIAGNOSIS — E039 Hypothyroidism, unspecified: Secondary | ICD-10-CM | POA: Insufficient documentation

## 2014-03-09 DIAGNOSIS — E038 Other specified hypothyroidism: Secondary | ICD-10-CM

## 2014-03-09 DIAGNOSIS — I483 Typical atrial flutter: Secondary | ICD-10-CM

## 2014-03-09 LAB — TSH: TSH: 2.17 u[IU]/mL (ref 0.35–4.50)

## 2014-03-09 NOTE — Patient Instructions (Signed)
**Note De-Identified  Obfuscation** Your physician has recommended you make the following change in your medication: stop taking Amiodarone  Your physician recommends that you return for lab work in: today [facility])  Your physician recommends that you schedule a follow-up appointment in: 2 to 3 weeks

## 2014-03-09 NOTE — Assessment & Plan Note (Signed)
The patient has returned to atrial flutter since her recent cardioversion. This has occurred despite the use of amiodarone that has been at 400 mg a day for greater than one month. Therefore it appears and amiodarone will not work. She does not have significant symptoms at this time. Amiodarone will be stopped. She will remain on diltiazem. We will arrange early follow-up to be sure that she does not have a significant increase in heart rate when off amiodarone.

## 2014-03-09 NOTE — Progress Notes (Signed)
Patient ID: [name], female   DOB: 1928-03-25, 80 y.o.   MRN: [id number]    HPI  patient is seen back today to follow-up atrial flutter. I saw her last in the office on October 26. We decided to proceed with outpatient cardioversion. This was done on October 30. She converted with one shock of 75 J. She now returns today for a follow-up visit. She is feeling fine. She has reverted back to atrial flutter. She cannot tell when this occurred. She is not having any significant symptoms. This has occurred with her being on high dose amiodarone at 400 mg daily.  Allergies  Allergen Reactions  . Codeine Nausea And Vomiting  . Sulfa Antibiotics Nausea Only    Current Outpatient Prescriptions  Medication Sig Dispense Refill  . amiodarone (PACERONE) 200 MG tablet Take 1 tablet (200 mg total) by mouth 2 (two) times daily. 180 tablet 3  . apixaban (ELIQUIS) 5 MG TABS tablet Take 1 tablet (5 mg total) by mouth 2 (two) times daily. 180 tablet 3  . beta carotene w/minerals (OCUVITE) tablet Take 1 tablet by mouth daily.    Marland Kitchen diltiazem (DILACOR XR) 240 MG 24 hr capsule Take 1 capsule (240 mg total) by mouth daily. 90 capsule 3  . Ibuprofen (ADVIL PO) Take 200 mg by mouth daily as needed (pain). For pain    . levothyroxine (SYNTHROID, LEVOTHROID) 25 MCG tablet Take 50 mcg by mouth daily before breakfast.     . irbesartan (AVAPRO) 300 MG tablet      No current facility-administered medications for this visit.    History   Social History  . Marital Status: Widowed    Spouse Name: N/A    Number of Children: N/A  . Years of Education: N/A   Occupational History  . Not on file.   Social History Main Topics  . Smoking status: Former Smoker    Types: Cigarettes  . Smokeless tobacco: Not on file  . Alcohol Use: No  . Drug Use: No  . Sexual Activity: Not on file   Other Topics Concern  . Not on file   Social History Narrative    History reviewed. No pertinent family history.  Past  Medical History  Diagnosis Date  . Hypertension   . Atrial fibrillation   . H/O Bell's palsy   . Cancer     breast,colon  . Ejection fraction     Past Surgical History  Procedure Laterality Date  . Breast lumpectomy      left  . Colectomy      and ventral hernia repair with mesh placement  . Other surgical history      hysterectomy  . Abdominal hysterectomy    . Hernia repair    . Cataract extraction    . Cardioversion N/A 02/27/2014    Procedure: CARDIOVERSION;  Surgeon: Carlena Bjornstad, MD;  Location: [facility] ENDOSCOPY;  Service: Cardiovascular;  Laterality: N/A;    Patient Active Problem List   Diagnosis Date Noted  . Chronic anticoagulation 01/23/2014  . Ejection fraction   . Elevated brain natriuretic peptide (BNP) level 01/04/2014  . Atrial flutter 01/03/2014  . Chest pressure 01/03/2014  . H/O Bell's palsy     ROS   patient denies fever, chills, headache, sweats, rash, change in vision, change in hearing, chest pain, cough, nausea or vomiting, urinary symptoms. All other systems are reviewed and are negative.  PHYSICAL EXAM  patient is oriented to person time and place. Affect  is normal. She's here with her daughter. Head is atraumatic. Sclera and conjunctiva are normal. There is no jugular venous distention. Lungs are clear. Respiratory effort is nonlabored. Cardiac exam reveals an S1 and S2. Abdomen is soft. There is no peripheral edema.  Filed Vitals:   03/09/14 1452  BP: 132/80  Pulse: 89  Height: 5\' 5"  (1.651 m)  Weight: 192 lb 12.8 oz (87.454 kg)   EKG is done today and reviewed by. She has reverted back to atrial flutter. The rate is 85.  ASSESSMENT & PLAN

## 2014-03-09 NOTE — Assessment & Plan Note (Signed)
She will remain on anticoagulation long-term for her atrial flutter.

## 2014-03-09 NOTE — Assessment & Plan Note (Signed)
TSH will be checked today to see what her function is relative to her treatment with amiodarone.

## 2014-03-09 NOTE — Assessment & Plan Note (Signed)
She has not had any recurrent chest pressure. No further workup at this time.

## 2014-03-11 ENCOUNTER — Telehealth: Payer: Self-pay | Admitting: Cardiology

## 2014-03-11 NOTE — Telephone Encounter (Signed)
Spoke with patient's daughter re Medications. At 01/23/2014 visit, Dr. Ron Parker had her hold the Avapro and start Diltiazem 240mg  daily. She was here 03/09/2014 for a visit, and no mention was made of restarting Avapro. Amiodarone was discontinued also. Continue meds as stated. Daughter voices good understanding.

## 2014-03-11 NOTE — Telephone Encounter (Signed)
FOLLOW UP,    Per daughter's call today she needs a call back in regards to pt's med's.   Pt needs to confirm  what she is taking.  IRBSARTAN??

## 2014-03-18 ENCOUNTER — Ambulatory Visit: Payer: Medicare Other | Admitting: Cardiology

## 2014-03-27 ENCOUNTER — Encounter: Payer: Self-pay | Admitting: Cardiology

## 2014-03-27 ENCOUNTER — Ambulatory Visit (INDEPENDENT_AMBULATORY_CARE_PROVIDER_SITE_OTHER): Payer: Medicare Other | Admitting: Cardiology

## 2014-03-27 VITALS — BP 120/80 | HR 117 | Ht 65.0 in | Wt 193.4 lb

## 2014-03-27 DIAGNOSIS — Z7901 Long term (current) use of anticoagulants: Secondary | ICD-10-CM

## 2014-03-27 DIAGNOSIS — I483 Typical atrial flutter: Secondary | ICD-10-CM

## 2014-03-27 MED ORDER — DIGOXIN 125 MCG PO TABS: ORAL_TABLET | ORAL | Status: DC

## 2014-03-27 MED ORDER — METOPROLOL SUCCINATE ER 50 MG PO TB24: 50.0000 mg | ORAL_TABLET | Freq: Every day | ORAL | Status: DC

## 2014-03-27 NOTE — Assessment & Plan Note (Signed)
She continues on her anticoagulation.

## 2014-03-27 NOTE — Patient Instructions (Addendum)
START METOPROLOL SUCCINATE 50 MG 1 TABLET DAILY  START DIGOXIN 0.125 MG DAILY; EXCEPT TODAY TAKE 2 TABS WHEN YOU GET HOME THEN 2 TABS BEFORE BEDTIME TONIGHT; THEN 03/28/14 TOMORROW AM 2 TABS THEN 03/28/14 2 TABS AT 2 PM;  THEN BEGINNING Sunday 03/29/14 YOU WILL BEGIN DIGOXIN 0.125 MG DAILY (1 TAB DAILY)  YOU HAVE A FOLLOW UP WITH DR. KATZ 4:15 03/30/14 PER DR. KATZ

## 2014-03-27 NOTE — Progress Notes (Signed)
Patient ID: [name], female   DOB: Jun 22, 1927, 80 y.o.   MRN: [id number]    HPI Patient is seen today to follow-up atrial flutter. She has had one outpatient cardioversion. She converted rapidly. However she did not hold sinus rhythm despite having been loaded completely with amiodarone 400 mg daily. Therefore I decided to stop her amiodarone. Diltiazem was continued for rate control. She is now here for follow-up. She returns today feeling well. However her atrial flutter rate is 115 bpm at this time. I am not comfortable with her having persistent heart rate in this range. She is not having chest pain or shortness of breath.  Allergies  Allergen Reactions  . Codeine Nausea And Vomiting  . Sulfa Antibiotics Nausea Only    Current Outpatient Prescriptions  Medication Sig Dispense Refill  . amiodarone (PACERONE) 200 MG tablet Take 1 tablet (200 mg total) by mouth 2 (two) times daily. 180 tablet 3  . apixaban (ELIQUIS) 5 MG TABS tablet Take 1 tablet (5 mg total) by mouth 2 (two) times daily. 180 tablet 3  . beta carotene w/minerals (OCUVITE) tablet Take 1 tablet by mouth daily.    Marland Kitchen diltiazem (DILACOR XR) 240 MG 24 hr capsule Take 1 capsule (240 mg total) by mouth daily. 90 capsule 3  . Ibuprofen (ADVIL PO) Take 200 mg by mouth daily as needed (pain). For pain    . irbesartan (AVAPRO) 300 MG tablet     . levothyroxine (SYNTHROID, LEVOTHROID) 25 MCG tablet Take 50 mcg by mouth daily before breakfast.     . levothyroxine (SYNTHROID, LEVOTHROID) 50 MCG tablet Take 50 mcg by mouth daily.  5   No current facility-administered medications for this visit.    History   Social History  . Marital Status: Widowed    Spouse Name: N/A    Number of Children: N/A  . Years of Education: N/A   Occupational History  . Not on file.   Social History Main Topics  . Smoking status: Former Smoker    Types: Cigarettes  . Smokeless tobacco: Not on file  . Alcohol Use: No  . Drug Use: No  .  Sexual Activity: Not on file   Other Topics Concern  . Not on file   Social History Narrative    No family history on file.  Past Medical History  Diagnosis Date  . Hypertension   . Atrial fibrillation   . H/O Bell's palsy   . Cancer     breast,colon  . Ejection fraction     Past Surgical History  Procedure Laterality Date  . Breast lumpectomy      left  . Colectomy      and ventral hernia repair with mesh placement  . Other surgical history      hysterectomy  . Abdominal hysterectomy    . Hernia repair    . Cataract extraction    . Cardioversion N/A 02/27/2014    Procedure: CARDIOVERSION;  Surgeon: Carlena Bjornstad, MD;  Location: [facility] ENDOSCOPY;  Service: Cardiovascular;  Laterality: N/A;    Patient Active Problem List   Diagnosis Date Noted  . Hypothyroidism 03/09/2014  . Chronic anticoagulation 01/23/2014  . Ejection fraction   . Elevated brain natriuretic peptide (BNP) level 01/04/2014  . Atrial flutter 01/03/2014  . Chest pressure 01/03/2014  . H/O Bell's palsy     ROS  Patient denies fever, chills, headache, sweats, rash, change in vision, change in hearing, chest pain, cough, nausea or vomiting,  urinary symptoms. All other systems are reviewed and are negative.  PHYSICAL EXAM Patient is here with her daughter today. She is oriented to person time and place. Affect is normal. Head is atraumatic. Sclera and conjunctiva are normal. Lungs are clear. Respiratory effort is not labored. There is no jugular venous distention. Cardiac exam reveals an S1 and S2. The rhythm is regular but fast. Abdomen is soft. There is no peripheral edema. There are no musculoskeletal deformities. There are no skin rashes.  Filed Vitals:   03/27/14 1559  Height: 5\' 5"  (1.651 m)  Weight: 193 lb 6.4 oz (87.726 kg)   EKG is done today and reviewed by me. She has atrial flutter. The rate is 117.  ASSESSMENT & PLAN

## 2014-03-27 NOTE — Assessment & Plan Note (Signed)
The patient's atrial flutter rate has increased since her amiodarone was stopped. I'm has an intact to have her ambulating on a regular basis with a continued heart rate of 117. I will add a beta blocker. I do feel that digoxin would be indicated in this situation. I will then see her back for early follow-up. If I cannot control her heart rate well, other possibilities will be to resume amiodarone. For heart rate control or consider atrial flutter ablation. She is an active 80 year old female. I am not convinced that she would not be a candidate for atrial flutter ablation in this setting with uncontrolled rate.

## 2014-03-30 ENCOUNTER — Other Ambulatory Visit: Payer: Self-pay

## 2014-03-30 ENCOUNTER — Ambulatory Visit (INDEPENDENT_AMBULATORY_CARE_PROVIDER_SITE_OTHER): Payer: Medicare Other | Admitting: Cardiology

## 2014-03-30 ENCOUNTER — Encounter: Payer: Self-pay | Admitting: Cardiology

## 2014-03-30 VITALS — BP 130/80 | HR 56 | Ht 65.0 in | Wt 192.0 lb

## 2014-03-30 DIAGNOSIS — I483 Typical atrial flutter: Secondary | ICD-10-CM

## 2014-03-30 DIAGNOSIS — R600 Localized edema: Secondary | ICD-10-CM

## 2014-03-30 MED ORDER — DIGOXIN 0.0625 MG HALF TABLET: ORAL_TABLET | ORAL | Status: DC

## 2014-03-30 MED ORDER — DIGOXIN 0.0625 MG HALF TABLET: 0.0625 mg | ORAL_TABLET | Freq: Every day | ORAL | Status: DC

## 2014-03-30 MED ORDER — DIGOXIN 62.5 MCG PO TABS
62.5000 ug | ORAL_TABLET | Freq: Every day | ORAL | Status: DC
Start: 2014-03-30 — End: 2014-04-08

## 2014-03-30 NOTE — Patient Instructions (Addendum)
Your physician has recommended you make the following change in your medication: decrease Digoxin to 0.0625 mg daily  Your physician recommends that you schedule a follow-up appointment in: 1 week

## 2014-03-30 NOTE — Assessment & Plan Note (Signed)
I suspect that her edema is from vascular insufficiency. I am definitely has an intact to give her diuretic today while her heart rate is on the slow side. We will reassess this further as time goes on.

## 2014-03-30 NOTE — Assessment & Plan Note (Signed)
With the addition of a beta blocker and digoxin she now has 4-1 flutter with an underlying rate of 56. I do not want her this slow. In addition, I want to be sure that there is no chance that she could develop a digoxin level greater then the current recommendations. Digoxin will be decreased to 0.0625 mg daily. She'll be kept on her beta blocker. I'll see her so in for follow-up.

## 2014-03-30 NOTE — Progress Notes (Signed)
Patient ID: [name], female   DOB: 1927/05/04, 80 y.o.   MRN: [id number]    HPI Patient returns today to follow-up atrial flutter. When I saw her several days ago I was concerned because a resting rate at a range of 120-130. She was already on diltiazem. I added metoprolol and loaded her with digoxin with a continuing dose of 0.125 mg daily. She returns today feeling well. She mentions the edema that she has in her legs. However she states that it is gone completely after resting at night time. She says that she may have had very brief shortness of breath on one occasion. Her EKG here today shows 4-1 AV block with atrial flutter with an underlying rate of 56.  Allergies  Allergen Reactions  . Codeine Nausea And Vomiting  . Sulfa Antibiotics Nausea Only    Current Outpatient Prescriptions  Medication Sig Dispense Refill  . apixaban (ELIQUIS) 5 MG TABS tablet Take 1 tablet (5 mg total) by mouth 2 (two) times daily. 180 tablet 3  . beta carotene w/minerals (OCUVITE) tablet Take 1 tablet by mouth daily.    . digoxin (LANOXIN) 0.125 MG tablet 1 tab daily except see instructions sheet given by Dr. Ron Parker today 45 tablet 11  . diltiazem (DILACOR XR) 240 MG 24 hr capsule Take 1 capsule (240 mg total) by mouth daily. 90 capsule 3  . Ibuprofen (ADVIL PO) Take 200 mg by mouth daily as needed (pain). For pain    . irbesartan (AVAPRO) 300 MG tablet     . levothyroxine (SYNTHROID, LEVOTHROID) 50 MCG tablet Take 50 mcg by mouth daily.  5  . metoprolol succinate (TOPROL-XL) 50 MG 24 hr tablet Take 1 tablet (50 mg total) by mouth daily. Take with or immediately following a meal. 30 tablet 11   No current facility-administered medications for this visit.    History   Social History  . Marital Status: Widowed    Spouse Name: N/A    Number of Children: N/A  . Years of Education: N/A   Occupational History  . Not on file.   Social History Main Topics  . Smoking status: Former Smoker   Types: Cigarettes  . Smokeless tobacco: Not on file  . Alcohol Use: No  . Drug Use: No  . Sexual Activity: Not on file   Other Topics Concern  . Not on file   Social History Narrative    No family history on file.  Past Medical History  Diagnosis Date  . Hypertension   . Atrial fibrillation   . H/O Bell's palsy   . Cancer     breast,colon  . Ejection fraction     Past Surgical History  Procedure Laterality Date  . Breast lumpectomy      left  . Colectomy      and ventral hernia repair with mesh placement  . Other surgical history      hysterectomy  . Abdominal hysterectomy    . Hernia repair    . Cataract extraction    . Cardioversion N/A 02/27/2014    Procedure: CARDIOVERSION;  Surgeon: Carlena Bjornstad, MD;  Location: [facility] ENDOSCOPY;  Service: Cardiovascular;  Laterality: N/A;    Patient Active Problem List   Diagnosis Date Noted  . Edema leg 03/30/2014  . Hypothyroidism 03/09/2014  . Chronic anticoagulation 01/23/2014  . Ejection fraction   . Elevated brain natriuretic peptide (BNP) level 01/04/2014  . Atrial flutter 01/03/2014  . Chest pressure 01/03/2014  .  H/O Bell's palsy     ROS   Patient denies fever, chills, headache, sweats, rash, change in vision, change in hearing, chest pain, cough, nausea or vomiting, urinary symptoms. All other systems are reviewed and are negative.  PHYSICAL EXAM Patient is oriented to person time and place. Affect is normal. She is here with her daughter. Head is atraumatic. Sclera and conjunctiva are normal. There is no jugulovenous distention. Lungs are clear. Respiratory effort is not labored. Cardiac exam reveals S1 and S2. Abdomen is soft. She does have 1+ peripheral edema bilaterally.  Filed Vitals:   03/30/14 1623  BP: 130/80  Pulse: 56  Height: 5\' 5"  (1.651 m)  Weight: 192 lb (87.091 kg)   EKG is done today and reviewed by me. She has atrial flutter with 4:1  conduction. The rate is 56.  ASSESSMENT & PLAN

## 2014-04-08 ENCOUNTER — Ambulatory Visit (INDEPENDENT_AMBULATORY_CARE_PROVIDER_SITE_OTHER): Payer: Medicare Other | Admitting: Cardiology

## 2014-04-08 ENCOUNTER — Encounter: Payer: Self-pay | Admitting: Cardiology

## 2014-04-08 VITALS — BP 98/56 | HR 56 | Ht 65.0 in | Wt 198.8 lb

## 2014-04-08 DIAGNOSIS — R600 Localized edema: Secondary | ICD-10-CM

## 2014-04-08 DIAGNOSIS — R21 Rash and other nonspecific skin eruption: Secondary | ICD-10-CM

## 2014-04-08 DIAGNOSIS — I483 Typical atrial flutter: Secondary | ICD-10-CM

## 2014-04-08 MED ORDER — FUROSEMIDE 20 MG PO TABS: ORAL_TABLET | ORAL | Status: DC

## 2014-04-08 MED ORDER — METOPROLOL SUCCINATE ER 25 MG PO TB24: 25.0000 mg | ORAL_TABLET | Freq: Every day | ORAL | Status: DC

## 2014-04-08 NOTE — Assessment & Plan Note (Signed)
She has vascular insufficiency. However she's also gain true body weight. She will be cutting down her excess fluid intake. I'm adding furosemide 20 mg daily. She will take this until she loses the 5 pounds. Cutting digoxin out and lowering metoprolol to 25 mg daily. It appears that she needs a higher heart rate. I'll see her back next week.

## 2014-04-08 NOTE — Patient Instructions (Signed)
Your physician has recommended you make the following change in your medication: Stop taking Digoxin, decrease Metoprolol to 25 mg daily and start taking Lasix 20 mg when your weight is 195 lbs. or greater. For now take Lasix 20 mg daily until your weight is back down to 192 lbs.  Your physician recommends that you weigh, daily, at the same time every day, and in the same amount of clothing. Please record your daily weights on the handout provided and bring it to your next appointment.  Your physician recommends that you schedule a follow-up appointment in: next week  Dr Ron Parker wants you to decrease your fluid intake

## 2014-04-08 NOTE — Assessment & Plan Note (Signed)
This rash is related to her edema. It will resolve when her edema is treated.

## 2014-04-08 NOTE — Progress Notes (Signed)
Patient ID: [name], female   DOB: 06-Oct-1927, 80 y.o.   MRN: [id number]    HPI Patient is seen back today to follow-up atrial flutter. She is not held on amiodarone. This was stopped. When I saw her back she had an increased heart rate of 120-130. She was on diltiazem at that time. I added metoprolol and digoxin.  I saw her back on March 30, 2014 her atrial flutter was 4-1 block. There was question of mild edema. I cut her digoxin dose down to 0.0625 mg daily. She returns today with a 5 pound weight gain. Her heart rate is still at 4-1 conduction with a rate of 56. She is having exertional shortness of breath. She has been watching her salt intake. However she's drinking a full glass of water with every pill that she takes. She clearly has excess free water intake at this time.  Allergies  Allergen Reactions  . Codeine Nausea And Vomiting  . Sulfa Antibiotics Nausea Only    Current Outpatient Prescriptions  Medication Sig Dispense Refill  . apixaban (ELIQUIS) 5 MG TABS tablet Take 1 tablet (5 mg total) by mouth 2 (two) times daily. 180 tablet 3  . beta carotene w/minerals (OCUVITE) tablet Take 1 tablet by mouth daily.    . Digoxin 62.5 MCG TABS Take by mouth.    . diltiazem (DILACOR XR) 240 MG 24 hr capsule Take 1 capsule (240 mg total) by mouth daily. 90 capsule 3  . Ibuprofen (ADVIL PO) Take 200 mg by mouth daily as needed (pain). For pain    . levothyroxine (SYNTHROID, LEVOTHROID) 50 MCG tablet Take 50 mcg by mouth daily.  5  . metoprolol succinate (TOPROL-XL) 50 MG 24 hr tablet Take 1 tablet (50 mg total) by mouth daily. Take with or immediately following a meal. 30 tablet 11   No current facility-administered medications for this visit.    History   Social History  . Marital Status: Widowed    Spouse Name: N/A    Number of Children: N/A  . Years of Education: N/A   Occupational History  . Not on file.   Social History Main Topics  . Smoking status: Former Smoker      Types: Cigarettes  . Smokeless tobacco: Not on file  . Alcohol Use: No  . Drug Use: No  . Sexual Activity: Not on file   Other Topics Concern  . Not on file   Social History Narrative    History reviewed. No pertinent family history.  Past Medical History  Diagnosis Date  . Hypertension   . Atrial fibrillation   . H/O Bell's palsy   . Cancer     breast,colon  . Ejection fraction     Past Surgical History  Procedure Laterality Date  . Breast lumpectomy      left  . Colectomy      and ventral hernia repair with mesh placement  . Other surgical history      hysterectomy  . Abdominal hysterectomy    . Hernia repair    . Cataract extraction    . Cardioversion N/A 02/27/2014    Procedure: CARDIOVERSION;  Surgeon: Carlena Bjornstad, MD;  Location: [facility] ENDOSCOPY;  Service: Cardiovascular;  Laterality: N/A;    Patient Active Problem List   Diagnosis Date Noted  . Edema leg 03/30/2014  . Hypothyroidism 03/09/2014  . Chronic anticoagulation 01/23/2014  . Ejection fraction   . Elevated brain natriuretic peptide (BNP) level 01/04/2014  .  Atrial flutter 01/03/2014  . Chest pressure 01/03/2014  . H/O Bell's palsy     ROS  Patient denies fever, chills, headache, sweats, rash, change in vision, change in hearing, chest pain, cough, nausea or vomiting, urinary symptoms. All other systems are reviewed and are negative.  PHYSICAL EXAM Patient is oriented to person time and place. Affect is normal. She is here with her daughter. Head is atraumatic. Sclera and conjunctiva are normal. There is no jugular venous distention. Lungs are clear. Respiratory effort is nonlabored. Cardiac exam reveals S1 and S2. Abdomen is soft. There is 1-2+ bilateral edema with some reddening of the skin.  Filed Vitals:   04/08/14 0843  BP: 98/56  Pulse: 56  Height: 5\' 5"  (1.651 m)  Weight: 198 lb 12.8 oz (90.175 kg)  SpO2: 93%   EKG is done today and reviewed by me. She has atrial flutter with a  4-1 block. Overall rate is 56.  ASSESSMENT & PLAN

## 2014-04-08 NOTE — Assessment & Plan Note (Signed)
It appears that her atrial flutter rate is too slow for her. Digoxin will be stopped. Metoprolol will be cut to 25 mg daily.

## 2014-04-15 ENCOUNTER — Encounter: Payer: Self-pay | Admitting: Cardiology

## 2014-04-15 ENCOUNTER — Ambulatory Visit (INDEPENDENT_AMBULATORY_CARE_PROVIDER_SITE_OTHER): Payer: Medicare Other | Admitting: Cardiology

## 2014-04-15 VITALS — BP 128/64 | HR 56 | Ht 65.0 in | Wt 193.0 lb

## 2014-04-15 DIAGNOSIS — R0789 Other chest pain: Secondary | ICD-10-CM

## 2014-04-15 DIAGNOSIS — R600 Localized edema: Secondary | ICD-10-CM

## 2014-04-15 DIAGNOSIS — I483 Typical atrial flutter: Secondary | ICD-10-CM

## 2014-04-15 MED ORDER — METOPROLOL SUCCINATE ER 25 MG PO TB24: 12.5000 mg | ORAL_TABLET | Freq: Every day | ORAL | Status: DC

## 2014-04-15 MED ORDER — FUROSEMIDE 20 MG PO TABS: ORAL_TABLET | ORAL | Status: DC

## 2014-04-15 NOTE — Progress Notes (Signed)
Patient ID: [name], female   DOB: 04/24/1928, 80 y.o.   MRN: [id number]    HPI Patient is seen today to follow-up atrial flutter and CHF. I saw her very recently and I've been trying to adjust her medicines. Her atrial flutter rate was fast on diltiazem only. I added digoxin and metoprolol and she had significant slowing of her heart rate. She also had some edema at that time. I stopped the digoxin and cut down the metoprolol dose. Also added a diuretic. She is diuresis. She's feeling well. She still has 4-1 conduction with atrial flutter.  Allergies  Allergen Reactions  . Codeine Nausea And Vomiting  . Sulfa Antibiotics Nausea Only    Current Outpatient Prescriptions  Medication Sig Dispense Refill  . apixaban (ELIQUIS) 5 MG TABS tablet Take 1 tablet (5 mg total) by mouth 2 (two) times daily. 180 tablet 3  . beta carotene w/minerals (OCUVITE) tablet Take 1 tablet by mouth daily.    Marland Kitchen diltiazem (DILACOR XR) 240 MG 24 hr capsule Take 1 capsule (240 mg total) by mouth daily. 90 capsule 3  . furosemide (LASIX) 20 MG tablet Take 1 tablet daily for weight of 195 lbs or greater 30 tablet 3  . Ibuprofen (ADVIL PO) Take 200 mg by mouth daily as needed (pain). For pain    . levothyroxine (SYNTHROID, LEVOTHROID) 50 MCG tablet Take 50 mcg by mouth daily.  5  . metoprolol succinate (TOPROL-XL) 25 MG 24 hr tablet Take 1 tablet (25 mg total) by mouth daily. Take with or immediately following a meal. 30 tablet 3   No current facility-administered medications for this visit.    History   Social History  . Marital Status: Widowed    Spouse Name: N/A    Number of Children: N/A  . Years of Education: N/A   Occupational History  . Not on file.   Social History Main Topics  . Smoking status: Former Smoker    Types: Cigarettes  . Smokeless tobacco: Not on file  . Alcohol Use: No  . Drug Use: No  . Sexual Activity: Not on file   Other Topics Concern  . Not on file   Social History  Narrative    History reviewed. No pertinent family history.  Past Medical History  Diagnosis Date  . Hypertension   . Atrial fibrillation   . H/O Bell's palsy   . Cancer     breast,colon  . Ejection fraction     Past Surgical History  Procedure Laterality Date  . Breast lumpectomy      left  . Colectomy      and ventral hernia repair with mesh placement  . Other surgical history      hysterectomy  . Abdominal hysterectomy    . Hernia repair    . Cataract extraction    . Cardioversion N/A 02/27/2014    Procedure: CARDIOVERSION;  Surgeon: Carlena Bjornstad, MD;  Location: [facility] ENDOSCOPY;  Service: Cardiovascular;  Laterality: N/A;    Patient Active Problem List   Diagnosis Date Noted  . Skin rash 04/08/2014  . Edema leg 03/30/2014  . Hypothyroidism 03/09/2014  . Chronic anticoagulation 01/23/2014  . Ejection fraction   . Elevated brain natriuretic peptide (BNP) level 01/04/2014  . Atrial flutter 01/03/2014  . Chest pressure 01/03/2014  . H/O Bell's palsy     ROS  Patient denies fever, chills, headache, sweats, rash, change in vision, change in hearing, chest pain, cough, nausea or vomiting,  urinary symptoms. All other systems are reviewed and are negative.  PHYSICAL EXAM Patient is oriented to person time and place. Affect is normal. She is here with her daughter. Head is atraumatic. Sclera and conjunctiva are normal. There is no jugular venous distention. Lungs are clear. Respiratory effort is nonlabored. Cardiac exam reveals S1 and S2. Abdomen is soft. She still has 1+ peripheral edema. It is improved since the prior visit area the skin reaction from her edema is improved also.  Filed Vitals:   04/15/14 1553  BP: 128/64  Pulse: 56  Height: 5\' 5"  (1.651 m)  Weight: 193 lb (87.544 kg)   EKG is done today and reviewed by me. She has persistent flutter. She has 4-1 conduction with a rate of 56.  ASSESSMENT & PLAN

## 2014-04-15 NOTE — Patient Instructions (Addendum)
You have a follow up appt scheduled with Dr. Ron Parker on May 08, 2014 at 4:00  Your physician has recommended you make the following change in your medication:   Take furosemide 20 mg by mouth if weight greater than 189 lbs.  If less than 189 lbs do not take furosemide Decrease Toprol to 12.5 mg by mouth daily.

## 2014-04-15 NOTE — Assessment & Plan Note (Signed)
Her leg edema is improved. She has vascular insufficiency on the venous side also. I will change her baseline weight for diuretic dosing. If her weight is greater and 189 pounds, she will take her diuretic. Otherwise, if her weight is 189 pounds or less, she will not take the diuretic. I will see her for early follow-up.

## 2014-04-15 NOTE — Assessment & Plan Note (Signed)
Flutter is stable at 4:1. I will lower her metoprolol further.

## 2014-04-18 ENCOUNTER — Emergency Department (HOSPITAL_COMMUNITY): Payer: Medicare Other

## 2014-04-18 ENCOUNTER — Encounter (HOSPITAL_COMMUNITY): Payer: Self-pay | Admitting: Emergency Medicine

## 2014-04-18 ENCOUNTER — Emergency Department (HOSPITAL_COMMUNITY)
Admission: EM | Admit: 2014-04-18 | Discharge: 2014-04-18 | Disposition: A | Discharge status: Home / Self Care | Payer: Medicare Other | Attending: Emergency Medicine | Admitting: Emergency Medicine

## 2014-04-18 DIAGNOSIS — Z853 Personal history of malignant neoplasm of breast: Secondary | ICD-10-CM | POA: Insufficient documentation

## 2014-04-18 DIAGNOSIS — Y9289 Other specified places as the place of occurrence of the external cause: Secondary | ICD-10-CM | POA: Diagnosis not present

## 2014-04-18 DIAGNOSIS — S0031XA Abrasion of nose, initial encounter: Secondary | ICD-10-CM | POA: Insufficient documentation

## 2014-04-18 DIAGNOSIS — Y998 Other external cause status: Secondary | ICD-10-CM | POA: Insufficient documentation

## 2014-04-18 DIAGNOSIS — I4891 Unspecified atrial fibrillation: Secondary | ICD-10-CM | POA: Insufficient documentation

## 2014-04-18 DIAGNOSIS — M25561 Pain in right knee: Secondary | ICD-10-CM | POA: Diagnosis not present

## 2014-04-18 DIAGNOSIS — I4892 Unspecified atrial flutter: Secondary | ICD-10-CM | POA: Insufficient documentation

## 2014-04-18 DIAGNOSIS — Z85038 Personal history of other malignant neoplasm of large intestine: Secondary | ICD-10-CM | POA: Insufficient documentation

## 2014-04-18 DIAGNOSIS — Z7902 Long term (current) use of antithrombotics/antiplatelets: Secondary | ICD-10-CM | POA: Insufficient documentation

## 2014-04-18 DIAGNOSIS — S42202A Unspecified fracture of upper end of left humerus, initial encounter for closed fracture: Secondary | ICD-10-CM | POA: Diagnosis not present

## 2014-04-18 DIAGNOSIS — Z79899 Other long term (current) drug therapy: Secondary | ICD-10-CM | POA: Insufficient documentation

## 2014-04-18 DIAGNOSIS — Y9389 Activity, other specified: Secondary | ICD-10-CM | POA: Insufficient documentation

## 2014-04-18 DIAGNOSIS — Z87891 Personal history of nicotine dependence: Secondary | ICD-10-CM | POA: Insufficient documentation

## 2014-04-18 DIAGNOSIS — W010XXA Fall on same level from slipping, tripping and stumbling without subsequent striking against object, initial encounter: Secondary | ICD-10-CM | POA: Diagnosis not present

## 2014-04-18 DIAGNOSIS — I10 Essential (primary) hypertension: Secondary | ICD-10-CM | POA: Insufficient documentation

## 2014-04-18 DIAGNOSIS — W19XXXA Unspecified fall, initial encounter: Secondary | ICD-10-CM

## 2014-04-18 DIAGNOSIS — Z8669 Personal history of other diseases of the nervous system and sense organs: Secondary | ICD-10-CM | POA: Diagnosis not present

## 2014-04-18 DIAGNOSIS — S4992XA Unspecified injury of left shoulder and upper arm, initial encounter: Secondary | ICD-10-CM | POA: Diagnosis present

## 2014-04-18 MED ORDER — DOXYCYCLINE HYCLATE 100 MG PO CAPS: 100.0000 mg | ORAL_CAPSULE | Freq: Two times a day (BID) | ORAL | Status: DC

## 2014-04-18 MED ORDER — ONDANSETRON HCL 4 MG/2ML IJ SOLN
4.0000 mg | Freq: Once | INTRAMUSCULAR | Status: AC
  Administered 2014-04-18: 4 mg via INTRAVENOUS
  Filled 2014-04-18: qty 2

## 2014-04-18 MED ORDER — MORPHINE SULFATE 4 MG/ML IJ SOLN
4.0000 mg | Freq: Once | INTRAMUSCULAR | Status: AC
  Administered 2014-04-18: 4 mg via INTRAVENOUS
  Filled 2014-04-18: qty 1

## 2014-04-18 MED ORDER — OXYCODONE-ACETAMINOPHEN 5-325 MG PO TABS: 0.5000 | ORAL_TABLET | Freq: Four times a day (QID) | ORAL | Status: DC | PRN

## 2014-04-18 NOTE — ED Provider Notes (Signed)
CSN: [id number]     Arrival date & time 04/18/14  1407 History   First MD Initiated Contact with Patient 04/18/14 1505     Chief Complaint  Patient presents with  . Shoulder Injury     (Consider location/radiation/quality/duration/timing/severity/associated sxs/prior Treatment) HPI Comments: Patients with history of atrial flutter on Eliquis -- presents with complaint of left shoulder pain after a fall. Patient states that she was in a hurry and not using a walker. She does have some balance difficulties at baseline. She tripped on a cat litter box. She fell onto her left shoulder and left hip. She sustained a small abrasion to the bridge of her nose. She did not get knocked out or lose consciousness. She denies headache, neck pain, blurry vision, nausea or vomiting. EMS was called. They were unable to start an IV and give pain medications. They placed the patient's left arm in a sling. Patient denies hip pain, chest pain, abdominal pain, shortness of breath. She does not have any numbness or tingling in her extremities. Movement of her left arm causes the pain to be worse. Keeping it still improves the pain. Onset was acute. Course is constant.  Patient is a 80 y.o. female presenting with shoulder injury. The history is provided by the patient, medical records and a relative.  Shoulder Injury Associated symptoms include arthralgias and myalgias. Pertinent negatives include no abdominal pain, chest pain, coughing, fatigue, fever, headaches, joint swelling, nausea, neck pain, numbness, rash, sore throat, vomiting or weakness.    Past Medical History  Diagnosis Date  . Hypertension   . Atrial fibrillation   . H/O Bell's palsy   . Cancer     breast,colon  . Ejection fraction    Past Surgical History  Procedure Laterality Date  . Breast lumpectomy      left  . Colectomy      and ventral hernia repair with mesh placement  . Other surgical history      hysterectomy  . Abdominal  hysterectomy    . Hernia repair    . Cataract extraction    . Cardioversion N/A 02/27/2014    Procedure: CARDIOVERSION;  Surgeon: Carlena Bjornstad, MD;  Location: [facility] ENDOSCOPY;  Service: Cardiovascular;  Laterality: N/A;   No family history on file. History  Substance Use Topics  . Smoking status: Former Smoker    Types: Cigarettes  . Smokeless tobacco: Not on file  . Alcohol Use: No   OB History    No data available     Review of Systems  Constitutional: Negative for fever, activity change and fatigue.  HENT: Negative for rhinorrhea, sore throat and tinnitus.   Eyes: Negative for photophobia, pain, redness and visual disturbance.  Respiratory: Negative for cough and shortness of breath.   Cardiovascular: Negative for chest pain.  Gastrointestinal: Negative for nausea, vomiting, abdominal pain and diarrhea.  Genitourinary: Negative for dysuria.  Musculoskeletal: Positive for myalgias and arthralgias. Negative for back pain, joint swelling, gait problem and neck pain.  Skin: Negative for rash and wound.  Neurological: Negative for dizziness, weakness, light-headedness, numbness and headaches.  Psychiatric/Behavioral: Negative for confusion and decreased concentration.    Allergies  Codeine and Sulfa antibiotics  Home Medications   Prior to Admission medications   Medication Sig Start Date End Date Taking? Authorizing Provider  apixaban (ELIQUIS) 5 MG TABS tablet Take 1 tablet (5 mg total) by mouth 2 (two) times daily. 01/28/14  Yes Carlena Bjornstad, MD  beta carotene w/minerals (OCUVITE)  tablet Take 1 tablet by mouth daily as needed (eyes).    Yes Historical Provider, MD  diltiazem (DILACOR XR) 240 MG 24 hr capsule Take 1 capsule (240 mg total) by mouth daily. 01/28/14  Yes Carlena Bjornstad, MD  furosemide (LASIX) 20 MG tablet Take 1 tablet daily for weight of 189 lbs or greater 04/15/14  Yes Carlena Bjornstad, MD  levothyroxine (SYNTHROID, LEVOTHROID) 50 MCG tablet Take 50 mcg by  mouth daily. 03/16/14  Yes Historical Provider, MD  metoprolol succinate (TOPROL-XL) 25 MG 24 hr tablet Take 0.5 tablets (12.5 mg total) by mouth daily. Take with or immediately following a meal. 04/15/14  Yes Carlena Bjornstad, MD  Ibuprofen (ADVIL PO) Take 200 mg by mouth daily as needed (pain). For pain    Historical Provider, MD   BP 145/77 mmHg  Pulse 74  Temp(Src) 97.5 F (36.4 C) (Oral)  Resp 18  SpO2 98%   Physical Exam  Constitutional: She is oriented to person, place, and time. She appears well-developed and well-nourished.  HENT:  Head: Normocephalic. Head is without raccoon's eyes and without Battle's sign.  Right Ear: Tympanic membrane, external ear and ear canal normal. No hemotympanum.  Left Ear: Tympanic membrane, external ear and ear canal normal. No hemotympanum.  Nose: Nose normal. No nasal septal hematoma.  Mouth/Throat: Uvula is midline, oropharynx is clear and moist and mucous membranes are normal.  Mild abrasion noted over bridge of nose.  Eyes: Conjunctivae, EOM and lids are normal. Pupils are equal, round, and reactive to light. Right eye exhibits no nystagmus. Left eye exhibits no nystagmus.  No visible hyphema noted  Neck: Normal range of motion. Neck supple.  Cardiovascular: Normal rate and regular rhythm.   Pulmonary/Chest: Effort normal and breath sounds normal. No respiratory distress.  Abdominal: Soft. There is no tenderness.  Musculoskeletal:       Right shoulder: Normal.       Left shoulder: She exhibits decreased range of motion, tenderness, bony tenderness and swelling.       Right elbow: Normal.      Left elbow: Normal.       Right wrist: Normal.       Left wrist: Normal.       Right hip: Normal.       Left hip: Normal.       Right knee: She exhibits swelling. She exhibits normal range of motion. Tenderness (mild) found. No medial joint line and no lateral joint line tenderness noted.       Left knee: Normal.       Right ankle: Normal.        Left ankle: Normal.       Cervical back: Normal. She exhibits normal range of motion, no tenderness and no bony tenderness.       Thoracic back: Normal. She exhibits no tenderness and no bony tenderness.       Lumbar back: Normal. She exhibits no tenderness and no bony tenderness.       Right hand: Normal.       Left hand: Normal.       Right foot: Normal.       Left foot: Normal.  Neurological: She is alert and oriented to person, place, and time. She has normal strength and normal reflexes. No cranial nerve deficit or sensory deficit. Coordination normal. GCS eye subscore is 4. GCS verbal subscore is 5. GCS motor subscore is 6.  Skin: Skin is warm and dry.  Psychiatric:  She has a normal mood and affect.  Nursing note and vitals reviewed.   ED Course  Procedures (including critical care time) Labs Review Labs Reviewed - No data to display  Imaging Review Ct Head Wo Contrast  04/18/2014   CLINICAL DATA:  Fall, on blood thinner Eliquis  EXAM: CT HEAD WITHOUT CONTRAST  TECHNIQUE: Contiguous axial images were obtained from the base of the skull through the vertex without intravenous contrast.  COMPARISON:  MRI brain dated 10/01/2013  FINDINGS: No evidence of parenchymal hemorrhage or extra-axial fluid collection. No mass lesion, mass effect, or midline shift.  No CT evidence of acute infarction.  Subcortical white matter and periventricular small vessel ischemic changes. Mild intracranial atherosclerosis.  Global cortical atrophy.  No ventriculomegaly.  The visualized paranasal sinuses are essentially clear. The mastoid air cells are unopacified.  No evidence of calvarial fracture.  IMPRESSION: No evidence of acute intracranial abnormality.  Mild small vessel ischemic changes.   Electronically Signed   By: Julian Hy M.D.   On: 04/18/2014 16:48   Dg Shoulder Left  04/18/2014   CLINICAL DATA:  Patient status post fall.  Initial encounter.  EXAM: LEFT SHOULDER - 2+ VIEW  COMPARISON:   None.  FINDINGS: There is a displaced comminuted fracture through the proximal left humerus. The humeral head appears to be located on multiple images. Left axillary surgical clips. Visualized left hemi thorax grossly unremarkable.  IMPRESSION: Comminuted displaced proximal left humerus fracture.   Electronically Signed   By: Lovey Newcomer M.D.   On: 04/18/2014 15:54     EKG Interpretation None      3:34 PM Patient seen and examined. Work-up initiated. Medications ordered. L upper extremity with 2+ radial pulse and intact distal motor and sensation.   Vital signs reviewed and are as follows: BP 145/77 mmHg  Pulse 74  Temp(Src) 97.5 F (36.4 C) (Oral)  Resp 18  SpO2 98%  6:10 PM Patient seen previously by Dr. Eulis Foster. CT head negative.   Upper extremity has remained neurovascularly intact during ED stay. Patient's pain has been controlled. D/c to home with percocet. She is placed in shoulder immobilizer.    MDM   Final diagnoses:  Fall  Proximal humerus fracture, left, closed, initial encounter   Patient with L proximal humerus fracture. Fall was mechanical. No distal neurovascular deficit found. Ortho referral given. CT head neg.     Carlisle Cater, PA-C 04/18/14 [place], MD 04/18/14 [phone]

## 2014-04-18 NOTE — ED Notes (Signed)
Patient does not hear well in right ear. Left ear is better hearing

## 2014-04-18 NOTE — ED Notes (Signed)
Bed: KG81 Expected date: 04/18/14 Expected time: 2:02 PM Means of arrival: Ambulance Comments: ? Dislocated shoulder

## 2014-04-18 NOTE — Discharge Instructions (Signed)
Please read and follow all provided instructions.  Your diagnoses today include:  1. Proximal humerus fracture, left, closed, initial encounter   2. Fall     Tests performed today include:  An x-ray of the affected area - shows proximal humerus fracture  CT head - no problems or bleeding  Vital signs. See below for your results today.   Medications prescribed:   Percocet (oxycodone/acetaminophen) - narcotic pain medication  DO NOT drive or perform any activities that require you to be awake and alert because this medicine can make you drowsy. BE VERY CAREFUL not to take multiple medicines containing Tylenol (also called acetaminophen). Doing so can lead to an overdose which can damage your liver and cause liver failure and possibly death.  Use pain medication only under direct supervision at the lowest possible dose needed to control your pain.   Take any prescribed medications only as directed.  Home care instructions:   Follow any educational materials contained in this packet  Follow R.I.C.E. Protocol:  R - rest your injury   I  - use ice on injury without applying directly to skin  C - compress injury with bandage or splint  E - elevate the injury as much as possible  Follow-up instructions: Please follow-up with your primary care provider or the provided orthopedic physician (bone specialist) if you continue to have significant pain or trouble walking in 1 week. In this case you may have a severe injury that requires further care.   Return instructions:   Please return if your fingers are numb or tingling, appear gray or blue, or you have severe pain (also elevate leg and loosen splint or wrap if you were given one)  Please return to the Emergency Department if you experience worsening symptoms.   Please return if you have any other emergent concerns.  Additional Information:  Your vital signs today were: BP 145/77 mmHg   Pulse 74   Temp(Src) 97.5 F (36.4  C) (Oral)   Resp 18   SpO2 98% If your blood pressure (BP) was elevated above 135/85 this visit, please have this repeated by your doctor within one month. --------------

## 2014-04-18 NOTE — ED Provider Notes (Signed)
  Face-to-face evaluation   History: Mechanical fall, injuring her left shoulder primarily.  She also bumped her nose and left hip, in the fall.  She has been ambulatory since the incident.  Physical exam: Alert, calm, cooperative.  She denies headache or neck pain.  Left shoulder tender without deformity.  Nontender left elbow, and hand/wrist.  Normal sensation left hand, and deltoid region.  Medical screening examination/treatment/procedure(s) were conducted as a shared visit with non-physician practitioner(s) and myself.  I personally evaluated the patient during the encounter  Richarda Blade, MD 04/18/14 2307

## 2014-04-18 NOTE — ED Notes (Signed)
Per EMS-pt from home with c/o of fall pain to left shoulder. Blood thinner Eliquis. Denies LOC.

## 2014-04-20 ENCOUNTER — Telehealth: Payer: Self-pay | Admitting: Cardiology

## 2014-04-20 NOTE — Telephone Encounter (Signed)
**Note De-Identified  Obfuscation** Pamala Hurry, the pts daughter, states that the pt fell over a cat litter box on 12/19 (Saturday) and was taken to the ER. The pt was diagnosed with a L proximal humerus fracture and she had a small abrasion to the bridge of her nose.  She was discharged home on Percocet/Roxicet 5/325 mg to be taken every 6 hours as needed for severe pain.  Pamala Hurry wants Dr Ron Parker to be aware. Will forward note to Dr Ron Parker as Juluis Rainier.

## 2014-04-20 NOTE — Telephone Encounter (Signed)
LMTCB

## 2014-04-20 NOTE — Telephone Encounter (Signed)
New problem   Pt fell this weekend is taking oxycodone per daughter calling, didn't know dosage. Want to know if its ok to take.

## 2014-04-23 ENCOUNTER — Telehealth: Payer: Self-pay | Admitting: Cardiology

## 2014-04-23 NOTE — Telephone Encounter (Signed)
Left detailed message for patients dtr, patient can only take tylenol products because she is taking eliquis. They will call back if further questions.

## 2014-04-23 NOTE — Telephone Encounter (Signed)
New Message       Patients daughter returning call, please call back Thanks

## 2014-04-23 NOTE — Telephone Encounter (Signed)
New message      Pt fell last weekend and broke her arm.  She is on oxycodeoine.  Can she have advil or aleve in place of the oxycodeoine if she feels like she wants it?

## 2014-05-04 ENCOUNTER — Telehealth: Payer: Self-pay | Admitting: Cardiology

## 2014-05-04 MED ORDER — FUROSEMIDE 20 MG PO TABS: ORAL_TABLET | ORAL | Status: DC

## 2014-05-04 NOTE — Telephone Encounter (Signed)
[name] is advised and she verbalized understanding. RX sent to [facility] to fill per her request.

## 2014-05-04 NOTE — Telephone Encounter (Signed)
Increase Lasix to 40 twice a day

## 2014-05-04 NOTE — Telephone Encounter (Addendum)
[name], the pts daughter, states that the pt has gained 11 pounds since her last OV with Dr Ron Parker on 04/15/14 and that she is SOB with exertion and that her legs and knees are very swollen with redness and blisters/sores below her right knee. She states that the pt has been taking Furosemide 20 mg daily as directed for weight greater than 189 lbs.  She states that the pts compression stockings (ordered by orthopedics) have been ordered. She wants to know what to do, please advise.  FYI: The pt fell on 04/18/14 and was seen in the ER for a broken upper left arm.

## 2014-05-04 NOTE — Telephone Encounter (Signed)
Follow Up     Pt's daughter returning phone call. Please call at work number listed.

## 2014-05-04 NOTE — Telephone Encounter (Signed)
**Note De-identified  Obfuscation** LMTCB

## 2014-05-04 NOTE — Telephone Encounter (Signed)
New Message      Patients daughter call to speak with someone regarding her mom and the weight that she has gained since the last appt 04/15/14. Patient is taking lasix..(20mg  per day). She has swelling and blisters on the legs .

## 2014-05-08 ENCOUNTER — Encounter: Payer: Self-pay | Admitting: Cardiology

## 2014-05-08 ENCOUNTER — Ambulatory Visit (INDEPENDENT_AMBULATORY_CARE_PROVIDER_SITE_OTHER): Payer: Medicare Other | Admitting: Cardiology

## 2014-05-08 VITALS — BP 112/58 | HR 89 | Ht 65.0 in | Wt 200.2 lb

## 2014-05-08 DIAGNOSIS — I483 Typical atrial flutter: Secondary | ICD-10-CM

## 2014-05-08 DIAGNOSIS — R21 Rash and other nonspecific skin eruption: Secondary | ICD-10-CM

## 2014-05-08 DIAGNOSIS — R600 Localized edema: Secondary | ICD-10-CM

## 2014-05-08 DIAGNOSIS — Z7901 Long term (current) use of anticoagulants: Secondary | ICD-10-CM

## 2014-05-08 NOTE — Patient Instructions (Signed)
Your physician has recommended you make the following change in your medication: take 80 mg of Furosemide twice daily until your appt on Monday  Your physician recommends that you schedule a follow-up appointment in: Monday 05/11/13

## 2014-05-08 NOTE — Progress Notes (Signed)
Patient ID: [name], female   DOB: 27-Mar-1928, 81 y.o.   MRN: [id number]    HPI Patient is seen to follow-up atrial flutter. She's also developed significant volume overload. She fell and broke her left shoulder. She is in a sling. She has not been able to lie down in bed with this. Her feet have been dependent. She is developed significant edema and secondary skin changes from this. She is not having significant shortness of breath.  Allergies  Allergen Reactions  . Codeine Nausea And Vomiting  . Sulfa Antibiotics Nausea Only    Current Outpatient Prescriptions  Medication Sig Dispense Refill  . apixaban (ELIQUIS) 5 MG TABS tablet Take 1 tablet (5 mg total) by mouth 2 (two) times daily. 180 tablet 3  . beta carotene w/minerals (OCUVITE) tablet Take 1 tablet by mouth daily as needed (eyes).     Marland Kitchen diltiazem (DILACOR XR) 240 MG 24 hr capsule Take 1 capsule (240 mg total) by mouth daily. 90 capsule 3  . furosemide (LASIX) 20 MG tablet Take 2 tablets (40 mg) in the early morning and 2 tablets in the late afternoon X 5 days then decrease to 1 tablet (20 mg) daily for weight of 189 lbs or greater 40 tablet 0  . levothyroxine (SYNTHROID, LEVOTHROID) 50 MCG tablet Take 50 mcg by mouth daily.  5  . metoprolol succinate (TOPROL-XL) 25 MG 24 hr tablet Take 0.5 tablets (12.5 mg total) by mouth daily. Take with or immediately following a meal. 15 tablet 6  . OVER THE COUNTER MEDICATION Tylenol--Not sure os dosage but stated uses PRN for pain    . oxyCODONE-acetaminophen (PERCOCET/ROXICET) 5-325 MG per tablet Take 0.5-1 tablets by mouth every 6 (six) hours as needed for severe pain. 20 tablet 0   No current facility-administered medications for this visit.    History   Social History  . Marital Status: Widowed    Spouse Name: N/A    Number of Children: N/A  . Years of Education: N/A   Occupational History  . Not on file.   Social History Main Topics  . Smoking status: Former Smoker      Types: Cigarettes  . Smokeless tobacco: Not on file  . Alcohol Use: No  . Drug Use: No  . Sexual Activity: Not on file   Other Topics Concern  . Not on file   Social History Narrative    History reviewed. No pertinent family history.  Past Medical History  Diagnosis Date  . Hypertension   . Atrial fibrillation   . H/O Bell's palsy   . Cancer     breast,colon  . Ejection fraction     Past Surgical History  Procedure Laterality Date  . Breast lumpectomy      left  . Colectomy      and ventral hernia repair with mesh placement  . Other surgical history      hysterectomy  . Abdominal hysterectomy    . Hernia repair    . Cataract extraction    . Cardioversion N/A 02/27/2014    Procedure: CARDIOVERSION;  Surgeon: Carlena Bjornstad, MD;  Location[facility] END[facility];  Service: Cardiovascular;  Laterality: N/A;    Patient Active Problem List   Diagnosis Date Noted  . Skin rash 04/08/2014  . Edema leg 03/30/2014  . Hypothyroidism 03/09/2014  . Chronic anticoagulation 01/23/2014  . Ejection fraction   . Elevated brain natriuretic peptide (BNP) level 01/04/2014  . Atrial flutter 01/03/2014  . Chest  pressure 01/03/2014  . H/O Bell's palsy     ROS  Patient denies fever, chills, headache, sweats, rash, change in vision, change in hearing, chest pain, cough, nausea or vomiting, urinary symptoms. All other systems are reviewed and are negative.  PHYSICAL EXAM The patient is oriented to person time and place. Affect is normal. She is here with her daughter. Head is atraumatic. Sclera and conjunctiva are normal. There is no jugular venous distention. Lungs are clear. Respiratory effort is not labored. Cardiac exam reveals S1 and S2. The abdomen is soft. The patient has 2-3+ bilateral peripheral edema with secondary skin changes.  Filed Vitals:   05/08/14 1633  BP: 112/58  Pulse: 89  Height: 5\' 5"  (1.651 m)  Weight: 200 lb 3.2 oz (90.81 kg)    EKG is done today and reviewed  by me. She has continued atrial flutter. The rate is controlled.  ASSESSMENT & PLAN

## 2014-05-08 NOTE — Assessment & Plan Note (Signed)
Patient has persistent atrial flutter. Her rate is controlled. I suspect we may need to try to push for atrial flutter ablation. I'm not convinced that this is causing her swelling. However this has to be considered.

## 2014-05-08 NOTE — Assessment & Plan Note (Signed)
She has continued skin rash related to her edema in her lower extremities.

## 2014-05-08 NOTE — Assessment & Plan Note (Signed)
She is anticoagulated for her flutter.

## 2014-05-08 NOTE — Assessment & Plan Note (Signed)
The patient has marked bilateral edema. I suspect that she does have venous insufficiency along with volume overload. Since she hurt her shoulder, she has not been able to lie down in bed. Her legs have been dependent continuously. I had a long discussion with the patient and her daughter about finding a way to elevate her legs. They will probably by a medical reclining chair for this purpose. I strongly encouraged this. I've also increased her Lasix to 80 mg twice a day. She will take this between now and Monday afternoon when I will see her again to reassess.

## 2014-05-11 ENCOUNTER — Other Ambulatory Visit: Payer: Self-pay

## 2014-05-11 ENCOUNTER — Encounter: Payer: Self-pay | Admitting: Cardiology

## 2014-05-11 ENCOUNTER — Ambulatory Visit (INDEPENDENT_AMBULATORY_CARE_PROVIDER_SITE_OTHER): Payer: Medicare Other | Admitting: Cardiology

## 2014-05-11 VITALS — BP 104/64 | HR 116 | Ht 65.0 in | Wt 196.4 lb

## 2014-05-11 DIAGNOSIS — I483 Typical atrial flutter: Secondary | ICD-10-CM

## 2014-05-11 DIAGNOSIS — R21 Rash and other nonspecific skin eruption: Secondary | ICD-10-CM

## 2014-05-11 DIAGNOSIS — Z7901 Long term (current) use of anticoagulants: Secondary | ICD-10-CM

## 2014-05-11 DIAGNOSIS — R600 Localized edema: Secondary | ICD-10-CM

## 2014-05-11 MED ORDER — FUROSEMIDE 40 MG PO TABS: 80.0000 mg | ORAL_TABLET | Freq: Two times a day (BID) | ORAL | Status: DC

## 2014-05-11 NOTE — Patient Instructions (Signed)
Your physician recommends that you continue on your current medications as directed. Please refer to the Current Medication list given to you today.  Your physician recommends that you return for lab work in: today Artist)  Your physician recommends that you schedule a follow-up appointment in: next Wednesday or Thursday with a NP or PA.

## 2014-05-12 ENCOUNTER — Telehealth: Payer: Self-pay | Admitting: Cardiology

## 2014-05-12 LAB — BASIC METABOLIC PANEL
BUN: 33 mg/dL — ABNORMAL HIGH (ref 6–23)
CALCIUM: 8.9 mg/dL (ref 8.4–10.5)
CO2: 36 meq/L — AB (ref 19–32)
Chloride: 98 mEq/L (ref 96–112)
Creatinine, Ser: 2.2 mg/dL — ABNORMAL HIGH (ref 0.4–1.2)
GFR: 22.22 mL/min — ABNORMAL LOW (ref >60.00)
Glucose, Bld: 126 mg/dL — ABNORMAL HIGH (ref 70–99)
POTASSIUM: 3.3 meq/L — AB (ref 3.5–5.1)
SODIUM: 139 meq/L (ref 135–145)

## 2014-05-12 NOTE — Assessment & Plan Note (Addendum)
The patient's edema is somewhat improved but still marked. I plan to continue Lasix 80 mg twice a day.  I'm arranging for her to be seen in the office by one of our PA/NP team next week as I will be away. I'm hopeful that at that time we will see continued improvement in her edema, hopefully with her renal function remaining stable. Labs will have to be obtained on that day. They are not ordered yet. Decision can be made on that day about the continuing dose of her diuretics and early additional follow-up with me. The patient had not required diuretics in the past. It seems unlikely that she should need 80 mg of Lasix twice a day long-term. However she clearly needs it at this time. The fact that she requires such a large diuretic dose pushes me towards the idea of proceeding with atrial flutter ablation.

## 2014-05-12 NOTE — Progress Notes (Signed)
Patient ID: [name], female   DOB: 1927/12/29, 81 y.o.   MRN: [id number]    HPI Patient returns today for follow-up of atrial flutter and significant edema. I saw her last May 08, 2014. At that time she had marked edema and marked skin changes related to her edema. Her Lasix was increased to 80 twice a day. She and her daughter purchased a special electric reclining chair so that she could get her feet elevated. Positioning is difficult for her because of the injury to her shoulder which occurred recently also. Today she definitely looks better. She still has marked bilateral 2-3+ edema. However it is less marked than at the prior visit and her skin lesions look slightly better.  Allergies  Allergen Reactions  . Codeine Nausea And Vomiting  . Sulfa Antibiotics Nausea Only    Current Outpatient Prescriptions  Medication Sig Dispense Refill  . apixaban (ELIQUIS) 5 MG TABS tablet Take 1 tablet (5 mg total) by mouth 2 (two) times daily. 180 tablet 3  . beta carotene w/minerals (OCUVITE) tablet Take 1 tablet by mouth daily as needed (eyes).     Marland Kitchen diltiazem (DILACOR XR) 240 MG 24 hr capsule Take 1 capsule (240 mg total) by mouth daily. 90 capsule 3  . furosemide (LASIX) 40 MG tablet Take 2 tablets (80 mg total) by mouth 2 (two) times daily. 120 tablet 3  . levothyroxine (SYNTHROID, LEVOTHROID) 50 MCG tablet Take 50 mcg by mouth daily.  5  . metoprolol succinate (TOPROL-XL) 50 MG 24 hr tablet   11  . OVER THE COUNTER MEDICATION Tylenol--Not sure os dosage but stated uses PRN for pain    . oxyCODONE-acetaminophen (PERCOCET/ROXICET) 5-325 MG per tablet Take 0.5-1 tablets by mouth every 6 (six) hours as needed for severe pain. 20 tablet 0   No current facility-administered medications for this visit.    History   Social History  . Marital Status: Widowed    Spouse Name: N/A    Number of Children: N/A  . Years of Education: N/A   Occupational History  . Not on file.   Social  History Main Topics  . Smoking status: Former Smoker    Types: Cigarettes  . Smokeless tobacco: Not on file  . Alcohol Use: No  . Drug Use: No  . Sexual Activity: Not on file   Other Topics Concern  . Not on file   Social History Narrative    History reviewed. No pertinent family history.  Past Medical History  Diagnosis Date  . Hypertension   . Atrial fibrillation   . H/O Bell's palsy   . Cancer     breast,colon  . Ejection fraction     Past Surgical History  Procedure Laterality Date  . Breast lumpectomy      left  . Colectomy      and ventral hernia repair with mesh placement  . Other surgical history      hysterectomy  . Abdominal hysterectomy    . Hernia repair    . Cataract extraction    . Cardioversion N/A 02/27/2014    Procedure: CARDIOVERSION;  Surgeon: Carlena Bjornstad, MD;  Location[facility] ENDOSCOPY;  Service: Cardiovascular;  Laterality: N/A;    Patient Active Problem List   Diagnosis Date Noted  . Skin rash 04/08/2014  . Edema leg 03/30/2014  . Hypothyroidism 03/09/2014  . Chronic anticoagulation 01/23/2014  . Ejection fraction   . Elevated brain natriuretic peptide (BNP) level 01/04/2014  . Atrial  flutter 01/03/2014  . Chest pressure 01/03/2014  . H/O Bell's palsy     ROS  Patient denies fever, chills, headache, sweats, rash, change in vision, change in hearing, chest pain, cough, nausea or vomiting, urinary symptoms. She is having discomfort from her shoulder injury. All other systems are reviewed and are negative.  PHYSICAL EXAM The patient is oriented to person time and place. Affect is normal. She rambles a bit about paralleling medical issues. She is here with her daughter who helps with the overall care. Head is atraumatic. Sclerae and conjunctivae are normal. There is no jugular venous distention. She is wearing the sling for her left shoulder. Lungs are clear. Respiratory effort is nonlabored. Cardiac exam reveals S1 and S2. Abdomen is soft.  She still has 2-3+ pitting edema. There is significant erythema and some vesicular changes related to her edema on her legs. This is slightly improved from the last visit. She sitting in a wheelchair. Neurologic is grossly intact.  Filed Vitals:   05/11/14 1601  BP: 104/64  Pulse: 116  Height: 5\' 5"  (1.651 m)  Weight: 196 lb 6.4 oz (89.086 kg)  SpO2: 96%   EKG is done today and reviewed by me. She remains in atrial flutter. ASSESSMENT & PLAN

## 2014-05-12 NOTE — Assessment & Plan Note (Signed)
Patient has significant rash on both legs. I do believe this is all secondary to her marked edema. I'm hopeful this will improve without any further therapy other than treating her edema.

## 2014-05-12 NOTE — Assessment & Plan Note (Signed)
The patient remains in atrial flutter. The rate is controlled. Previous notes outlining the fact that the patient failed amiodarone after being loaded fully with a high dose and follow-up cardioversion. At this point it is not clear how much of a role this plays with her edema. However it may be most prudent to proceed with flutter ablation in this situation. I hope to stabilize her volume status before referring her for consideration of this.

## 2014-05-12 NOTE — Telephone Encounter (Signed)
New message      Want lab results and do her mother need to take potassium?

## 2014-05-12 NOTE — Assessment & Plan Note (Signed)
Patient is anticoagulated.

## 2014-05-12 NOTE — Telephone Encounter (Signed)
Gardiner Rhyme that mom's labs have not been resulted yet. Can't give her any info yet.

## 2014-05-13 ENCOUNTER — Other Ambulatory Visit: Payer: Self-pay

## 2014-05-13 DIAGNOSIS — R600 Localized edema: Secondary | ICD-10-CM

## 2014-05-13 DIAGNOSIS — I483 Typical atrial flutter: Secondary | ICD-10-CM

## 2014-05-13 MED ORDER — FUROSEMIDE 40 MG PO TABS: 40.0000 mg | ORAL_TABLET | Freq: Two times a day (BID) | ORAL | Status: DC

## 2014-05-15 ENCOUNTER — Other Ambulatory Visit: Payer: Self-pay

## 2014-05-15 ENCOUNTER — Other Ambulatory Visit (INDEPENDENT_AMBULATORY_CARE_PROVIDER_SITE_OTHER): Payer: Medicare Other | Admitting: *Deleted

## 2014-05-15 DIAGNOSIS — I483 Typical atrial flutter: Secondary | ICD-10-CM

## 2014-05-15 DIAGNOSIS — R600 Localized edema: Secondary | ICD-10-CM

## 2014-05-15 LAB — BASIC METABOLIC PANEL
BUN: 31 mg/dL — ABNORMAL HIGH (ref 6–23)
CALCIUM: 9.1 mg/dL (ref 8.4–10.5)
CO2: 33 mEq/L — ABNORMAL HIGH (ref 19–32)
Chloride: 99 mEq/L (ref 96–112)
Creatinine, Ser: 1.38 mg/dL — ABNORMAL HIGH (ref 0.40–1.20)
GFR: 38.65 mL/min — ABNORMAL LOW (ref >60.00)
Glucose, Bld: 143 mg/dL — ABNORMAL HIGH (ref 70–99)
Potassium: 3.2 mEq/L — ABNORMAL LOW (ref 3.5–5.1)
SODIUM: 139 meq/L (ref 135–145)

## 2014-05-15 MED ORDER — POTASSIUM CHLORIDE CRYS ER 20 MEQ PO TBCR: 40.0000 meq | EXTENDED_RELEASE_TABLET | Freq: Two times a day (BID) | ORAL | Status: DC

## 2014-05-15 NOTE — Addendum Note (Signed)
Addended by: Eulis Foster on: 05/15/2014 07:37 AM   Modules accepted: Orders

## 2014-05-16 ENCOUNTER — Telehealth: Payer: Self-pay | Admitting: Cardiology

## 2014-05-16 NOTE — Telephone Encounter (Signed)
Paged by Ms. [name] son in [employer].   She was recently started on potassium. Instructions told her to take 8oz of water with each half tab, so she was been taking 32oz of fluid per day.  She has had no problems swallowing the pills but feels bloated from all of the water.    We discussed that the main reason to take all of the water is to reduce the risk of esophageal irritation and that as long as she is not having problems with pills getting stuck and/or irritation, she can reduce the amount of fluid she takes with each pill.

## 2014-05-20 ENCOUNTER — Encounter: Payer: Self-pay | Admitting: Physician Assistant

## 2014-05-20 ENCOUNTER — Ambulatory Visit (INDEPENDENT_AMBULATORY_CARE_PROVIDER_SITE_OTHER): Payer: Medicare Other | Admitting: Physician Assistant

## 2014-05-20 VITALS — BP 112/62 | HR 72 | Ht 65.0 in | Wt 197.0 lb

## 2014-05-20 DIAGNOSIS — I10 Essential (primary) hypertension: Secondary | ICD-10-CM

## 2014-05-20 DIAGNOSIS — R609 Edema, unspecified: Secondary | ICD-10-CM

## 2014-05-20 DIAGNOSIS — I4892 Unspecified atrial flutter: Secondary | ICD-10-CM

## 2014-05-20 DIAGNOSIS — Z7901 Long term (current) use of anticoagulants: Secondary | ICD-10-CM

## 2014-05-20 DIAGNOSIS — R600 Localized edema: Secondary | ICD-10-CM

## 2014-05-20 DIAGNOSIS — N179 Acute kidney failure, unspecified: Secondary | ICD-10-CM

## 2014-05-20 LAB — D-DIMER, QUANTITATIVE (NOT AT ARMC): D-Dimer, Quant: 1.92 ug/mL-FEU — ABNORMAL HIGH (ref 0.00–0.48)

## 2014-05-20 NOTE — Progress Notes (Signed)
Cardiology Office Note  Date:  05/20/2014   Patient ID:  [name], [name] 1927/10/06, MRN [id number]  PCP:  Haywood Pao, MD  Cardiologist: Ron Parker  Chief Complaint: leg swelling  History of Present Illness: [name] is a 81 y.o. female with history of remote colon/breast CA, paroxysmal atrial flutter, HTN, and LE edema who presents for follow-up. She was diagnosed with newly recognized atrial flutter in 12/2013. During that admission she was placed on amiodarone with plans for TEE/DCCV but chemically cardioverted before this could be completed. 2D Echo 01/03/14: mild LVH, EF 55-60%, study not technically sufficient to eval LV diasotlic function, mildly dilated ascending aorta, mildly dilated LA/RA. When she returned back for f/u in 12/2013 she was back in atrial flutter. Amiodarone was continued and she underwent DCCV 01/2014. When seen in follow-up in 03/2014 she was back in atrial flutter again. Dr. Ron Parker decided to discontinue amiodarone and was contemplating referral for atrial flutter ablation. It was around that time that she developed lower extremity peripheral edema. She sustained a fall in 03/2014 and broke her left humerus. Dr. Ron Parker has been seeing her frequently over the last several weeks due to progressive edema with skin changes/weeping. He increased her Lasix to 80mg  BID. With IV diuresis she has developed some renal insufficiency - baseline 0.85, more recently 1.16->1.3->then 2.2 on 05/11/14. Dr. Ron Parker cut back her Lasix to 40mg  BID and recommended continued elevation of feet. On 05/15/14, KCl was added for potassium of 3.3; Cr had improved to 1.38. She has not enjoyed the size of the potassium pills and has had to cut them in smaller sizes to swallow them. She has been on Eliquis continuously and denies any missed doses. She feels her edema is slightly improved today (but still definitely present). Weeping has resolved and blistering is slowly improving. She denies any  awareness of her arrhythmia at all. Denies CP, SOB, orthopnea. She does not exercise but remains active with her ADLs as she is able to (with sling in place). Her daughter accompanied her to today's appointment.  Past Medical History  Diagnosis Date  . Hypertension   . Paroxysmal atrial flutter     a. Dx 12/2013 - spont conv to NSR, placed on amiodarone. Back in flutter later 12/2013. s/p DCCV 01/2014 but did not hold - amio discontinued at that time.  . H/O Bell's palsy   . Breast cancer   . Colon cancer     a. s/p resection 2002.  . Arthritis   . Bilateral leg edema     Past Surgical History  Procedure Laterality Date  . Breast lumpectomy      left  . Colectomy      and ventral hernia repair with mesh placement  . Other surgical history      hysterectomy  . Abdominal hysterectomy    . Hernia repair    . Cataract extraction    . Cardioversion N/A 02/27/2014    Procedure: CARDIOVERSION;  Surgeon: Carlena Bjornstad, MD;  Location: [facility] ENDOSCOPY;  Service: Cardiovascular;  Laterality: N/A;    Current Outpatient Prescriptions  Medication Sig Dispense Refill  . apixaban (ELIQUIS) 5 MG TABS tablet Take 1 tablet (5 mg total) by mouth 2 (two) times daily. 180 tablet 3  . beta carotene w/minerals (OCUVITE) tablet Take 1 tablet by mouth daily as needed (eyes).     Marland Kitchen diltiazem (DILACOR XR) 240 MG 24 hr capsule Take 1 capsule (240 mg total) by mouth daily. Elliott  capsule 3  . furosemide (LASIX) 40 MG tablet Take 1 tablet (40 mg total) by mouth 2 (two) times daily. 120 tablet 3  . levothyroxine (SYNTHROID, LEVOTHROID) 50 MCG tablet Take 50 mcg by mouth daily.  5  . metoprolol succinate (TOPROL-XL) 25 MG 24 hr tablet Take 25 mg by mouth daily. Take 1/2 tablet by mouth daily.    Marland Kitchen OVER THE COUNTER MEDICATION Tylenol--Not sure os dosage but stated uses PRN for pain    . oxyCODONE-acetaminophen (PERCOCET/ROXICET) 5-325 MG per tablet Take 0.5-1 tablets by mouth every 6 (six) hours as needed for severe  pain. 20 tablet 0  . potassium chloride SA (K-DUR,KLOR-CON) 20 MEQ tablet Take 2 tablets (40 mEq total) by mouth 2 (two) times daily. 120 tablet 3   No current facility-administered medications for this visit.    Allergies:   Codeine and Sulfa antibiotics   Social History:  The patient  reports that she has quit smoking. Her smoking use included Cigarettes. She does not have any smokeless tobacco history on file. She reports that she does not drink alcohol or use illicit drugs.   Family History:  The patient's family history includes Cancer - Prostate in her father; Diabetes in her mother.  ROS:  Please see the history of present illness. Otherwise, review of systems is negative for any bleeding - no BRBPR, melena, hematemesis All other systems are reviewed and otherwise negative.   PHYSICAL EXAM:  VS:  BP 112/62 mmHg  Pulse 72  Ht 5\' 5"  (1.651 m)  Wt 197 lb (89.359 kg)  BMI 32.78 kg/m2  SpO2 98% BMI: Body mass index is 32.78 kg/(m^2). Well nourished, well developed WF, in no acute distress HEENT: normocephalic, atraumatic Neck: no JVD Cardiac:  normal S1, S2; irreuglarly irregular with controlled rate; no murmur Lungs:  clear to auscultation bilaterally, no wheezing, rhonchi or rales Abd: soft, nontender, no hepatomegaly Ext: 1-2+ BLE edema, mild bilateral erythema and stasis skin changes/thickening Skin: warm and dry, no active weeping Neuro:  moves all extremities spontaneously, no focal abnormalities noted except hard of hearing  EKG:  Atrial flutter 76bpm variable block, no acute ST_T changes (This was ordered due to atrial flutter and continued edema)  Recent Labs: 01/03/2014: Magnesium 1.9; Pro B Natriuretic peptide (BNP) 838.2* 01/04/2014: ALT 23 02/08/2014: Hemoglobin 14.9; Platelets 161 03/09/2014: TSH 2.17 05/15/2014: BUN 31*; Creatinine 1.38*; Potassium 3.2*; Sodium 139  No results found for requested labs within last 365 days.   Estimated Creatinine Clearance: 33.5  mL/min (by C-G formula based on Cr of 1.38).   Wt Readings from Last 3 Encounters:  05/20/14 197 lb (89.359 kg)  05/11/14 196 lb 6.4 oz (89.086 kg)  05/08/14 200 lb 3.2 oz (90.81 kg)     Other studies reviewed: Additional studies/records reviewed today include: 2D echo summarized above.  ASSESSMENT AND PLAN:  1. Bilateral leg edema - progressive since last fall, about 2 months following recognition of atrial flutter. Etiology not totally clear - question diastolic CHF related to atrial flutter versus vascular insufficiency. Will recheck labs today to find out if K has improved, but will also add CMET (for albumin), BNP and d-dimer. My suspicion for DVT is low since she is on anticoagulation, however, she has history of malignancy and also sustained a fall in 12/2013. If d-dimer is positive, she will need LE venous duplex. The patient's daughter wants to double check the Lasix dose to make sure she is in fact taking 40mg  BID (she is pretty  sure she is on 1 tablet twice a day - however, she says at one point they were taking 4 tablets twice a day a while back but I am not sure where this came from). She will call our office with confirmation of dose. Further recommendations regarding Lasix adjustment will depend on labwork. I agree with Dr. Ron Parker that it's worth considering atrial flutter ablation especially since edema persists despite higher dose diuretics (which have been limited by renal insufficiency recently). As skin changes improve she hopefully will be able to utilize the TED hose soon. Salt/fluid restriction reinforced along with elevation of legs when seated. 2. Acute kidney injury - recheck BMET. 3. Paroxysmal atrial flutter - remains in atrial flutter. As above, refer to Dr. Rayann Heman for atrial flutter ablation. Continue rate control and apixaban for now. 4. Essential HTN - controlled.  5. Humeral fracture - has f/u with ortho next week.  Disposition: Will refer to Dr. Rayann Heman for  consideration of atrial flutter ablation. Will also tentatively place an appointment on the books for 1 month with Dr. Ron Parker (availability in the next few weeks) but I also plan to discuss with him in the interim.  Current medicines are reviewed at length with the patient today.  The patient did not have any concerns regarding medicines.  Raechel Ache PA-C 05/20/2014 4:26 PM     [place] [address] [place] [address] [phone] (office)  [phone] (fax)

## 2014-05-20 NOTE — Patient Instructions (Addendum)
Your physician recommends that you continue on your current medications as directed. Please refer to the Current Medication list given to you today.    LABS TODAY  BNP D DOMER AND CMET    FOLLOW UP WITH DR KATZ IN 4 WEEKS OR NEXT AVAIALBLE APP ON SAME DAY KATZ IN OFFICE    NEEDS REFFERAL TO AFIB CLINIC  TO BE CONSIDER FOR ABLATION

## 2014-05-21 ENCOUNTER — Other Ambulatory Visit: Payer: Self-pay

## 2014-05-21 ENCOUNTER — Telehealth: Payer: Self-pay | Admitting: Cardiology

## 2014-05-21 ENCOUNTER — Ambulatory Visit (HOSPITAL_COMMUNITY): Payer: Medicare Other | Attending: Physician Assistant | Admitting: Cardiology

## 2014-05-21 DIAGNOSIS — R600 Localized edema: Secondary | ICD-10-CM

## 2014-05-21 DIAGNOSIS — R791 Abnormal coagulation profile: Secondary | ICD-10-CM | POA: Diagnosis not present

## 2014-05-21 DIAGNOSIS — R7989 Other specified abnormal findings of blood chemistry: Secondary | ICD-10-CM

## 2014-05-21 LAB — COMPREHENSIVE METABOLIC PANEL
ALBUMIN: 4.2 g/dL (ref 3.5–5.2)
ALK PHOS: 84 U/L (ref 39–117)
ALT: 9 U/L (ref 0–35)
AST: 18 U/L (ref 0–37)
BUN: 27 mg/dL — AB (ref 6–23)
CHLORIDE: 100 meq/L (ref 96–112)
CO2: 31 mEq/L (ref 19–32)
Calcium: 9.6 mg/dL (ref 8.4–10.5)
Creatinine, Ser: 1.54 mg/dL — ABNORMAL HIGH (ref 0.40–1.20)
GFR: 34.06 mL/min — ABNORMAL LOW (ref >60.00)
Glucose, Bld: 107 mg/dL — ABNORMAL HIGH (ref 70–99)
Potassium: 3.8 mEq/L (ref 3.5–5.1)
SODIUM: 139 meq/L (ref 135–145)
Total Bilirubin: 1.3 mg/dL — ABNORMAL HIGH (ref 0.2–1.2)
Total Protein: 7.4 g/dL (ref 6.0–8.3)

## 2014-05-21 LAB — BRAIN NATRIURETIC PEPTIDE: Pro B Natriuretic peptide (BNP): 115 pg/mL — ABNORMAL HIGH (ref 0.0–100.0)

## 2014-05-21 NOTE — Telephone Encounter (Signed)
F/U        Pamala Hurry pt daughter calling, states to try reach her at [phone] for today only   Her phone is having bad connection.

## 2014-05-21 NOTE — Telephone Encounter (Signed)
**Note De-Identified  Obfuscation** I left a detailed message that all of the pts lab results have not come back yet and that as soon as they do and I discuss with [name], PAC I will call her back with recommendations.

## 2014-05-21 NOTE — Progress Notes (Signed)
Bilateral lower extremity venous duplex performed 

## 2014-05-21 NOTE — Telephone Encounter (Signed)
**Note De-Identified  Obfuscation** Per [name], PAC and due to the pts d-dimer results the pt needs to have a bilateral lower extremity venous duplex done today. The pts daughter, Pamala Hurry, is advised and she verbalized understanding.  Per Daphane Shepherd in the vascular dept, the pt is scheduled to have her lower extremity venous duplex done at this office today as soon as she can get here.  Pamala Hurry states that she will get the pt here as soon as she can leave work and pick her up to bring to the office. She states she is leaving work as soon as she possibly can.  Also, Pamala Hurry is advised that the pt should continue to take Lasix 40 mg BID. She verbalized understanding.

## 2014-05-21 NOTE — Telephone Encounter (Signed)
New Message      Patients daughter needs to know the dosage of lasix (furosemide) that her mom is taking. She also wants an appt to see Dr. Ron Parker in 4 weeks and she knows already that the schedule is full and wants to be working in.  Thanks,

## 2014-05-28 ENCOUNTER — Telehealth: Payer: Self-pay | Admitting: *Deleted

## 2014-05-28 NOTE — Telephone Encounter (Signed)
lmptcb to go over LEA results from [name], [place]. Voice mail said name of [name], though I did dial the # correctly.

## 2014-05-29 NOTE — Telephone Encounter (Signed)
S/w pt's daughter Pamala Hurry due to pt HOH. Daughter did state that they were both aware of the LEA results and she thanked me for calling and making sure.

## 2014-06-01 NOTE — Progress Notes (Signed)
Please call the patient's daughter to see how the patient is doing. Also plans to bring her in for me to see her in the office this coming Thursday if it can be arranged

## 2014-06-01 NOTE — Progress Notes (Signed)
**Note De-Identified  Obfuscation** The pts daughter, Pamala Hurry, states that the pt is doing better and that her legs are not as "puffy" as they were at her the last OV with [name], PAC. Pamala Hurry is asked to remind the pt to watch her daily salt and fluid intake and to elevate her legs as often as possible. Pamala Hurry states that the pt has been very carefully watching her salt and fluid intake and that she does elevated her legs often.  The pt is now scheduled to see Dr Ron Parker on 06/04/14 at 4 pm, Pamala Hurry agrees with date and time of OV.

## 2014-06-04 ENCOUNTER — Encounter: Payer: Self-pay | Admitting: Cardiology

## 2014-06-04 ENCOUNTER — Ambulatory Visit (INDEPENDENT_AMBULATORY_CARE_PROVIDER_SITE_OTHER): Payer: Medicare Other | Admitting: Cardiology

## 2014-06-04 VITALS — BP 118/52 | HR 70 | Ht 65.0 in | Wt 196.2 lb

## 2014-06-04 DIAGNOSIS — I4892 Unspecified atrial flutter: Secondary | ICD-10-CM

## 2014-06-04 DIAGNOSIS — N183 Chronic kidney disease, stage 3 unspecified: Secondary | ICD-10-CM

## 2014-06-04 DIAGNOSIS — R600 Localized edema: Secondary | ICD-10-CM

## 2014-06-04 DIAGNOSIS — R0789 Other chest pain: Secondary | ICD-10-CM

## 2014-06-04 NOTE — Patient Instructions (Addendum)
Your physician recommends that you return for lab work in: today  Your physician recommends that you schedule a follow-up appointment in: March

## 2014-06-04 NOTE — Assessment & Plan Note (Signed)
I believe that her problem is a combination of venous insufficiency and volume overload. I'm not convinced that this represents a problem related to other pathology.

## 2014-06-04 NOTE — Progress Notes (Signed)
Cardiology Office Note   Date:  06/04/2014   ID:  [name], [name] 12-Jan-1928, MRN [id number]  PCP:  Haywood Pao, MD  Cardiologist:  Dola Argyle, MD   Chief Complaint  Patient presents with  . Appointment    Follow-up atrial flutter and edema      History of Present Illness: [name] is a 81 y.o. female who presents for follow-up of atrial flutter and edema. I have had difficulties controlling the patient's atrial flutter rate. Recently it is under reasonable control. However there has been significant edema. When I increased her diuretics she had a significant increase in creatinine. The diuretic dose was reduced. She has slowly been diuresing. With this her skin lesions are slowly improving. However she still has significant edema. Overall she's stable. We have an appointment for her to be seen in March for the possibility of an atrial flutter ablation.    Past Medical History  Diagnosis Date  . Hypertension   . Paroxysmal atrial flutter     a. Dx 12/2013 - spont conv to NSR, placed on amiodarone. Back in flutter later 12/2013. s/p DCCV 01/2014 but did not hold - amio discontinued at that time.  . H/O Bell's palsy   . Breast cancer   . Colon cancer     a. s/p resection 2002.  . Arthritis   . Bilateral leg edema     Past Surgical History  Procedure Laterality Date  . Breast lumpectomy      left  . Colectomy      and ventral hernia repair with mesh placement  . Other surgical history      hysterectomy  . Abdominal hysterectomy    . Hernia repair    . Cataract extraction    . Cardioversion N/A 02/27/2014    Procedure: CARDIOVERSION;  Surgeon: Carlena Bjornstad, MD;  Location: [facility] ENDOSCOPY;  Service: Cardiovascular;  Laterality: N/A;    Patient Active Problem List   Diagnosis Date Noted  . CKD (chronic kidney disease) stage 3, GFR 30-59 ml/min 06/04/2014  . Skin rash 04/08/2014  . Edema leg 03/30/2014  . Hypothyroidism 03/09/2014  . Chronic  anticoagulation 01/23/2014  . Ejection fraction   . Elevated brain natriuretic peptide (BNP) level 01/04/2014  . Atrial flutter 01/03/2014  . Chest pressure 01/03/2014  . H/O Bell's palsy       Current Outpatient Prescriptions  Medication Sig Dispense Refill  . apixaban (ELIQUIS) 5 MG TABS tablet Take 1 tablet (5 mg total) by mouth 2 (two) times daily. 180 tablet 3  . diltiazem (DILACOR XR) 240 MG 24 hr capsule Take 1 capsule (240 mg total) by mouth daily. 90 capsule 3  . furosemide (LASIX) 40 MG tablet Take 1 tablet (40 mg total) by mouth 2 (two) times daily. 120 tablet 3  . levothyroxine (SYNTHROID, LEVOTHROID) 50 MCG tablet Take 50 mcg by mouth daily.  5  . metoprolol succinate (TOPROL-XL) 25 MG 24 hr tablet Take 25 mg by mouth daily. Take 1/2 tablet by mouth daily.    Marland Kitchen OVER THE COUNTER MEDICATION Tylenol--Not sure os dosage but stated uses PRN for pain    . oxyCODONE-acetaminophen (PERCOCET/ROXICET) 5-325 MG per tablet Take 0.5-1 tablets by mouth every 6 (six) hours as needed for severe pain. 20 tablet 0  . potassium chloride SA (K-DUR,KLOR-CON) 20 MEQ tablet Take 2 tablets (40 mEq total) by mouth 2 (two) times daily. 120 tablet 3   No current facility-administered medications for this  visit.    Allergies:   Codeine and Sulfa antibiotics    Social History:  The patient  reports that she has quit smoking. Her smoking use included Cigarettes. She does not have any smokeless tobacco history on file. She reports that she does not drink alcohol or use illicit drugs.   Family History:  The patient's family history includes Cancer - Prostate in her father; Diabetes in her mother.    ROS:  Please see the history of present illness.   Patient denies fever, chills, headache, sweats, rash, change in vision, change in hearing, chest pain, cough, nausea or vomiting, urinary symptoms. Her left arm is improving after the injury. She has some lymphedema in this arm. All other systems are reviewed  and are negative.     PHYSICAL EXAM: VS:  BP 118/52 mmHg  Pulse 70  Ht 5\' 5"  (1.651 m)  Wt 196 lb 3.2 oz (88.996 kg)  BMI 32.65 kg/m2 , The patient is here with her daughter. She is oriented to person time and place. Affect is normal. Head is atraumatic. Sclera and conjunctiva are normal. She has lymphedema in her left arm. Her arm is no longer in a sling. There is no jugular venous distention. Lungs are clear. Respiratory effort is not labored. Cardiac exam reveals an S1 and S2. The rhythm is irregular. Abdomen is soft. She still has 1-2+ peripheral edema. Her skin lesions are drying up but still present. There are no other musculoskeletal deformities.  EKG:   EKG is not done today.   Recent Labs: 01/03/2014: Magnesium 1.9 02/08/2014: Hemoglobin 14.9; Platelets 161 03/09/2014: TSH 2.17 05/20/2014: ALT 9; BUN 27*; Creatinine 1.54*; Potassium 3.8; Pro B Natriuretic peptide (BNP) 115.0*; Sodium 139    Lipid Panel No results found for: CHOL, TRIG, HDL, CHOLHDL, VLDL, LDLCALC, LDLDIRECT    Wt Readings from Last 3 Encounters:  06/04/14 196 lb 3.2 oz (88.996 kg)  05/20/14 197 lb (89.359 kg)  05/11/14 196 lb 6.4 oz (89.086 kg)      Current medicines are reviewed. We are working very carefully with the patient and her daughter to be sure she is taking her medications properly. I'm afraid that this may be part of the problem.      ASSESSMENT AND PLAN:

## 2014-06-04 NOTE — Assessment & Plan Note (Signed)
I did not check her EKG today. However I suspect that she is still in atrial flutter. I really do believe that we should proceed with atrial flutter ablation when we can. This may help significantly.

## 2014-06-04 NOTE — Assessment & Plan Note (Signed)
Chemistry is being checked today. We are still trying to verify exactly the dose of the diuretic she is taking. As soon as I have this we will finalize the plan on dosing as it relates to her current labs.

## 2014-06-05 LAB — BASIC METABOLIC PANEL
BUN: 33 mg/dL — AB (ref 6–23)
CHLORIDE: 100 meq/L (ref 96–112)
CO2: 30 meq/L (ref 19–32)
CREATININE: 1.86 mg/dL — AB (ref 0.40–1.20)
Calcium: 9.1 mg/dL (ref 8.4–10.5)
GFR: 27.39 mL/min — ABNORMAL LOW (ref >60.00)
Glucose, Bld: 99 mg/dL (ref 70–99)
POTASSIUM: 3.8 meq/L (ref 3.5–5.1)
Sodium: 137 mEq/L (ref 135–145)

## 2014-06-10 ENCOUNTER — Other Ambulatory Visit: Payer: Self-pay

## 2014-06-10 ENCOUNTER — Telehealth: Payer: Self-pay | Admitting: Cardiology

## 2014-06-10 DIAGNOSIS — N183 Chronic kidney disease, stage 3 unspecified: Secondary | ICD-10-CM

## 2014-06-10 NOTE — Telephone Encounter (Signed)
New message     Pt has been taken 2 tylenol every 6hrs for a week or two because of a broken arm.  Could this affect her liver function test results?  Please call

## 2014-06-10 NOTE — Telephone Encounter (Signed)
I spoke with the patient's daughter. She was just called about some recent labs the patient had and thought tylenol may be contributing to abnormal readings. I advised her we checked her renal function not her liver function. The patient is scheduled to follow up on Friday with her PCP. I have advised that the patient discuss alternatives to tylenol for pain control at this point. The patient's daughter voices understanding.

## 2014-06-23 ENCOUNTER — Other Ambulatory Visit: Payer: Medicare Other

## 2014-06-23 ENCOUNTER — Encounter: Payer: Self-pay | Admitting: Cardiology

## 2014-06-24 ENCOUNTER — Other Ambulatory Visit: Payer: Medicare Other

## 2014-06-24 ENCOUNTER — Telehealth: Payer: Self-pay | Admitting: Cardiology

## 2014-06-24 NOTE — Telephone Encounter (Signed)
New message     Daughter calling had lab work drawn at Dr. Osborne Casco will forward over to Dr. Ron Parker.

## 2014-06-26 NOTE — Telephone Encounter (Signed)
**Note De-Identified  Obfuscation** I have not received the pts lab results yet. I called Dr Loren Racer office and spoke with Rodena Piety. I requested that they re fax the pts lab results to me at [phone]. Rodena Piety agreed and is faxing results at this time.

## 2014-06-26 NOTE — Telephone Encounter (Signed)
**Note De-Identified  Obfuscation** I just received the pts lab results and have placed them in Dr Kae Heller folder for his review when he returns to office.

## 2014-07-02 ENCOUNTER — Other Ambulatory Visit: Payer: Self-pay

## 2014-07-04 ENCOUNTER — Other Ambulatory Visit: Payer: Self-pay | Admitting: Cardiology

## 2014-07-06 ENCOUNTER — Encounter: Payer: Self-pay | Admitting: Internal Medicine

## 2014-07-06 ENCOUNTER — Other Ambulatory Visit: Payer: Self-pay | Admitting: *Deleted

## 2014-07-06 ENCOUNTER — Ambulatory Visit (INDEPENDENT_AMBULATORY_CARE_PROVIDER_SITE_OTHER): Payer: Medicare Other | Admitting: Internal Medicine

## 2014-07-06 ENCOUNTER — Encounter: Payer: Self-pay | Admitting: *Deleted

## 2014-07-06 VITALS — BP 120/78 | HR 64 | Ht 65.0 in | Wt 192.4 lb

## 2014-07-06 DIAGNOSIS — R600 Localized edema: Secondary | ICD-10-CM

## 2014-07-06 DIAGNOSIS — I10 Essential (primary) hypertension: Secondary | ICD-10-CM

## 2014-07-06 DIAGNOSIS — I483 Typical atrial flutter: Secondary | ICD-10-CM

## 2014-07-06 MED ORDER — FUROSEMIDE 40 MG PO TABS: 40.0000 mg | ORAL_TABLET | Freq: Two times a day (BID) | ORAL | Status: DC

## 2014-07-06 NOTE — Patient Instructions (Signed)
Your physician has recommended that you have an ablation. Catheter ablation is a medical procedure used to treat some cardiac arrhythmias (irregular heartbeats). During catheter ablation, a long, thin, flexible tube is put into a blood vessel in your groin (upper thigh), or neck. This tube is called an ablation catheter. It is then guided to your heart through the blood vessel. Radio frequency waves destroy small areas of heart tissue where abnormal heartbeats may cause an arrhythmia to start. Please see the instruction sheet given to you today.   See instruction sheet for procedure 

## 2014-07-06 NOTE — Progress Notes (Signed)
Electrophysiology Office Note   Date:  07/06/2014   ID:  Sapir, Lavey 03-26-1928, MRN [id number]  PCP:  Haywood Pao, MD  Cardiologist:  Dr Ron Parker Primary Electrophysiologist: Thompson Grayer, MD    Chief Complaint  Patient presents with  . Follow-up    AFLUTTER     History of Present Illness: [name] is a 81 y.o. female who presents today for electrophysiology evaluation.   The patient has recurrent symptomatic atrial flutter.  She is referred for consideration of ablation.  She has had progressive edema and fatigue in the setting of atrial flutter.  She has been cardioverted and also treated with amiodarone without success.  Presently, she is being treated with a rate control strategy.  She is appropriately anticoagulated without interruption.  Her primary concern at this time is with edema.  She also has stable SOB.  Today, she denies symptoms of palpitations, chest pain, orthopnea, PND, claudication, dizziness, presyncope, syncope, bleeding, or neurologic sequela. The patient is tolerating medications without difficulties and is otherwise without complaint today.    Past Medical History  Diagnosis Date  . Hypertension   . Paroxysmal atrial flutter     a. Dx 12/2013 - spont conv to NSR, placed on amiodarone. Back in flutter later 12/2013. s/p DCCV 01/2014 but did not hold - amio discontinued at that time.  . H/O Bell's palsy   . Breast cancer   . Colon cancer     a. s/p resection 2002.  . Arthritis   . Bilateral leg edema    Past Surgical History  Procedure Laterality Date  . Breast lumpectomy      left  . Colectomy      and ventral hernia repair with mesh placement  . Other surgical history      hysterectomy  . Abdominal hysterectomy    . Hernia repair    . Cataract extraction    . Cardioversion N/A 02/27/2014    Procedure: CARDIOVERSION;  Surgeon: Carlena Bjornstad, MD;  Location: [facility] ENDOSCOPY;  Service: Cardiovascular;  Laterality: N/A;      Current Outpatient Prescriptions  Medication Sig Dispense Refill  . apixaban (ELIQUIS) 5 MG TABS tablet Take 1 tablet (5 mg total) by mouth 2 (two) times daily. 180 tablet 3  . diltiazem (DILACOR XR) 240 MG 24 hr capsule Take 1 capsule (240 mg total) by mouth daily. 90 capsule 3  . furosemide (LASIX) 40 MG tablet Take 1 tablet (40 mg total) by mouth 2 (two) times daily. 60 tablet 0  . levothyroxine (SYNTHROID, LEVOTHROID) 50 MCG tablet Take 50 mcg by mouth daily.  5  . metoprolol succinate (TOPROL-XL) 25 MG 24 hr tablet Take 1/2 tablet by mouth daily.    Marland Kitchen OVER THE COUNTER MEDICATION Tylenol--Not sure os dosage but stated uses PRN for pain    . potassium chloride SA (K-DUR,KLOR-CON) 20 MEQ tablet Take 2 tablets (40 mEq total) by mouth 2 (two) times daily. 120 tablet 3   No current facility-administered medications for this visit.    Allergies:   Codeine and Sulfa antibiotics   Social History:  The patient  reports that she has quit smoking. Her smoking use included Cigarettes. She does not have any smokeless tobacco history on file. She reports that she does not drink alcohol or use illicit drugs.   Family History:  The patient's  family history includes Cancer - Prostate in her father; Diabetes in her mother.    ROS:  Please see  the history of present illness.   All other systems are reviewed and negative.    PHYSICAL EXAM: VS:  BP 120/78 mmHg  Pulse 64  Ht 5\' 5"  (1.651 m)  Wt 192 lb 6.4 oz (87.272 kg)  BMI 32.02 kg/m2 , BMI Body mass index is 32.02 kg/(m^2). GEN: elderly, in no acute distress HEENT: normal Neck: no JVD, carotid bruits, or masses Cardiac: iRRR; no murmurs, rubs, or gallops,+2 edema  Respiratory:  clear to auscultation bilaterally, normal work of breathing GI: soft, nontender, nondistended, + BS MS: no deformity or atrophy Skin: warm and dry  Neuro:  R facial Bells palsy, Strength and sensation are intact Psych: euthymic mood, full affect  EKG:  EKG is  ordered today. The ekg ordered today shows atrial flutter (likely typical), nonspecific St/T changes   Recent Labs: 01/03/2014: Magnesium 1.9 02/08/2014: Hemoglobin 14.9; Platelets 161 03/09/2014: TSH 2.17 05/20/2014: ALT 9; Pro B Natriuretic peptide (BNP) 115.0* 06/04/2014: BUN 33*; Creatinine 1.86*; Potassium 3.8; Sodium 137    Lipid Panel  No results found for: CHOL, TRIG, HDL, CHOLHDL, VLDL, LDLCALC, LDLDIRECT   Wt Readings from Last 3 Encounters:  07/06/14 192 lb 6.4 oz (87.272 kg)  06/04/14 196 lb 3.2 oz (88.996 kg)  05/20/14 197 lb (89.359 kg)      Other studies Reviewed: Additional studies/ records that were reviewed today include: echo from 01/03/14  Review of the above records today demonstrates: preserved EF, LA 76mm, no significant valvular disease Dr Kae Heller notes are also reviewed   ASSESSMENT AND PLAN:  1.  Persistent symptomatic typical appearing atrial flutter She has failed medical therapy with amiodarone.  V rates have been hard to control.   Therapeutic strategies for atrial flutter including medicine and ablation were discussed in detail with the patient today. Risk, benefits, and alternatives to EP study and radiofrequency ablation were also discussed in detail today. These risks include but are not limited to stroke, bleeding, vascular damage, tamponade, perforation, damage to the heart and other structures, AV block requiring pacemaker, worsening renal function, and death. The patient understands these risk and wishes to proceed.  We will therefore proceed with catheter ablation at the next available time.  Continue current medical therapy in the interim.  The importance of anticoagulation without interruption was also reinforced today.  Chads2vasc score is at least 4  2. HTN Stable No change required today  3. Edema Hopefully will improve with ablation Weight reduction and low sodium diet are also encouraged   Current medicines are reviewed at length with  the patient today.   The patient does not have concerns regarding her medicines.  The following changes were made today:  none  Labs/ tests ordered today include:  Orders Placed This Encounter  Procedures  . Basic metabolic panel  . CBC with Differential  . EKG 12-Lead    Signed, Thompson Grayer, MD  07/06/2014 11:27 PM     [place] [address] [phone] (office) [phone] (fax)

## 2014-07-07 ENCOUNTER — Telehealth: Payer: Self-pay | Admitting: Internal Medicine

## 2014-07-07 LAB — BASIC METABOLIC PANEL
BUN: 24 mg/dL — ABNORMAL HIGH (ref 6–23)
CALCIUM: 9.4 mg/dL (ref 8.4–10.5)
CHLORIDE: 103 meq/L (ref 96–112)
CO2: 30 mEq/L (ref 19–32)
Creatinine, Ser: 1.29 mg/dL — ABNORMAL HIGH (ref 0.40–1.20)
GFR: 41.77 mL/min — AB (ref >60.00)
Glucose, Bld: 99 mg/dL (ref 70–99)
POTASSIUM: 4.4 meq/L (ref 3.5–5.1)
Sodium: 138 mEq/L (ref 135–145)

## 2014-07-07 LAB — CBC WITH DIFFERENTIAL/PLATELET
BASOS PCT: 0.3 % (ref 0.0–3.0)
Basophils Absolute: 0 10*3/uL (ref 0.0–0.1)
EOS ABS: 0.2 10*3/uL (ref 0.0–0.7)
Eosinophils Relative: 2.2 % (ref 0.0–5.0)
HCT: 42.8 % (ref 36.0–46.0)
Hemoglobin: 14.4 g/dL (ref 12.0–15.0)
Lymphocytes Relative: 23.7 % (ref 12.0–46.0)
Lymphs Abs: 2 10*3/uL (ref 0.7–4.0)
MCHC: 33.6 g/dL (ref 30.0–36.0)
MCV: 89.4 fl (ref 78.0–100.0)
Monocytes Absolute: 0.6 10*3/uL (ref 0.1–1.0)
Monocytes Relative: 6.6 % (ref 3.0–12.0)
NEUTROS PCT: 67.2 % (ref 43.0–77.0)
Neutro Abs: 5.8 10*3/uL (ref 1.4–7.7)
PLATELETS: 204 10*3/uL (ref 150.0–400.0)
RBC: 4.79 Mil/uL (ref 3.87–5.11)
RDW: 14.5 % (ref 11.5–15.5)
WBC: 8.6 10*3/uL (ref 4.0–10.5)

## 2014-07-07 NOTE — Telephone Encounter (Signed)
Pt's dtr wants to know if after ablation does pt need someone to stay with her the next day? pls call Pamala Hurry @ [phone]

## 2014-07-07 NOTE — Telephone Encounter (Signed)
Spoke with patient's daughter and let her know the typically patient's spend the night and are released by lunch the next day.  She will be okay at home for the daughter to return to work

## 2014-07-09 ENCOUNTER — Ambulatory Visit (HOSPITAL_COMMUNITY): Payer: Medicare Other | Admitting: Certified Registered Nurse Anesthetist

## 2014-07-09 ENCOUNTER — Encounter (HOSPITAL_COMMUNITY): Payer: Self-pay | Admitting: Certified Registered Nurse Anesthetist

## 2014-07-09 ENCOUNTER — Ambulatory Visit (HOSPITAL_COMMUNITY)
Admission: RE | Admit: 2014-07-09 | Discharge: 2014-07-09 | Disposition: A | Discharge status: Home / Self Care | Payer: Medicare Other | Source: Ambulatory Visit | Attending: Internal Medicine | Admitting: Internal Medicine

## 2014-07-09 ENCOUNTER — Encounter (HOSPITAL_COMMUNITY)
Admission: RE | Disposition: A | Discharge status: Home / Self Care | Payer: Self-pay | Source: Ambulatory Visit | Attending: Internal Medicine

## 2014-07-09 DIAGNOSIS — Z9079 Acquired absence of other genital organ(s): Secondary | ICD-10-CM | POA: Insufficient documentation

## 2014-07-09 DIAGNOSIS — M1990 Unspecified osteoarthritis, unspecified site: Secondary | ICD-10-CM | POA: Diagnosis not present

## 2014-07-09 DIAGNOSIS — I10 Essential (primary) hypertension: Secondary | ICD-10-CM | POA: Diagnosis not present

## 2014-07-09 DIAGNOSIS — Z853 Personal history of malignant neoplasm of breast: Secondary | ICD-10-CM | POA: Insufficient documentation

## 2014-07-09 DIAGNOSIS — Z79899 Other long term (current) drug therapy: Secondary | ICD-10-CM | POA: Diagnosis not present

## 2014-07-09 DIAGNOSIS — I483 Typical atrial flutter: Secondary | ICD-10-CM | POA: Diagnosis not present

## 2014-07-09 DIAGNOSIS — R600 Localized edema: Secondary | ICD-10-CM | POA: Insufficient documentation

## 2014-07-09 DIAGNOSIS — Z9049 Acquired absence of other specified parts of digestive tract: Secondary | ICD-10-CM | POA: Insufficient documentation

## 2014-07-09 DIAGNOSIS — I471 Supraventricular tachycardia: Secondary | ICD-10-CM | POA: Diagnosis present

## 2014-07-09 DIAGNOSIS — Z87891 Personal history of nicotine dependence: Secondary | ICD-10-CM | POA: Insufficient documentation

## 2014-07-09 DIAGNOSIS — Z7901 Long term (current) use of anticoagulants: Secondary | ICD-10-CM | POA: Diagnosis not present

## 2014-07-09 DIAGNOSIS — Z85038 Personal history of other malignant neoplasm of large intestine: Secondary | ICD-10-CM | POA: Diagnosis not present

## 2014-07-09 DIAGNOSIS — I4892 Unspecified atrial flutter: Secondary | ICD-10-CM | POA: Diagnosis present

## 2014-07-09 HISTORY — DX: Anxiety disorder, unspecified: F41.9

## 2014-07-09 HISTORY — DX: Malignant neoplasm of unspecified site of left female breast: C50.912

## 2014-07-09 HISTORY — PX: ATRIAL FLUTTER ABLATION: SHX5733

## 2014-07-09 HISTORY — DX: Hypothyroidism, unspecified: E03.9

## 2014-07-09 HISTORY — DX: Family history of other specified conditions: Z84.89

## 2014-07-09 HISTORY — DX: Unspecified chronic bronchitis: J42

## 2014-07-09 HISTORY — DX: Other specified postprocedural states: Z98.890

## 2014-07-09 HISTORY — DX: Anemia, unspecified: D64.9

## 2014-07-09 HISTORY — DX: Nausea with vomiting, unspecified: R11.2

## 2014-07-09 SURGERY — ATRIAL FLUTTER ABLATION
Anesthesia: General

## 2014-07-09 MED ORDER — PROPOFOL 10 MG/ML IV BOLUS
INTRAVENOUS | Status: DC | PRN
  Administered 2014-07-09: 100 mg via INTRAVENOUS

## 2014-07-09 MED ORDER — DEXAMETHASONE SODIUM PHOSPHATE 4 MG/ML IJ SOLN
INTRAMUSCULAR | Status: DC | PRN
  Administered 2014-07-09: 4 mg via INTRAVENOUS

## 2014-07-09 MED ORDER — PHENYLEPHRINE HCL 10 MG/ML IJ SOLN
10.0000 mg | INTRAVENOUS | Status: DC | PRN
  Administered 2014-07-09: 10 ug/min via INTRAVENOUS

## 2014-07-09 MED ORDER — SODIUM CHLORIDE 0.9 % IV SOLN
2.0000 ug/min | INTRAVENOUS | Status: DC
  Filled 2014-07-09: qty 2

## 2014-07-09 MED ORDER — ONDANSETRON HCL 4 MG/2ML IJ SOLN
INTRAMUSCULAR | Status: DC | PRN
  Administered 2014-07-09: 4 mg via INTRAVENOUS

## 2014-07-09 MED ORDER — SODIUM CHLORIDE 0.9 % IJ SOLN: 3.0000 mL | INTRAMUSCULAR | Status: DC | PRN

## 2014-07-09 MED ORDER — FENTANYL CITRATE 0.05 MG/ML IJ SOLN
25.0000 ug | INTRAMUSCULAR | Status: DC | PRN
Start: 2014-07-09 — End: 2014-07-09

## 2014-07-09 MED ORDER — SODIUM CHLORIDE 0.9 % IV SOLN
INTRAVENOUS | Status: DC
  Administered 2014-07-09 (×2): via INTRAVENOUS

## 2014-07-09 MED ORDER — SODIUM CHLORIDE 0.9 % IV SOLN: 250.0000 mL | INTRAVENOUS | Status: DC | PRN

## 2014-07-09 MED ORDER — ACETAMINOPHEN 325 MG PO TABS: 650.0000 mg | ORAL_TABLET | ORAL | Status: DC | PRN

## 2014-07-09 MED ORDER — EPHEDRINE SULFATE 50 MG/ML IJ SOLN
INTRAMUSCULAR | Status: DC | PRN
  Administered 2014-07-09: 5 mg via INTRAVENOUS

## 2014-07-09 MED ORDER — LEVOTHYROXINE SODIUM 50 MCG PO TABS
50.0000 ug | ORAL_TABLET | Freq: Every day | ORAL | Status: DC
  Filled 2014-07-09: qty 1

## 2014-07-09 MED ORDER — BUPIVACAINE HCL (PF) 0.25 % IJ SOLN
INTRAMUSCULAR | Status: AC
  Filled 2014-07-09: qty 30

## 2014-07-09 MED ORDER — LIDOCAINE HCL (CARDIAC) 20 MG/ML IV SOLN
INTRAVENOUS | Status: DC | PRN
  Administered 2014-07-09: 30 mg via INTRAVENOUS

## 2014-07-09 MED ORDER — ONDANSETRON HCL 4 MG/2ML IJ SOLN: 4.0000 mg | Freq: Four times a day (QID) | INTRAMUSCULAR | Status: DC | PRN

## 2014-07-09 MED ORDER — SODIUM CHLORIDE 0.9 % IJ SOLN: 3.0000 mL | Freq: Two times a day (BID) | INTRAMUSCULAR | Status: DC

## 2014-07-09 MED ORDER — PHENYLEPHRINE HCL 10 MG/ML IJ SOLN
INTRAMUSCULAR | Status: DC | PRN
  Administered 2014-07-09: 40 ug via INTRAVENOUS
  Administered 2014-07-09: 80 ug via INTRAVENOUS
  Administered 2014-07-09: 120 ug via INTRAVENOUS
  Administered 2014-07-09: 80 ug via INTRAVENOUS
  Administered 2014-07-09 (×3): 40 ug via INTRAVENOUS
  Administered 2014-07-09 (×4): 80 ug via INTRAVENOUS

## 2014-07-09 MED ORDER — APIXABAN 5 MG PO TABS
5.0000 mg | ORAL_TABLET | Freq: Two times a day (BID) | ORAL | Status: DC
  Administered 2014-07-09: 19:00:00 5 mg via ORAL
  Filled 2014-07-09: qty 1

## 2014-07-09 NOTE — Interval H&P Note (Signed)
History and Physical Interval Note:  07/09/2014 11:45 AM  [name]  has presented today for surgery, with the diagnosis of atrial flutter  The various methods of treatment have been discussed with the patient and family. After consideration of risks, benefits and other options for treatment, the patient has consented to  Procedure(s): ATRIAL FLUTTER ABLATION (N/A) as a surgical intervention .  The patient's history has been reviewed, patient examined, no change in status, stable for surgery.  I have reviewed the patient's chart and labs.  Questions were answered to the patient's satisfaction.     Thompson Grayer

## 2014-07-09 NOTE — Progress Notes (Addendum)
Site area: rt groin Site Prior to Removal:  Level 0 Pressure Applied For: 15 minutes Manual:   Yes  Patient Status During Pull:  stable Post Pull Site:  Level  0 Post Pull Instructions Given:   Post Pull Pulses Present: yes Dressing Applied:  tegaderm Bedrest begins @ 1518 Comments: 0 complications. IV saline locked.

## 2014-07-09 NOTE — Anesthesia Postprocedure Evaluation (Signed)
  Anesthesia Post-op Note  Patient: [name]  Procedure(s) Performed: Procedure(s): ATRIAL FLUTTER ABLATION (N/A)  Patient Location: PACU  Anesthesia Type:General  Level of Consciousness: awake and alert   Airway and Oxygen Therapy: Patient Spontanous Breathing  Post-op Pain: none  Post-op Assessment: Post-op Vital signs reviewed, Patient's Cardiovascular Status Stable and Respiratory Function Stable  Post-op Vital Signs: Reviewed  Filed Vitals:   07/09/14 1443  BP: 124/65  Pulse: 72  Temp: 36.8 C  Resp: 14    Complications: No apparent anesthesia complications

## 2014-07-09 NOTE — Progress Notes (Signed)
Doing well s/p atrial flutter ablation No complaints Exam is unchanged  DC to home after bedrest  Stop diltiazem Continue eliquis without interruption  I will see her in 4 weeks.  Thompson Grayer MD 07/09/2014 6:57 PM

## 2014-07-09 NOTE — Op Note (Signed)
PREPROCEDURE DIAGNOSIS: Typical-appearing atrial flutter.   POSTPROCEDURE DIAGNOSIS: Isthmus-dependent right atrial flutter.   PROCEDURES:  1. Comprehensive EP study.  2. Coronary sinus pacing and recording.  3. Mapping of SVT.  4. Ablation of SVT.   INTRODUCTION:  [name] is a 81 y.o. female with a history of symptomatic typical-appearing atrial flutter. He presents today for EP study and radiofrequency ablation.   DESCRIPTION OF THE PROCEDURE: Informed written consent was obtained, and the patient was brought to the electrophysiology lab in the fasting state. The patient was then adequately sedated with anesthesia as outlined in the anesthesia report. The patient's right groin was prepped and draped in the usual sterile fashion by the EP lab staff. Using a percutaneous Seldinger technique a 6, 7, and 8-French hemostasis sheaths were placed into the right common femoral vein. A 7- [employer] decapolar coronary sinus catheter was introduced through the right common femoral vein and advanced into the coronary sinus for recording and pacing from this location. A 6-French quadripolar Josephson catheter was introduced through the right common femoral vein and advanced into the right ventricle for recording and pacing. This catheter was then pulled back to the His bundle location. The patient presented to the electrophysiology lab in atrial flutter. The surface electrocardiogram was consistent with typical atrial flutter. The coronary sinus activation sequence was proximal to distal and suggestive of right atrial flutter.  The atrial flutter cycle length was 270 msec. Atrial entrainment mapping was then performed. When pacing from the left atrium, a long post pacing interval was observed. With entrainment mapping from the cavotricuspid isthmus, the post pacing interval was equal to the tachycardia cycle length and therefore suggestive of isthmus-dependent right atrial flutter. The  patient's QRS duration measured 95 msec with an RR interval of 538 msec and an HV interval of 36 msec. I elected to perform cavotricuspid isthmus ablation.   A [employer] 7-French  6mm ablation catheter was introduced through the right common femoral vein and advanced into the right atrium. Mapping of the cavotricuspid isthmus was performed which revealed a standard isthmus. A series of 5 radiofrequency applications were delivered along the cavotricuspid isthmus with a target temperature of 60 degrees with power of 70 watts. During the 3rd radiofrequency ablation lesion, the tachycardia slowed and then terminated. The patient remained in sinus rhythm thereafter. An additional 2 radiofrequency applications were then delivered with similar parameters in order to obtain complete bidirectional cavotricuspid isthmus block.   Following ablation, differential atrial pacing was performed from the low lateral right atrium. This confirmed complete bidirectional cavotricuspid isthmus  block with a stimulus to earliest atrial activation recorded bidirectional across the isthmus measuring 170 msec. The patient was observed without return of conduction through the isthmus.  Following ablation, the PR interval was 222 with QRS 93, msec and Qtc 448,  The average RR was 877.  The AH interval measured 139 msec with an HV interval of 39 msec. Rapid atrial pacing was performed, which revealed an AV Wenckebach cycle length of 480 msec with no evidence of PR greater than RR and no tachycardias induced when pacing down to a cycle length of 220 msec. Ventricular pacing was then performed which revealed VA dissociation when pacing at a cycle length of 600 msec.  The procedure was therefore considered completed. All catheters were removed, and the sheaths were aspirated and flushed. The sheaths were removed and hemostasis was assured. EBL<76ml. There were no early apparent complications.   CONCLUSIONS:  1.  Isthmus-dependent  right atrial flutter upon presentation, successfully ablated along the usual cavotricuspid isthmus.  2. Complete bidirectional cavotricuspid isthmus block achieved.  3. No inducible arrhythmias following ablation.  4. No early apparent complications.  [name],MD 07/09/2014 1:26 PM

## 2014-07-09 NOTE — Anesthesia Preprocedure Evaluation (Addendum)
Anesthesia Evaluation  Patient identified by MRN, date of birth, ID band Patient awake    Reviewed: Allergy & Precautions, H&P , NPO status , Patient's Chart, lab work & pertinent test results, reviewed documented beta blocker date and time   History of Anesthesia Complications Negative for: history of anesthetic complications  Airway Mallampati: II  TM Distance: >3 FB Neck ROM: Full    Dental no notable dental hx. (+) Teeth Intact, Dental Advisory Given   Pulmonary neg pulmonary ROS, former smoker,  breath sounds clear to auscultation  Pulmonary exam normal       Cardiovascular hypertension, Pt. on medications and Pt. on home beta blockers + dysrhythmias Atrial Fibrillation Rhythm:Irregular Rate:Normal     Neuro/Psych Bells' Palsy negative psych ROS   GI/Hepatic negative GI ROS, Neg liver ROS,   Endo/Other  Hypothyroidism Morbid obesity  Renal/GU Renal disease  negative genitourinary   Musculoskeletal  (+) Arthritis -, Osteoarthritis,    Abdominal   Peds negative pediatric ROS (+)  Hematology negative hematology ROS (+)   Anesthesia Other Findings   Reproductive/Obstetrics negative OB ROS                            Anesthesia Physical Anesthesia Plan  ASA: III  Anesthesia Plan: General   Post-op Pain Management:    Induction: Intravenous  Airway Management Planned: LMA  Additional Equipment:   Intra-op Plan:   Post-operative Plan: Extubation in OR  Informed Consent: I have reviewed the patients History and Physical, chart, labs and discussed the procedure including the risks, benefits and alternatives for the proposed anesthesia with the patient or authorized representative who has indicated his/her understanding and acceptance.   Dental advisory given  Plan Discussed with: CRNA  Anesthesia Plan Comments:         Anesthesia Quick Evaluation

## 2014-07-09 NOTE — Anesthesia Procedure Notes (Signed)
Procedure Name: LMA Insertion Date/Time: 07/09/2014 12:07 PM Performed by: Luciana Axe K Pre-anesthesia Checklist: Patient identified, Emergency Drugs available, Suction available, Patient being monitored and Timeout performed Patient Re-evaluated:Patient Re-evaluated prior to inductionOxygen Delivery Method: Circle system utilized Preoxygenation: Pre-oxygenation with 100% oxygen Intubation Type: IV induction Ventilation: Mask ventilation without difficulty LMA: LMA inserted LMA Size: 4.0 Number of attempts: 1 Placement Confirmation: positive ETCO2,  CO2 detector and breath sounds checked- equal and bilateral Tube secured with: Tape Dental Injury: Teeth and Oropharynx as per pre-operative assessment

## 2014-07-09 NOTE — Progress Notes (Signed)
Patient discharged to home, IV and telemetry removed.  Pt in no distress, no complaints, right groin soft, no bleeding noted.  Patient left floor in wheelchair, escorted by family and RN.

## 2014-07-09 NOTE — Transfer of Care (Signed)
Immediate Anesthesia Transfer of Care Note  Patient: [name]  Procedure(s) Performed: Procedure(s): ATRIAL FLUTTER ABLATION (N/A)  Patient Location: PACU  Anesthesia Type:General  Level of Consciousness: awake, alert  and oriented  Airway & Oxygen Therapy: Patient Spontanous Breathing and Patient connected to nasal cannula oxygen  Post-op Assessment: Report given to RN and Post -op Vital signs reviewed and stable  Post vital signs: Reviewed and stable  Last Vitals:  Filed Vitals:   07/09/14 0857  BP: 124/64  Pulse: 71  Temp: 36.4 C  Resp: 18    Complications: No apparent anesthesia complications

## 2014-07-09 NOTE — Discharge Instructions (Signed)
No driving for 4 days. No lifting over 5 lbs for 1 week. No sexual activity for 1 week.  Keep procedure site clean & dry. If you notice increased pain, swelling, bleeding or pus, call/return!  You may shower, but no soaking baths/hot tubs/pools for 1 week.  ° ° °

## 2014-07-09 NOTE — H&P (View-Only) (Signed)
Electrophysiology Office Note   Date:  07/06/2014   ID:  [name], [name] [birth year], MRN [id number]  PCP:  Megan Pao, MD  Cardiologist:  Dr [name] Primary Electrophysiologist: Megan Grayer, MD    Chief Complaint  Patient presents with  . Follow-up    AFLUTTER     History of Present Illness: [name] is a 81 y.o. female who presents today for electrophysiology evaluation.   The patient has recurrent symptomatic atrial flutter.  She is referred for consideration of ablation.  She has had progressive edema and fatigue in the setting of atrial flutter.  She has been cardioverted and also treated with amiodarone without success.  Presently, she is being treated with a rate control strategy.  She is appropriately anticoagulated without interruption.  Her primary concern at this time is with edema.  She also has stable SOB.  Today, she denies symptoms of palpitations, chest pain, orthopnea, PND, claudication, dizziness, presyncope, syncope, bleeding, or neurologic sequela. The patient is tolerating medications without difficulties and is otherwise without complaint today.    Past Medical History  Diagnosis Date  . Hypertension   . Paroxysmal atrial flutter     a. Dx 12/2013 - spont conv to NSR, placed on amiodarone. Back in flutter later 12/2013. s/p DCCV 01/2014 but did not hold - amio discontinued at that time.  . H/O Bell's palsy   . Breast cancer   . Colon cancer     a. s/p resection 2002.  . Arthritis   . Bilateral leg edema    Past Surgical History  Procedure Laterality Date  . Breast lumpectomy      left  . Colectomy      and ventral hernia repair with mesh placement  . Other surgical history      hysterectomy  . Abdominal hysterectomy    . Hernia repair    . Cataract extraction    . Cardioversion N/A 02/27/2014    Procedure: CARDIOVERSION;  Surgeon: Megan Bjornstad, MD;  Location: [facility] ENDOSCOPY;  Service: Cardiovascular;  Laterality: N/A;      Current Outpatient Prescriptions  Medication Sig Dispense Refill  . apixaban (ELIQUIS) 5 MG TABS tablet Take 1 tablet (5 mg total) by mouth 2 (two) times daily. 180 tablet 3  . diltiazem (DILACOR XR) 240 MG 24 hr capsule Take 1 capsule (240 mg total) by mouth daily. 90 capsule 3  . furosemide (LASIX) 40 MG tablet Take 1 tablet (40 mg total) by mouth 2 (two) times daily. 60 tablet 0  . levothyroxine (SYNTHROID, LEVOTHROID) 50 MCG tablet Take 50 mcg by mouth daily.  5  . metoprolol succinate (TOPROL-XL) 25 MG 24 hr tablet Take 1/2 tablet by mouth daily.    Marland Kitchen OVER THE COUNTER MEDICATION Tylenol--Not sure os dosage but stated uses PRN for pain    . potassium chloride SA (K-DUR,KLOR-CON) 20 MEQ tablet Take 2 tablets (40 mEq total) by mouth 2 (two) times daily. 120 tablet 3   No current facility-administered medications for this visit.    Allergies:   Codeine and Sulfa antibiotics   Social History:  The patient  reports that she has quit smoking. Her smoking use included Cigarettes. She does not have any smokeless tobacco history on file. She reports that she does not drink alcohol or use illicit drugs.   Family History:  The patient's  family history includes Cancer - Prostate in her father; Diabetes in her mother.    ROS:  Please see  the history of present illness.   All other systems are reviewed and negative.    PHYSICAL EXAM: VS:  BP 120/78 mmHg  Pulse 64  Ht 5\' 5"  (1.651 m)  Wt 192 lb 6.4 oz (87.272 kg)  BMI 32.02 kg/m2 , BMI Body mass index is 32.02 kg/(m^2). GEN: elderly, in no acute distress HEENT: normal Neck: no JVD, carotid bruits, or masses Cardiac: iRRR; no murmurs, rubs, or gallops,+2 edema  Respiratory:  clear to auscultation bilaterally, normal work of breathing GI: soft, nontender, nondistended, + BS MS: no deformity or atrophy Skin: warm and dry  Neuro:  R facial Bells palsy, Strength and sensation are intact Psych: euthymic mood, full affect  EKG:  EKG is  ordered today. The ekg ordered today shows atrial flutter (likely typical), nonspecific St/T changes   Recent Labs: 01/03/2014: Magnesium 1.9 02/08/2014: Hemoglobin 14.9; Platelets 161 03/09/2014: TSH 2.17 05/20/2014: ALT 9; Pro B Natriuretic peptide (BNP) 115.0* 06/04/2014: BUN 33*; Creatinine 1.86*; Potassium 3.8; Sodium 137    Lipid Panel  No results found for: CHOL, TRIG, HDL, CHOLHDL, VLDL, LDLCALC, LDLDIRECT   Wt Readings from Last 3 Encounters:  07/06/14 192 lb 6.4 oz (87.272 kg)  06/04/14 196 lb 3.2 oz (88.996 kg)  05/20/14 197 lb (89.359 kg)      Other studies Reviewed: Additional studies/ records that were reviewed today include: echo from 01/03/14  Review of the above records today demonstrates: preserved EF, LA 58mm, no significant valvular disease Dr [name] notes are also reviewed   ASSESSMENT AND PLAN:  1.  Persistent symptomatic typical appearing atrial flutter She has failed medical therapy with amiodarone.  V rates have been hard to control.   Therapeutic strategies for atrial flutter including medicine and ablation were discussed in detail with the patient today. Risk, benefits, and alternatives to EP study and radiofrequency ablation were also discussed in detail today. These risks include but are not limited to stroke, bleeding, vascular damage, tamponade, perforation, damage to the heart and other structures, AV block requiring pacemaker, worsening renal function, and death. The patient understands these risk and wishes to proceed.  We will therefore proceed with catheter ablation at the next available time.  Continue current medical therapy in the interim.  The importance of anticoagulation without interruption was also reinforced today.  Chads2vasc score is at least 4  2. HTN Stable No change required today  3. Edema Hopefully will improve with ablation Weight reduction and low sodium diet are also encouraged   Current medicines are reviewed at length with  the patient today.   The patient does not have concerns regarding her medicines.  The following changes were made today:  none  Labs/ tests ordered today include:  Orders Placed This Encounter  Procedures  . Basic metabolic panel  . CBC with Differential  . EKG 12-Lead    Signed, Megan Grayer, MD  07/06/2014 11:27 PM     [address] [place] [address] [phone] (office) [phone] (fax)

## 2014-07-17 ENCOUNTER — Ambulatory Visit: Payer: Medicare Other | Admitting: Cardiology

## 2014-07-17 ENCOUNTER — Ambulatory Visit (INDEPENDENT_AMBULATORY_CARE_PROVIDER_SITE_OTHER): Payer: Medicare Other | Admitting: Cardiology

## 2014-07-17 ENCOUNTER — Encounter: Payer: Self-pay | Admitting: Cardiology

## 2014-07-17 VITALS — BP 122/78 | HR 62 | Ht 65.0 in | Wt 184.0 lb

## 2014-07-17 DIAGNOSIS — N183 Chronic kidney disease, stage 3 unspecified: Secondary | ICD-10-CM

## 2014-07-17 DIAGNOSIS — R600 Localized edema: Secondary | ICD-10-CM

## 2014-07-17 DIAGNOSIS — I10 Essential (primary) hypertension: Secondary | ICD-10-CM

## 2014-07-17 DIAGNOSIS — Z7901 Long term (current) use of anticoagulants: Secondary | ICD-10-CM

## 2014-07-17 DIAGNOSIS — I483 Typical atrial flutter: Secondary | ICD-10-CM

## 2014-07-17 LAB — BASIC METABOLIC PANEL
BUN: 31 mg/dL — ABNORMAL HIGH (ref 6–23)
CHLORIDE: 100 meq/L (ref 96–112)
CO2: 29 meq/L (ref 19–32)
Calcium: 9.2 mg/dL (ref 8.4–10.5)
Creat: 1.27 mg/dL — ABNORMAL HIGH (ref 0.50–1.10)
Glucose, Bld: 89 mg/dL (ref 70–99)
POTASSIUM: 4.4 meq/L (ref 3.5–5.3)
SODIUM: 139 meq/L (ref 135–145)

## 2014-07-17 NOTE — Assessment & Plan Note (Signed)
Renal function will be rechecked today. I suspect she will require a lower dose of diuretics. She still has some edema.

## 2014-07-17 NOTE — Progress Notes (Signed)
Cardiology Office Note   Date:  07/17/2014   ID:  [name] 01/08/1928, MRN [id number]  PCP:  Megan Pao, MD  Cardiologist:  Megan Argyle, MD   Chief Complaint  Patient presents with  . Appointment    Follow-up edema and atrial flutter      History of Present Illness: [name] is a 81 y.o. female who presents today to follow-up edema and atrial flutter. Since seeing her last, the patient has been seen by [name], and she has undergone atrial flutter ablation. She has had an excellent response. She is in sinus rhythm. She is diuresing her edema. She looks great. She is happy as a clamp. Me too. She is fully active.    Past Medical History  Diagnosis Date  . Hypertension   . Paroxysmal atrial flutter     a. Dx 12/2013 - spont conv to NSR, placed on amiodarone. Back in flutter later 12/2013. s/p DCCV 01/2014 but did not hold - amio discontinued at that time.  . H/O Bell's palsy   . Bilateral leg edema   . PONV (postoperative nausea and vomiting)   . Family history of adverse reaction to anesthesia     "son has PONV"  . Coronary artery disease   . Hypothyroidism   . Chronic bronchitis     "usually get it q yr"  . Anemia   . Arthritis     "a little bit qwhere; mainly in my neck & shoulders" (07/09/2014)  . Anxiety   . Breast cancer, left breast     S/P chemo & radiation  . Colon cancer     a. s/p resection 2002.    Past Surgical History  Procedure Laterality Date  . Abdominal hysterectomy  1975  . Cataract extraction w/ intraocular lens  implant, bilateral Bilateral 2011  . Cardioversion N/A 02/27/2014    Procedure: CARDIOVERSION;  Surgeon: Megan Bjornstad, MD;  Location: [facility] ENDOSCOPY;  Service: Cardiovascular;  Laterality: N/A;  . Atrial flutter ablation  07/09/2014  . Hernia repair    . Abdominal hernia repair  01/2007  . Low anterior bowel resection  2002  . Appendectomy  1975  . Breast biopsy Left 1998  . Breast lumpectomy Left 1998   . Colectomy    . Atrial flutter ablation N/A 07/09/2014    Procedure: ATRIAL FLUTTER ABLATION;  Surgeon: Megan Grayer, MD;  Location: [facility] CATH LAB;  Service: Cardiovascular;  Laterality: N/A;    Patient Active Problem List   Diagnosis Date Noted  . SVT (supraventricular tachycardia) 07/09/2014  . Essential hypertension 07/06/2014  . CKD (chronic kidney disease) stage 3, GFR 30-59 ml/min 06/04/2014  . Skin rash 04/08/2014  . Edema leg 03/30/2014  . Hypothyroidism 03/09/2014  . Chronic anticoagulation 01/23/2014  . Ejection fraction   . Elevated brain natriuretic peptide (BNP) level 01/04/2014  . Atrial flutter 01/03/2014  . Chest pressure 01/03/2014  . H/O Bell's palsy       Current Outpatient Prescriptions  Medication Sig Dispense Refill  . ACETAMINOPHEN PO Take 1-2 tablets by mouth every 6 (six) hours as needed (pain).    Marland Kitchen apixaban (ELIQUIS) 5 MG TABS tablet Take 1 tablet (5 mg total) by mouth 2 (two) times daily. 180 tablet 3  . furosemide (LASIX) 40 MG tablet Take 1 tablet (40 mg total) by mouth 2 (two) times daily. 60 tablet 0  . levothyroxine (SYNTHROID, LEVOTHROID) 50 MCG tablet Take 50 mcg by mouth daily.  5  .  metoprolol succinate (TOPROL-XL) 25 MG 24 hr tablet Take 12.5 mg by mouth daily.     [name] Glycol-Propyl Glycol (SYSTANE OP) Place 1 drop into both eyes at bedtime.    . potassium chloride SA (K-DUR,KLOR-CON) 20 MEQ tablet Take 2 tablets (40 mEq total) by mouth 2 (two) times daily. 120 tablet 3   No current facility-administered medications for this visit.    Allergies:   Codeine and Sulfa antibiotics    Social History:  The patient  reports that she has quit smoking. Her smoking use included Cigarettes. She has a 10 pack-year smoking history. She has never used smokeless tobacco. She reports that she drinks alcohol. She reports that she does not use illicit drugs.   Family History:  The patient's family history includes Cancer - Prostate in her father;  Diabetes in her mother.    ROS:  Please see the history of present illness.     Patient denies fever, chills, headache, sweats, rash, change in vision, change in hearing, chest pain, cough, nausea or vomiting, urinary symptoms. All other systems are reviewed and are negative.   PHYSICAL EXAM: VS:  BP 122/78 mmHg  Pulse 62  Ht 5\' 5"  (1.651 m)  Wt 184 lb (83.462 kg)  BMI 30.62 kg/m2 , Patient is here with her daughter. She is greatly improved. Head is atraumatic. Sclerae and conjunctivae are normal. There is no jugular venous distention. She has a slight droop of the left side of her mouth that is old. There is no jugular venous distention. Lungs are clear. Respiratory effort is not labored. Cardiac exam reveals an S1 and S2. Abdomen is soft. She has only trace peripheral edema. Her legs are greatly improved. The skin on her lower legs is improved since the edema has improved.  EKG:   EKG is done today and reviewed by me. She is holding sinus rhythm.   Recent Labs: 01/03/2014: Magnesium 1.9 03/09/2014: TSH 2.17 05/20/2014: ALT 9; Pro B Natriuretic peptide (BNP) 115.0* 07/06/2014: BUN 24*; Creatinine 1.29*; Hemoglobin 14.4; Platelets 204.0; Potassium 4.4; Sodium 138    Lipid Panel No results found for: CHOL, TRIG, HDL, CHOLHDL, VLDL, LDLCALC, LDLDIRECT    Wt Readings from Last 3 Encounters:  07/17/14 184 lb (83.462 kg)  07/09/14 192 lb (87.091 kg)  07/06/14 192 lb 6.4 oz (87.272 kg)      Current medicines are reviewed  The patient's daughter understands the patient's medications.     ASSESSMENT AND PLAN:

## 2014-07-17 NOTE — Assessment & Plan Note (Signed)
Her edema is greatly improved. The problem is combination of venous insufficiency and atrial flutter. She is greatly improved. There is still some mild edema. Skin lesions on her legs are greatly improved with edema improvement.

## 2014-07-17 NOTE — Patient Instructions (Addendum)
Your physician recommends that you continue on your current medications as directed. Please refer to the Current Medication list given to you today.  Your physician recommends that you return for lab work in: today Artist)  Your physician recommends that you schedule a follow-up appointment in: 6 weeks  Your physician has requested that you regularly monitor and record your weight at home for 1 week then call [facility] at [phone] to report your daily weights.

## 2014-07-17 NOTE — Assessment & Plan Note (Signed)
The patient is doing very well since her flutter ablation. She is in sinus rhythm today. She has diuresed nicely. Renal function will be checked today.

## 2014-07-17 NOTE — Assessment & Plan Note (Signed)
We will continue anticoagulation for now. It is possible that anticoagulation can be stopped in the future.

## 2014-08-10 ENCOUNTER — Encounter: Payer: Self-pay | Admitting: Internal Medicine

## 2014-08-10 ENCOUNTER — Ambulatory Visit (INDEPENDENT_AMBULATORY_CARE_PROVIDER_SITE_OTHER): Payer: Medicare Other | Admitting: Internal Medicine

## 2014-08-10 VITALS — BP 122/60 | HR 67 | Ht 65.0 in | Wt 183.8 lb

## 2014-08-10 DIAGNOSIS — I483 Typical atrial flutter: Secondary | ICD-10-CM

## 2014-08-10 NOTE — Patient Instructions (Signed)
Your physician wants you to follow-up in: AS NEEDED WITH DR. ALLRED.   Your physician has recommended you make the following change in your medication:  1) STOP ELIQUIS

## 2014-08-10 NOTE — Progress Notes (Signed)
Electrophysiology Office Note   Date:  08/10/2014   ID:  [name], [name] 1927-10-02, MRN [id number]  PCP:  Haywood Pao, MD  Cardiologist:  Dr Ron Parker Primary Electrophysiologist: Thompson Grayer, MD    Chief Complaint  Patient presents with  . Atrial Flutter     History of Present Illness: [name] is a 81 y.o. female who presents today for electrophysiology evaluation.   Doing well s/p ablation.  No symptoms of arrhythmia.  Edema is a little better.  Denies procedure related complications.  Today, she denies symptoms of palpitations, chest pain, shortness of breath, orthopnea, PND, claudication, dizziness, presyncope, syncope, bleeding, or neurologic sequela. The patient is tolerating medications without difficulties and is otherwise without complaint today.    Past Medical History  Diagnosis Date  . Hypertension   . Paroxysmal atrial flutter     a. Dx 12/2013 - spont conv to NSR, placed on amiodarone. Back in flutter later 12/2013. s/p DCCV 01/2014 but did not hold - amio discontinued at that time.  . H/O Bell's palsy   . Bilateral leg edema   . PONV (postoperative nausea and vomiting)   . Family history of adverse reaction to anesthesia     "son has PONV"  . Coronary artery disease   . Hypothyroidism   . Chronic bronchitis     "usually get it q yr"  . Anemia   . Arthritis     "a little bit qwhere; mainly in my neck & shoulders" (07/09/2014)  . Anxiety   . Breast cancer, left breast     S/P chemo & radiation  . Colon cancer     a. s/p resection 2002.   Past Surgical History  Procedure Laterality Date  . Abdominal hysterectomy  1975  . Cataract extraction w/ intraocular lens  implant, bilateral Bilateral 2011  . Cardioversion N/A 02/27/2014    Procedure: CARDIOVERSION;  Surgeon: Carlena Bjornstad, MD;  Location: [facility] ENDOSCOPY;  Service: Cardiovascular;  Laterality: N/A;  . Atrial flutter ablation  07/09/2014  . Hernia repair    . Abdominal hernia repair   01/2007  . Low anterior bowel resection  2002  . Appendectomy  1975  . Breast biopsy Left 1998  . Breast lumpectomy Left 1998  . Colectomy    . Atrial flutter ablation N/A 07/09/2014    Procedure: ATRIAL FLUTTER ABLATION;  Surgeon: Thompson Grayer, MD;  Location: [facility] CATH LAB;  Service: Cardiovascular;  Laterality: N/A;     Current Outpatient Prescriptions  Medication Sig Dispense Refill  . ACETAMINOPHEN PO Take 1-2 tablets by mouth every 6 (six) hours as needed (pain). (500 mg tablet)    . furosemide (LASIX) 40 MG tablet Take 1 tablet (40 mg total) by mouth 2 (two) times daily. 60 tablet 0  . levothyroxine (SYNTHROID, LEVOTHROID) 50 MCG tablet Take 50 mcg by mouth daily.  5  . metoprolol succinate (TOPROL-XL) 25 MG 24 hr tablet Take 12.5 mg by mouth daily.     . Multiple Vitamins-Minerals (EYE VITAMINS PO) Chew 2 tablets by mouth daily    . Polyethyl Glycol-Propyl Glycol (SYSTANE OP) Place 1 drop into both eyes at bedtime.    . potassium chloride SA (K-DUR,KLOR-CON) 20 MEQ tablet Take 2 tablets (40 mEq total) by mouth 2 (two) times daily. 120 tablet 3   No current facility-administered medications for this visit.    Allergies:   Codeine and Sulfa antibiotics   Social History:  The patient  reports that she  has quit smoking. Her smoking use included Cigarettes. She has a 10 pack-year smoking history. She has never used smokeless tobacco. She reports that she drinks alcohol. She reports that she does not use illicit drugs.   Family History:  The patient's  family history includes Cancer - Prostate in her father; Diabetes in her mother.    ROS:  Please see the history of present illness.   All other systems are reviewed and negative.    PHYSICAL EXAM: VS:  BP 122/60 mmHg  Pulse 67  Ht 5\' 5"  (1.651 m)  Wt 183 lb 12.8 oz (83.371 kg)  BMI 30.59 kg/m2 , BMI Body mass index is 30.59 kg/(m^2). GEN: Well nourished, well developed, in no acute distress HEENT: normal Neck: no JVD, carotid  bruits, or masses Cardiac: RRR; no murmurs, rubs, or gallops, +1 edema  Respiratory:  clear to auscultation bilaterally, normal work of breathing GI: soft, nontender, nondistended, + BS MS: no deformity or atrophy Skin: warm and dry  Neuro:  Strength and sensation are intact Psych: euthymic mood, full affect  EKG:  EKG is ordered today. The ekg ordered today shows sinus rhythm   Recent Labs: 01/03/2014: Magnesium 1.9 03/09/2014: TSH 2.17 05/20/2014: ALT 9; Pro B Natriuretic peptide (BNP) 115.0* 07/06/2014: Hemoglobin 14.4; Platelets 204.0 07/17/2014: BUN 31*; Creatinine 1.27*; Potassium 4.4; Sodium 139    Lipid Panel  No results found for: CHOL, TRIG, HDL, CHOLHDL, VLDL, LDLCALC, LDLDIRECT   Wt Readings from Last 3 Encounters:  08/10/14 183 lb 12.8 oz (83.371 kg)  07/17/14 184 lb (83.462 kg)  07/09/14 192 lb (87.091 kg)     ASSESSMENT AND PLAN:  1.  Atrial flutter Resolved s/p ablation Stop eliquis Will need to follow with Dr Ron Parker for any further atrial arrhythmias.  She has not had afib previously. If she is documented to have af clinically then eliquis could be restarted  2. Edema Improved 2 gram sodium diet    Follow-up: with Dr Ron Parker as scheduled I will see as needed going forward   Signed, Thompson Grayer, MD  08/10/2014 4:54 PM     [address] [place] [address] [phone] (office) [phone] (fax)

## 2014-08-28 ENCOUNTER — Ambulatory Visit (INDEPENDENT_AMBULATORY_CARE_PROVIDER_SITE_OTHER): Payer: Medicare Other | Admitting: Cardiology

## 2014-08-28 ENCOUNTER — Encounter: Payer: Self-pay | Admitting: Cardiology

## 2014-08-28 VITALS — BP 128/60 | HR 69 | Ht 65.0 in | Wt 183.0 lb

## 2014-08-28 DIAGNOSIS — Z7901 Long term (current) use of anticoagulants: Secondary | ICD-10-CM | POA: Diagnosis not present

## 2014-08-28 DIAGNOSIS — R600 Localized edema: Secondary | ICD-10-CM

## 2014-08-28 DIAGNOSIS — I4892 Unspecified atrial flutter: Secondary | ICD-10-CM

## 2014-08-28 NOTE — Progress Notes (Signed)
Cardiology Office Note   Date:  08/28/2014   ID:  [name], [name] 08/08/1927, MRN [id number]  PCP:  Haywood Pao, MD  Cardiologist:  Dola Argyle, MD   Chief Complaint  Patient presents with  . Appointment    Follow-up atrial flutter and edema      History of Present Illness: [name] is a 81 y.o. female who presents to follow-up atrial flutter and significant leg edema. She is holding sinus rhythm after her flutter ablation. She looks great. She has only trace edema in the right leg. She still has 1+ in the left leg. She is fully active. She is also recovered from her arm injury.    Past Medical History  Diagnosis Date  . Hypertension   . Paroxysmal atrial flutter     a. Dx 12/2013 - spont conv to NSR, placed on amiodarone. Back in flutter later 12/2013. s/p DCCV 01/2014 but did not hold - amio discontinued at that time.  . H/O Bell's palsy   . Bilateral leg edema   . PONV (postoperative nausea and vomiting)   . Family history of adverse reaction to anesthesia     "son has PONV"  . Coronary artery disease   . Hypothyroidism   . Chronic bronchitis     "usually get it q yr"  . Anemia   . Arthritis     "a little bit qwhere; mainly in my neck & shoulders" (07/09/2014)  . Anxiety   . Breast cancer, left breast     S/P chemo & radiation  . Colon cancer     a. s/p resection 2002.    Past Surgical History  Procedure Laterality Date  . Abdominal hysterectomy  1975  . Cataract extraction w/ intraocular lens  implant, bilateral Bilateral 2011  . Cardioversion N/A 02/27/2014    Procedure: CARDIOVERSION;  Surgeon: Carlena Bjornstad, MD;  Location: [facility] ENDOSCOPY;  Service: Cardiovascular;  Laterality: N/A;  . Atrial flutter ablation  07/09/2014  . Hernia repair    . Abdominal hernia repair  01/2007  . Low anterior bowel resection  2002  . Appendectomy  1975  . Breast biopsy Left 1998  . Breast lumpectomy Left 1998  . Colectomy    . Atrial flutter ablation  N/A 07/09/2014    Procedure: ATRIAL FLUTTER ABLATION;  Surgeon: Thompson Grayer, MD;  Location: [facility] CATH LAB;  Service: Cardiovascular;  Laterality: N/A;    Patient Active Problem List   Diagnosis Date Noted  . SVT (supraventricular tachycardia) 07/09/2014  . Essential hypertension 07/06/2014  . CKD (chronic kidney disease) stage 3, GFR 30-59 ml/min 06/04/2014  . Skin rash 04/08/2014  . Edema leg 03/30/2014  . Hypothyroidism 03/09/2014  . Chronic anticoagulation 01/23/2014  . Ejection fraction   . Atrial flutter 01/03/2014  . Chest pressure 01/03/2014  . H/O Bell's palsy       Current Outpatient Prescriptions  Medication Sig Dispense Refill  . ACETAMINOPHEN PO Take 1-2 tablets by mouth every 6 (six) hours as needed (pain). (500 mg tablet)    . furosemide (LASIX) 40 MG tablet Take 1 tablet (40 mg total) by mouth 2 (two) times daily. 60 tablet 0  . levothyroxine (SYNTHROID, LEVOTHROID) 50 MCG tablet Take 50 mcg by mouth daily.  5  . metoprolol succinate (TOPROL-XL) 25 MG 24 hr tablet Take 12.5 mg by mouth daily.     . Multiple Vitamins-Minerals (EYE VITAMINS PO) Chew 2 tablets by mouth daily    . Polyethyl  Glycol-Propyl Glycol (SYSTANE OP) Place 1 drop into both eyes at bedtime.    . potassium chloride SA (K-DUR,KLOR-CON) 20 MEQ tablet Take 2 tablets (40 mEq total) by mouth 2 (two) times daily. 120 tablet 3   No current facility-administered medications for this visit.    Allergies:   Codeine and Sulfa antibiotics    Social History:  The patient  reports that she has quit smoking. Her smoking use included Cigarettes. She has a 10 pack-year smoking history. She has never used smokeless tobacco. She reports that she drinks alcohol. She reports that she does not use illicit drugs.   Family History:  The patient's family history includes Cancer - Prostate in her father; Diabetes in her mother.    ROS:  Please see the history of present illness.     Patient denies fever, chills,  headache, sweats, rash, change in vision, change in hearing, chest pain, cough, nausea or vomiting, urinary symptoms. All other systems are reviewed and are negative.   PHYSICAL EXAM: VS:  BP 128/60 mmHg  Pulse 69  Ht 5\' 5"  (1.651 m)  Wt 183 lb (83.008 kg)  BMI 30.45 kg/m2  SpO2 98% , Patient is here with her daughter. She is oriented to person time and place. Affect is normal. Head is atraumatic. Sclera and conjunctiva are normal. There is no jugular venous distention. Lungs are clear. Respiratory effort is not labored. Cardiac exam reveals S1 and S2. The rhythm is regular. The abdomen is soft. She has trace edema in the right leg and 1+ edema in the left leg. There are no musculoskeletal deformities. There are no skin rashes.  EKG:   EKG is not done today.   Recent Labs: 01/03/2014: Magnesium 1.9 03/09/2014: TSH 2.17 05/20/2014: ALT 9; Pro B Natriuretic peptide (BNP) 115.0* 07/06/2014: Hemoglobin 14.4; Platelets 204.0 07/17/2014: BUN 31*; Creatinine 1.27*; Potassium 4.4; Sodium 139    Lipid Panel No results found for: CHOL, TRIG, HDL, CHOLHDL, VLDL, LDLCALC, LDLDIRECT    Wt Readings from Last 3 Encounters:  08/28/14 183 lb (83.008 kg)  08/10/14 183 lb 12.8 oz (83.371 kg)  07/17/14 184 lb (83.462 kg)      Current medicines are reviewed  The patient seems to understand her medications.     ASSESSMENT AND PLAN:

## 2014-08-28 NOTE — Assessment & Plan Note (Signed)
The patient is holding sinus rhythm and doing extremely well. No change in therapy.

## 2014-08-28 NOTE — Patient Instructions (Signed)
Medication Instructions:  Same  Labwork: None  Testing/Procedures: None  Follow-Up: Your physician recommends that you schedule a follow-up appointment in: 8 weeks

## 2014-08-28 NOTE — Assessment & Plan Note (Signed)
Her leg edema is substantially improved. She does have continued venous insufficiency. We are arranging for her to get support hose.

## 2014-08-28 NOTE — Assessment & Plan Note (Signed)
The patient no longer requires anticoagulation.

## 2014-09-02 ENCOUNTER — Other Ambulatory Visit: Payer: Self-pay | Admitting: Cardiology

## 2014-10-08 ENCOUNTER — Other Ambulatory Visit: Payer: Self-pay | Admitting: Physician Assistant

## 2014-10-28 ENCOUNTER — Other Ambulatory Visit: Payer: Self-pay | Admitting: Cardiology

## 2014-10-30 ENCOUNTER — Encounter: Payer: Self-pay | Admitting: Cardiology

## 2014-10-30 ENCOUNTER — Ambulatory Visit (INDEPENDENT_AMBULATORY_CARE_PROVIDER_SITE_OTHER): Payer: Medicare Other | Admitting: Cardiology

## 2014-10-30 VITALS — BP 104/50 | HR 64 | Ht 65.0 in | Wt 179.2 lb

## 2014-10-30 DIAGNOSIS — I10 Essential (primary) hypertension: Secondary | ICD-10-CM | POA: Diagnosis not present

## 2014-10-30 DIAGNOSIS — I4892 Unspecified atrial flutter: Secondary | ICD-10-CM | POA: Diagnosis not present

## 2014-10-30 DIAGNOSIS — R600 Localized edema: Secondary | ICD-10-CM

## 2014-10-30 NOTE — Patient Instructions (Signed)
Medication Instructions:  Same-no change  Labwork: BMET today  Testing/Procedures: None  Follow-Up: Your physician wants you to follow-up in: 6 months. You will receive a reminder letter in the mail two months in advance. If you don't receive a letter, please call our office to schedule the follow-up appointment.

## 2014-10-30 NOTE — Assessment & Plan Note (Signed)
Edema is markedly improved with the treatment of her atrial flutter. She probably still has some venous insufficiency. She will wear her support hose when she is going to be up and around a lot. Chemistry will be checked today to be sure that she is stable on her diuretics dose.

## 2014-10-30 NOTE — Assessment & Plan Note (Signed)
Atrial flutter was ablated. She is off amiodarone. No further workup.

## 2014-10-30 NOTE — Telephone Encounter (Signed)
Per note 4.29.16 

## 2014-10-30 NOTE — Progress Notes (Signed)
Cardiology Office Note   Date:  10/30/2014   ID:  [name], [name] Jun 13, 1927, MRN [id number]  PCP:  Haywood Pao, MD  Cardiologist:  Dola Argyle, MD   Chief Complaint  Patient presents with  . Appointment    Follow-up atrial flutter and CHF      History of Present Illness: [name] is a 81 y.o. female who presents today to follow-up atrial flutter and significant leg edema. She continues to look very good. Since her atrial flutter ablation, her edema has decreased remarkably. She does wear support hose many days. She still requires diuretics. However she is fully active and doing very well.  Past Medical History  Diagnosis Date  . Hypertension   . Paroxysmal atrial flutter     a. Dx 12/2013 - spont conv to NSR, placed on amiodarone. Back in flutter later 12/2013. s/p DCCV 01/2014 but did not hold - amio discontinued at that time.  . H/O Bell's palsy   . Bilateral leg edema   . PONV (postoperative nausea and vomiting)   . Family history of adverse reaction to anesthesia     "son has PONV"  . Coronary artery disease   . Hypothyroidism   . Chronic bronchitis     "usually get it q yr"  . Anemia   . Arthritis     "a little bit qwhere; mainly in my neck & shoulders" (07/09/2014)  . Anxiety   . Breast cancer, left breast     S/P chemo & radiation  . Colon cancer     a. s/p resection 2002.    Past Surgical History  Procedure Laterality Date  . Abdominal hysterectomy  1975  . Cataract extraction w/ intraocular lens  implant, bilateral Bilateral 2011  . Cardioversion N/A 02/27/2014    Procedure: CARDIOVERSION;  Surgeon: Carlena Bjornstad, MD;  Location: [facility] ENDOSCOPY;  Service: Cardiovascular;  Laterality: N/A;  . Atrial flutter ablation  07/09/2014  . Hernia repair    . Abdominal hernia repair  01/2007  . Low anterior bowel resection  2002  . Appendectomy  1975  . Breast biopsy Left 1998  . Breast lumpectomy Left 1998  . Colectomy    . Atrial flutter  ablation N/A 07/09/2014    Procedure: ATRIAL FLUTTER ABLATION;  Surgeon: Thompson Grayer, MD;  Location: [facility] CATH LAB;  Service: Cardiovascular;  Laterality: N/A;    Patient Active Problem List   Diagnosis Date Noted  . SVT (supraventricular tachycardia) 07/09/2014  . Essential hypertension 07/06/2014  . CKD (chronic kidney disease) stage 3, GFR 30-59 ml/min 06/04/2014  . Skin rash 04/08/2014  . Edema leg 03/30/2014  . Hypothyroidism 03/09/2014  . Chronic anticoagulation 01/23/2014  . Ejection fraction   . Atrial flutter 01/03/2014  . Chest pressure 01/03/2014  . H/O Bell's palsy       Current Outpatient Prescriptions  Medication Sig Dispense Refill  . ACETAMINOPHEN PO Take 1-2 tablets by mouth every 6 (six) hours as needed (pain). (500 mg tablet)    . furosemide (LASIX) 40 MG tablet TAKE 1 TABLET (40 MG TOTAL) BY MOUTH 2 (TWO) TIMES DAILY. 60 tablet 3  . KLOR-CON M20 20 MEQ tablet TAKE 2 TABLETS BY MOUTH TWICE A DAY 120 tablet 0  . levothyroxine (SYNTHROID, LEVOTHROID) 50 MCG tablet Take 50 mcg by mouth daily.  5  . metoprolol succinate (TOPROL-XL) 25 MG 24 hr tablet TAKE 0.5 TABLETS BY MOUTH DAILY. TAKE WITH OR IMMEDIATELY FOLLOWING A MEAL. 15 tablet  6  . Multiple Vitamins-Minerals (EYE VITAMINS PO) Chew 2 tablets by mouth daily    . Polyethyl Glycol-Propyl Glycol (SYSTANE OP) Place 1 drop into both eyes at bedtime.     No current facility-administered medications for this visit.    Allergies:   Codeine and Sulfa antibiotics    Social History:  The patient  reports that she has quit smoking. Her smoking use included Cigarettes. She has a 10 pack-year smoking history. She has never used smokeless tobacco. She reports that she drinks alcohol. She reports that she does not use illicit drugs.   Family History:  The patient'sfamily history includes Cancer - Prostate in her father; Diabetes in her mother.    ROS:  Please see the history of present illness.     Patient denies fever,  chills, headache, sweats, rash, change in vision, change in hearing, chest pain, cough, nausea or vomiting, urinary symptoms. All other systems are reviewed and are negative.   PHYSICAL EXAM: VS:  BP 104/50 mmHg  Pulse 64  Ht 5\' 5"  (1.651 m)  Wt 179 lb 3.2 oz (81.285 kg)  BMI 29.82 kg/m2 , Patient is oriented to person time and place. Affect is normal. She is here with her daughter. Head is atraumatic. Sclera and conjunctiva are normal. There is no jugulovenous distention. Lungs are clear. Respiratory effort is nonlabored. Cardiac exam reveals an S1 and S2. The abdomen is soft. There is 1+ peripheral edema in her left leg. Trace edema in the right leg. There are no skin rashes.  EKG:   EKG is not done today.   Recent Labs: 01/03/2014: Magnesium 1.9 03/09/2014: TSH 2.17 05/20/2014: ALT 9; Pro B Natriuretic peptide (BNP) 115.0* 07/06/2014: Hemoglobin 14.4; Platelets 204.0 07/17/2014: BUN 31*; Creat 1.27*; Potassium 4.4; Sodium 139    Lipid Panel No results found for: CHOL, TRIG, HDL, CHOLHDL, VLDL, LDLCALC, LDLDIRECT    Wt Readings from Last 3 Encounters:  10/30/14 179 lb 3.2 oz (81.285 kg)  08/28/14 183 lb (83.008 kg)  08/10/14 183 lb 12.8 oz (83.371 kg)      Current medicines are reviewed  The patient understands her medications.     ASSESSMENT AND PLAN:

## 2014-10-31 LAB — BASIC METABOLIC PANEL
BUN: 29 mg/dL — AB (ref 6–23)
CO2: 28 mEq/L (ref 19–32)
CREATININE: 1.18 mg/dL — AB (ref 0.50–1.10)
Calcium: 9 mg/dL (ref 8.4–10.5)
Chloride: 99 mEq/L (ref 96–112)
GLUCOSE: 71 mg/dL (ref 70–99)
Potassium: 4.7 mEq/L (ref 3.5–5.3)
Sodium: 141 mEq/L (ref 135–145)

## 2014-11-02 ENCOUNTER — Other Ambulatory Visit: Payer: Self-pay | Admitting: Cardiology

## 2014-12-20 ENCOUNTER — Other Ambulatory Visit: Payer: Self-pay | Admitting: Cardiology

## 2015-03-19 ENCOUNTER — Encounter (HOSPITAL_COMMUNITY): Payer: Self-pay | Admitting: Emergency Medicine

## 2015-03-19 ENCOUNTER — Emergency Department (HOSPITAL_COMMUNITY)
Admission: EM | Admit: 2015-03-19 | Discharge: 2015-03-19 | Disposition: A | Discharge status: Home / Self Care | Payer: Medicare Other | Attending: Emergency Medicine | Admitting: Emergency Medicine

## 2015-03-19 DIAGNOSIS — I2510 Atherosclerotic heart disease of native coronary artery without angina pectoris: Secondary | ICD-10-CM | POA: Insufficient documentation

## 2015-03-19 DIAGNOSIS — R040 Epistaxis: Secondary | ICD-10-CM | POA: Diagnosis present

## 2015-03-19 DIAGNOSIS — Z87891 Personal history of nicotine dependence: Secondary | ICD-10-CM | POA: Diagnosis not present

## 2015-03-19 DIAGNOSIS — E039 Hypothyroidism, unspecified: Secondary | ICD-10-CM | POA: Diagnosis not present

## 2015-03-19 DIAGNOSIS — M1990 Unspecified osteoarthritis, unspecified site: Secondary | ICD-10-CM | POA: Insufficient documentation

## 2015-03-19 DIAGNOSIS — Z85038 Personal history of other malignant neoplasm of large intestine: Secondary | ICD-10-CM | POA: Diagnosis not present

## 2015-03-19 DIAGNOSIS — Z8669 Personal history of other diseases of the nervous system and sense organs: Secondary | ICD-10-CM | POA: Insufficient documentation

## 2015-03-19 DIAGNOSIS — Z8659 Personal history of other mental and behavioral disorders: Secondary | ICD-10-CM | POA: Diagnosis not present

## 2015-03-19 DIAGNOSIS — Z853 Personal history of malignant neoplasm of breast: Secondary | ICD-10-CM | POA: Insufficient documentation

## 2015-03-19 DIAGNOSIS — Z862 Personal history of diseases of the blood and blood-forming organs and certain disorders involving the immune mechanism: Secondary | ICD-10-CM | POA: Insufficient documentation

## 2015-03-19 DIAGNOSIS — I10 Essential (primary) hypertension: Secondary | ICD-10-CM | POA: Diagnosis not present

## 2015-03-19 DIAGNOSIS — Z8709 Personal history of other diseases of the respiratory system: Secondary | ICD-10-CM | POA: Diagnosis not present

## 2015-03-19 DIAGNOSIS — Z79899 Other long term (current) drug therapy: Secondary | ICD-10-CM | POA: Diagnosis not present

## 2015-03-19 NOTE — Discharge Instructions (Signed)
Nosebleed Nosebleeds are common. A nosebleed can be caused by many things, including:  Getting hit hard in the nose.  Infections.  Dryness in your nose.  A dry climate.  Medicines.  Picking your nose.  Your home heating and cooling systems. HOME CARE   Try controlling your nosebleed by pinching your nostrils gently. Do this for at least 10 minutes.  Avoid blowing or sniffing your nose for a number of hours after having a nosebleed.  Do not put gauze inside of your nose yourself. If your nose was packed by your doctor, try to keep the pack inside of your nose until your doctor removes it.  If a gauze pack was used and it starts to fall out, gently replace it or cut off the end of it.  If a balloon catheter was used to pack your nose, do not cut or remove it unless told by your doctor.  Avoid lying down while you are having a nosebleed. Sit up and lean forward.  Use a nasal spray decongestant to help with a nosebleed as told by your doctor.  Do not use petroleum jelly or mineral oil in your nose. These can drip into your lungs.  Keep your house humid by using:  Less air conditioning.  A humidifier.  Aspirin and blood thinners make bleeding more likely. If you are prescribed these medicines and you have nosebleeds, ask your doctor if you should stop taking the medicines or adjust the dose. Do not stop medicines unless told by your doctor.  Resume your normal activities as you are able. Avoid straining, lifting, or bending at your waist for several days.  If your nosebleed was caused by dryness in your nose, use over-the-counter saline nasal spray or gel. If you must use a lubricant:  Choose one that is water-soluble.  Use it only as needed.  Do not use it within several hours of lying down.  Keep all follow-up visits as told by your doctor. This is important. GET HELP IF:  You have a fever.  You get frequent nosebleeds.  You are getting nosebleeds more  often. GET HELP RIGHT AWAY IF:  Your nosebleed lasts longer than 20 minutes.  Your nosebleed occurs after an injury to your face, and your nose looks crooked or broken.  You have unusual bleeding from other parts of your body.  You have unusual bruising on other parts of your body.  You feel light-headed or dizzy.  You become sweaty.  You throw up (vomit) blood.  You have a nosebleed after a head injury.   This information is not intended to replace advice given to you by your health care provider. Make sure you discuss any questions you have with your health care provider.   Document Released: 01/25/2008 Document Revised: 05/08/2014 Document Reviewed: 12/01/2013 Elsevier Interactive Patient Education 2016 [place] in bedroom, saline drops, Neosporin ointment, ice pack, pressure.  Do not blow your nose or bend over aggressively. Follow-up with your nose and throat doctor this afternoon at 1:30.   Nurse will give your information.

## 2015-03-19 NOTE — ED Notes (Addendum)
Patient states when she woke up she felt like something was in her nose, she put her finger in her nose and her nose started bleeding. Patient states her nose was "pouring out". Denies pain. Accompanied by her family. Denies injury. Bleeding appears to be stopped at this time, patient removed the napkin from her nose and a large clot came out with it.

## 2015-03-20 NOTE — ED Provider Notes (Signed)
CSN: [id number]     Arrival date & time 03/19/15  [id number] History   First MD Initiated Contact with Patient 03/19/15 [phone]     Chief Complaint  Patient presents with  . Epistaxis     (Consider location/radiation/quality/duration/timing/severity/associated sxs/prior Treatment) HPI..... Left-sided nosebleed since earlier this morning. No prodromal illnesses. No blood thinners. This is an unusual event. Symptoms started after picking her nose. Severity is moderate.  Past Medical History  Diagnosis Date  . Hypertension   . Paroxysmal atrial flutter ([place])     a. Dx 12/2013 - spont conv to NSR, placed on amiodarone. Back in flutter later 12/2013. s/p DCCV 01/2014 but did not hold - amio discontinued at that time.  . H/O Bell's palsy   . Bilateral leg edema   . PONV (postoperative nausea and vomiting)   . Family history of adverse reaction to anesthesia     "son has PONV"  . Coronary artery disease   . Hypothyroidism   . Chronic bronchitis ([place])     "usually get it q yr"  . Anemia   . Arthritis     "a little bit qwhere; mainly in my neck & shoulders" (07/09/2014)  . Anxiety   . Breast cancer, left breast ([place])     S/P chemo & radiation  . Colon cancer ([place])     a. s/p resection 2002.   Past Surgical History  Procedure Laterality Date  . Abdominal hysterectomy  1975  . Cataract extraction w/ intraocular lens  implant, bilateral Bilateral 2011  . Cardioversion N/A 02/27/2014    Procedure: CARDIOVERSION;  Surgeon: Carlena Bjornstad, MD;  Location: [facility] ENDOSCOPY;  Service: Cardiovascular;  Laterality: N/A;  . Atrial flutter ablation  07/09/2014  . Hernia repair    . Abdominal hernia repair  01/2007  . Low anterior bowel resection  2002  . Appendectomy  1975  . Breast biopsy Left 1998  . Breast lumpectomy Left 1998  . Colectomy    . Atrial flutter ablation N/A 07/09/2014    Procedure: ATRIAL FLUTTER ABLATION;  Surgeon: Thompson Grayer, MD;  Location: [facility] CATH LAB;  Service: Cardiovascular;   Laterality: N/A;   Family History  Problem Relation Age of Onset  . Diabetes Mother   . Cancer - Prostate Father    Social History  Substance Use Topics  . Smoking status: Former Smoker -- 1.00 packs/day for 10 years    Types: Cigarettes  . Smokeless tobacco: Never Used     Comment: "quit smoking in the 1960's"  . Alcohol Use: Yes     Comment: 07/09/2014 "might have a drink a couple times/yr"   OB History    No data available     Review of Systems  All other systems reviewed and are negative.     Allergies  Codeine and Sulfa antibiotics  Home Medications   Prior to Admission medications   Medication Sig Start Date End Date Taking? Authorizing Provider  acetaminophen (TYLENOL) 500 MG tablet Take 1,000 mg by mouth at bedtime.   Yes Historical Provider, MD  Cholecalciferol (VITAMIN D PO) Take 1 tablet by mouth daily.   Yes Historical Provider, MD  furosemide (LASIX) 40 MG tablet TAKE 1 TABLET (40 MG TOTAL) BY MOUTH 2 (TWO) TIMES DAILY. 12/21/14  Yes Carlena Bjornstad, MD  KLOR-CON M20 20 MEQ tablet TAKE 2 TABLETS BY MOUTH TWICE A DAY 11/04/14  Yes Carlena Bjornstad, MD  levothyroxine (SYNTHROID, LEVOTHROID) 50 MCG tablet Take 50 mcg by mouth  daily. 03/16/14  Yes Historical Provider, MD  metoprolol succinate (TOPROL-XL) 25 MG 24 hr tablet TAKE 0.5 TABLETS BY MOUTH DAILY. TAKE WITH OR IMMEDIATELY FOLLOWING A MEAL. 10/30/14  Yes Carlena Bjornstad, MD  Polyethyl Glycol-Propyl Glycol (SYSTANE OP) Place 1 drop into both eyes at bedtime.   Yes Historical Provider, MD   BP 136/86 mmHg  Pulse 65  Temp(Src) 98.5 F (36.9 C) (Oral)  Resp 18  SpO2 98% Physical Exam  Constitutional: She is oriented to person, place, and time. She appears well-developed and well-nourished.  HENT:  Head: Normocephalic and atraumatic.  Right nostril normal. Left nostril: No obvious anterior bleed. Clot noted in posterior segment.  Eyes: Conjunctivae and EOM are normal. Pupils are equal, round, and reactive to  light.  Neck: Normal range of motion. Neck supple.  Musculoskeletal: Normal range of motion.  Neurological: She is alert and oriented to person, place, and time.  Skin: Skin is warm and dry.  Psychiatric: She has a normal mood and affect. Her behavior is normal.  Nursing note and vitals reviewed.   ED Course  Procedures (including critical care time) Labs Review Labs Reviewed - No data to display  Imaging Review No results found. I have personally reviewed and evaluated these images and lab results as part of my medical decision-making.   EKG Interpretation None      MDM   Final diagnoses:  Epistaxis    Bleeding is minimal in the ED. Patient observed for approximate 90 minutes. No acute interventions necessary. Appointment made for ENT Friday afternoon.    Nat Christen, MD 03/20/15 [phone]

## 2015-04-05 ENCOUNTER — Encounter: Payer: Medicare Other | Admitting: Cardiology

## 2015-04-05 ENCOUNTER — Encounter: Payer: Self-pay | Admitting: Cardiology

## 2015-04-06 ENCOUNTER — Encounter: Payer: Self-pay | Admitting: Cardiology

## 2015-04-06 ENCOUNTER — Ambulatory Visit (INDEPENDENT_AMBULATORY_CARE_PROVIDER_SITE_OTHER): Payer: Medicare Other | Admitting: Cardiology

## 2015-04-06 VITALS — BP 124/70 | HR 78 | Ht 65.0 in | Wt 184.8 lb

## 2015-04-06 DIAGNOSIS — I4892 Unspecified atrial flutter: Secondary | ICD-10-CM | POA: Diagnosis not present

## 2015-04-06 DIAGNOSIS — I10 Essential (primary) hypertension: Secondary | ICD-10-CM | POA: Diagnosis not present

## 2015-04-06 NOTE — Patient Instructions (Signed)
Medication Instructions:  The current medical regimen is effective;  continue present plan and medications.  Follow-Up: Follow up as needed.  Thank you for choosing [facility]!!     

## 2015-04-06 NOTE — Progress Notes (Signed)
Cardiology Office Note   Date:  04/06/2015   ID:  Geryl, Schumann March 08, 1928, MRN [id number]  PCP:  Haywood Pao, MD  Cardiologist:   Candee Furbish, MD       History of Present Illness: [name] is a 81 y.o. female former patient of Dr. Ron Parker with atrial flutter, status post flutter ablation. Previous leg edema was improved after ablation. Support hose. Diuretics.  Flutter ablation occurred on on 07/09/14, Dr. Rayann Heman. Failed prior cardioversion on 02/27/14.       Past Medical History  Diagnosis Date  . Hypertension   . Paroxysmal atrial flutter ([place])     a. Dx 12/2013 - spont conv to NSR, placed on amiodarone. Back in flutter later 12/2013. s/p DCCV 01/2014 but did not hold - amio discontinued at that time.  . H/O Bell's palsy   . Bilateral leg edema   . PONV (postoperative nausea and vomiting)   . Family history of adverse reaction to anesthesia     "son has PONV"  . Hypothyroidism   . Chronic bronchitis ([place])     "usually get it q yr"  . Anemia   . Arthritis     "a little bit qwhere; mainly in my neck & shoulders" (07/09/2014)  . Anxiety   . Breast cancer, left breast ([place])     S/P chemo & radiation  . Colon cancer ([place])     a. s/p resection 2002.    Past Surgical History  Procedure Laterality Date  . Abdominal hysterectomy  1975  . Cataract extraction w/ intraocular lens  implant, bilateral Bilateral 2011  . Cardioversion N/A 02/27/2014    Procedure: CARDIOVERSION;  Surgeon: Carlena Bjornstad, MD;  Location: [facility] ENDOSCOPY;  Service: Cardiovascular;  Laterality: N/A;  . Atrial flutter ablation  07/09/2014  . Hernia repair    . Abdominal hernia repair  01/2007  . Low anterior bowel resection  2002  . Appendectomy  1975  . Breast biopsy Left 1998  . Breast lumpectomy Left 1998  . Colectomy    . Atrial flutter ablation N/A 07/09/2014    Procedure: ATRIAL FLUTTER ABLATION;  Surgeon: Thompson Grayer, MD;  Location: [facility] CATH LAB;  Service:  Cardiovascular;  Laterality: N/A;     Current Outpatient Prescriptions  Medication Sig Dispense Refill  . acetaminophen (TYLENOL) 500 MG tablet Take 1,000 mg by mouth at bedtime.    . Cholecalciferol (VITAMIN D PO) Take 1 tablet by mouth daily.    . furosemide (LASIX) 40 MG tablet TAKE 1 TABLET (40 MG TOTAL) BY MOUTH 2 (TWO) TIMES DAILY. 60 tablet 3  . KLOR-CON M20 20 MEQ tablet TAKE 2 TABLETS BY MOUTH TWICE A DAY 120 tablet 6  . levothyroxine (SYNTHROID, LEVOTHROID) 50 MCG tablet Take 50 mcg by mouth daily.  5  . metoprolol succinate (TOPROL-XL) 25 MG 24 hr tablet TAKE 0.5 TABLETS BY MOUTH DAILY. TAKE WITH OR IMMEDIATELY FOLLOWING A MEAL. 15 tablet 6  . Multiple Vitamins-Minerals (OCUVITE EYE HEATLH GUMMIES PO) Take 2 tablets by mouth daily.    [name] Glycol-Propyl Glycol (SYSTANE OP) Place 1 drop into both eyes at bedtime.     No current facility-administered medications for this visit.    Allergies:   Codeine and Sulfa antibiotics    Social History:  The patient  reports that she has quit smoking. Her smoking use included Cigarettes. She has a 10 pack-year smoking history. She has never used smokeless tobacco. She reports that  she drinks alcohol. She reports that she does not use illicit drugs.   Family History:  The patient's family history includes Cancer - Prostate in her father; Diabetes in her mother.    ROS:  Please see the history of present illness.   Otherwise, review of systems are positive for none.   All other systems are reviewed and negative.    PHYSICAL EXAM: VS:  BP 124/70 mmHg  Pulse 78  Ht 5\' 5"  (1.651 m)  Wt 184 lb 12.8 oz (83.825 kg)  BMI 30.75 kg/m2  SpO2 94% , BMI Body mass index is 30.75 kg/(m^2). GEN: Well nourished, well developed, in no acute distress HEENT: normal Neck: no JVD, carotid bruits, or masses Cardiac: RRR; no murmurs, rubs, or gallops Respiratory:  clear to auscultation bilaterally, normal work of breathing GI: soft, nontender,  nondistended, + BS MS: no deformity or atrophy; ankle edema (L>R), calves non-tender Skin: warm and dry, no rash Neuro:  Grossly intact Psych: euthymic mood, full affect   EKG:  Not ordered.   Recent Labs: 05/20/2014: ALT 9; Pro B Natriuretic peptide (BNP) 115.0* 04/02/2015: Creat 1.1*; Hgb 12.9   Lipid Panel No results found for: CHOL, TRIG, HDL, CHOLHDL, VLDL, LDLCALC, LDLDIRECT    Wt Readings from Last 3 Encounters:  04/06/15 184 lb 12.8 oz (83.825 kg)  10/30/14 179 lb 3.2 oz (81.285 kg)  08/28/14 183 lb (83.008 kg)      Other studies Reviewed: Additional studies/ records that were reviewed today include: labs, prior note. Review of the above records demonstrates: as above   ASSESSMENT AND PLAN:  Atrial flutter  - resolved post ablation 06/2014 - doing well; follows with PCP twice yearly; can follow-up with cardiology prn  Lower extremity edema/chronic venous insufficiency - L > R noted in prior note 07/16; normal LE duplex in 05/2014 reassuring  - Support hose  - Creatinine 1.1; 1.29   Diarrhea  - occuring a few times per week for past few weeks; no other GI symptoms and tolerating po without weight loss. - K less likely to cause diarrhea and she has been on this med for awhile.  Stay on K with Lasix.   - feeling of incomplete emptying may be IBS but with Colon Ca hx advised she follow-up with PCP  Current medicines are reviewed at length with the patient today.  The patient does not have concerns regarding medicines.  The following changes have been made:  no change  Labs/ tests ordered today include: none  No orders of the defined types were placed in this encounter.     Disposition:   FU with [place] in 1 prn  Signed, Candee Furbish, MD  04/06/2015 10:42 AM    [facility] [place], [place],   [address] Phone: [phone]; Fax: [phone]

## 2015-04-28 ENCOUNTER — Other Ambulatory Visit: Payer: Self-pay | Admitting: *Deleted

## 2015-04-28 MED ORDER — POTASSIUM CHLORIDE CRYS ER 20 MEQ PO TBCR: 40.0000 meq | EXTENDED_RELEASE_TABLET | Freq: Two times a day (BID) | ORAL | Status: DC

## 2015-05-15 ENCOUNTER — Other Ambulatory Visit: Payer: Self-pay | Admitting: Cardiology

## 2015-06-10 ENCOUNTER — Other Ambulatory Visit: Payer: Self-pay | Admitting: *Deleted

## 2015-06-10 MED ORDER — FUROSEMIDE 40 MG PO TABS: ORAL_TABLET | ORAL | Status: DC

## 2015-08-25 ENCOUNTER — Other Ambulatory Visit: Payer: Self-pay | Admitting: *Deleted

## 2015-08-25 MED ORDER — METOPROLOL SUCCINATE ER 25 MG PO TB24: ORAL_TABLET | ORAL | Status: DC

## 2015-09-19 ENCOUNTER — Emergency Department (HOSPITAL_COMMUNITY)
Admission: EM | Admit: 2015-09-19 | Discharge: 2015-09-19 | Disposition: A | Discharge status: Home / Self Care | Payer: Medicare Other | Attending: Emergency Medicine | Admitting: Emergency Medicine

## 2015-09-19 ENCOUNTER — Emergency Department (HOSPITAL_COMMUNITY): Payer: Medicare Other

## 2015-09-19 ENCOUNTER — Encounter (HOSPITAL_COMMUNITY): Payer: Self-pay

## 2015-09-19 DIAGNOSIS — Z87891 Personal history of nicotine dependence: Secondary | ICD-10-CM | POA: Insufficient documentation

## 2015-09-19 DIAGNOSIS — Z79899 Other long term (current) drug therapy: Secondary | ICD-10-CM | POA: Insufficient documentation

## 2015-09-19 DIAGNOSIS — I10 Essential (primary) hypertension: Secondary | ICD-10-CM | POA: Insufficient documentation

## 2015-09-19 DIAGNOSIS — W01198A Fall on same level from slipping, tripping and stumbling with subsequent striking against other object, initial encounter: Secondary | ICD-10-CM | POA: Insufficient documentation

## 2015-09-19 DIAGNOSIS — M25551 Pain in right hip: Secondary | ICD-10-CM | POA: Diagnosis present

## 2015-09-19 DIAGNOSIS — Z853 Personal history of malignant neoplasm of breast: Secondary | ICD-10-CM | POA: Insufficient documentation

## 2015-09-19 DIAGNOSIS — M25552 Pain in left hip: Secondary | ICD-10-CM | POA: Insufficient documentation

## 2015-09-19 DIAGNOSIS — Z79891 Long term (current) use of opiate analgesic: Secondary | ICD-10-CM | POA: Insufficient documentation

## 2015-09-19 DIAGNOSIS — Z85038 Personal history of other malignant neoplasm of large intestine: Secondary | ICD-10-CM | POA: Diagnosis not present

## 2015-09-19 DIAGNOSIS — Y999 Unspecified external cause status: Secondary | ICD-10-CM | POA: Insufficient documentation

## 2015-09-19 DIAGNOSIS — M19019 Primary osteoarthritis, unspecified shoulder: Secondary | ICD-10-CM | POA: Diagnosis not present

## 2015-09-19 DIAGNOSIS — Y939 Activity, unspecified: Secondary | ICD-10-CM | POA: Diagnosis not present

## 2015-09-19 DIAGNOSIS — J42 Unspecified chronic bronchitis: Secondary | ICD-10-CM | POA: Insufficient documentation

## 2015-09-19 DIAGNOSIS — E039 Hypothyroidism, unspecified: Secondary | ICD-10-CM | POA: Insufficient documentation

## 2015-09-19 DIAGNOSIS — I4891 Unspecified atrial fibrillation: Secondary | ICD-10-CM | POA: Insufficient documentation

## 2015-09-19 DIAGNOSIS — Y929 Unspecified place or not applicable: Secondary | ICD-10-CM | POA: Insufficient documentation

## 2015-09-19 MED ORDER — HYDROCODONE-ACETAMINOPHEN 5-325 MG PO TABS
1.0000 | ORAL_TABLET | Freq: Once | ORAL | Status: AC
  Administered 2015-09-19: 1 via ORAL
  Filled 2015-09-19: qty 1

## 2015-09-19 MED ORDER — HYDROCODONE-ACETAMINOPHEN 5-325 MG PO TABS: 1.0000 | ORAL_TABLET | Freq: Four times a day (QID) | ORAL | Status: DC | PRN

## 2015-09-19 NOTE — ED Provider Notes (Signed)
CSN: [id number]     Arrival date & time 09/19/15  [id number] History   First MD Initiated Contact with Patient 09/19/15 0755     Chief Complaint  Patient presents with  . Hip Pain     (Consider location/radiation/quality/duration/timing/severity/associated sxs/prior Treatment) HPI Comments: 82 year old female with history of atrial fibrillation not on anticoagulation, hypertension presents for hip pain. Yesterday the patient was getting out of the shower and stretched her left leg. She was starting to fall but caught herself. She was able to sit herself down on the side of the tub over the runner for the door. Patient tried Tylenol and ice for it at home and has continued to have pain in the left hip. She is able to walk but says that she has significant pain with bearing weight. Denies other injury. Did not hit her head. She has felt fine otherwise since the slip.  Patient is a 82 y.o. female presenting with hip pain.  Hip Pain Pertinent negatives include no chest pain, no abdominal pain, no headaches and no shortness of breath.    Past Medical History  Diagnosis Date  . Hypertension   . Paroxysmal atrial flutter ([place])     a. Dx 12/2013 - spont conv to NSR, placed on amiodarone. Back in flutter later 12/2013. s/p DCCV 01/2014 but did not hold - amio discontinued at that time.  . H/O Bell's palsy   . Bilateral leg edema   . PONV (postoperative nausea and vomiting)   . Family history of adverse reaction to anesthesia     "son has PONV"  . Hypothyroidism   . Chronic bronchitis ([place])     "usually get it q yr"  . Anemia   . Arthritis     "a little bit qwhere; mainly in my neck & shoulders" (07/09/2014)  . Anxiety   . Breast cancer, left breast ([place])     S/P chemo & radiation  . Colon cancer ([place])     a. s/p resection 2002.   Past Surgical History  Procedure Laterality Date  . Abdominal hysterectomy  1975  . Cataract extraction w/ intraocular lens  implant, bilateral Bilateral 2011  .  Cardioversion N/A 02/27/2014    Procedure: CARDIOVERSION;  Surgeon: Carlena Bjornstad, MD;  Location: [facility] ENDOSCOPY;  Service: Cardiovascular;  Laterality: N/A;  . Atrial flutter ablation  07/09/2014  . Hernia repair    . Abdominal hernia repair  01/2007  . Low anterior bowel resection  2002  . Appendectomy  1975  . Breast biopsy Left 1998  . Breast lumpectomy Left 1998  . Colectomy    . Atrial flutter ablation N/A 07/09/2014    Procedure: ATRIAL FLUTTER ABLATION;  Surgeon: Thompson Grayer, MD;  Location: [facility] CATH LAB;  Service: Cardiovascular;  Laterality: N/A;   Family History  Problem Relation Age of Onset  . Diabetes Mother   . Cancer - Prostate Father    Social History  Substance Use Topics  . Smoking status: Former Smoker -- 1.00 packs/day for 10 years    Types: Cigarettes  . Smokeless tobacco: Never Used     Comment: "quit smoking in the 1960's"  . Alcohol Use: Yes     Comment: 07/09/2014 "might have a drink a couple times/yr"   OB History    No data available     Review of Systems  Constitutional: Negative for chills, fatigue and unexpected weight change.  HENT: Negative for congestion, nosebleeds and postnasal drip.   Eyes: Negative for  visual disturbance.  Respiratory: Negative for cough, chest tightness and shortness of breath.   Cardiovascular: Negative for chest pain, palpitations and leg swelling.  Gastrointestinal: Negative for nausea, vomiting, abdominal pain and diarrhea.  Genitourinary: Negative for dysuria, urgency, frequency and hematuria.  Musculoskeletal: Positive for arthralgias (left hip pain). Negative for myalgias and back pain.  Skin: Negative for pallor, rash and wound.  Neurological: Negative for dizziness, seizures, weakness, light-headedness, numbness and headaches.  Hematological: Does not bruise/bleed easily.      Allergies  Codeine and Sulfa antibiotics  Home Medications   Prior to Admission medications   Medication Sig Start Date End Date  Taking? Authorizing Provider  acetaminophen (TYLENOL) 500 MG tablet Take 1,000 mg by mouth 2 (two) times daily. Mainly just two at night.   Yes Historical Provider, MD  Cholecalciferol (VITAMIN D PO) Take 1 tablet by mouth daily.   Yes Historical Provider, MD  furosemide (LASIX) 40 MG tablet TAKE 1 TABLET (40 MG TOTAL) BY MOUTH 2 (TWO) TIMES DAILY. 06/10/15  Yes Jerline Pain, MD  levothyroxine (SYNTHROID, LEVOTHROID) 50 MCG tablet Take 50 mcg by mouth daily. 03/16/14  Yes Historical Provider, MD  metoprolol succinate (TOPROL-XL) 25 MG 24 hr tablet TAKE 1/2 TABLET BY MOUTH EVERY DAY (TAKE WITH A MEAL OR IMMEDIATELY FOLLOWING A MEAL) 08/25/15  Yes Jerline Pain, MD  Multiple Vitamins-Minerals (OCUVITE EYE HEATLH GUMMIES PO) Take 2 tablets by mouth daily.   Yes Historical Provider, MD  Polyethyl Glycol-Propyl Glycol (SYSTANE OP) Place 1 drop into both eyes at bedtime.   Yes Historical Provider, MD  potassium chloride SA (KLOR-CON M20) 20 MEQ tablet Take 2 tablets (40 mEq total) by mouth 2 (two) times daily. 04/28/15  Yes Jerline Pain, MD  HYDROcodone-acetaminophen (NORCO) 5-325 MG tablet Take 1 tablet by mouth every 6 (six) hours as needed for moderate pain or severe pain. 09/19/15   Harvel Quale, MD   BP 137/62 mmHg  Pulse 75  Temp(Src) 98.3 F (36.8 C) (Oral)  Resp 18  SpO2 97% Physical Exam  Constitutional: She is oriented to person, place, and time. She appears well-developed and well-nourished. No distress.  HENT:  Head: Normocephalic and atraumatic.  Right Ear: External ear normal.  Left Ear: External ear normal.  Nose: Nose normal.  Mouth/Throat: Oropharynx is clear and moist. No oropharyngeal exudate.  Eyes: EOM are normal. Pupils are equal, round, and reactive to light.  Neck: Normal range of motion. Neck supple.  Cardiovascular: Normal rate, regular rhythm, normal heart sounds and intact distal pulses.   No murmur heard. Pulmonary/Chest: Effort normal. No respiratory distress.  She has no wheezes. She has no rales.  Abdominal: Soft. She exhibits no distension. There is no tenderness.  Musculoskeletal: Normal range of motion. She exhibits no edema or tenderness.       Left hip: She exhibits bony tenderness. She exhibits normal range of motion, normal strength, no tenderness, no swelling, no crepitus, no deformity and no laceration.       Left knee: Normal.       Lumbar back: Normal.       Left upper leg: Normal.       Legs: Neurological: She is alert and oriented to person, place, and time.  Patient able to ambulate without difficulty. Does report pain with weightbearing of the left leg.  Skin: Skin is warm and dry. No rash noted. She is not diaphoretic.  Vitals reviewed.   ED Course  Procedures (including critical care  time) Labs Review Labs Reviewed - No data to display  Imaging Review Ct Hip Left Wo Contrast  09/19/2015  CLINICAL DATA:  Fall, left hip pain EXAM: CT OF THE LEFT HIP WITHOUT CONTRAST TECHNIQUE: Multidetector CT imaging of the left hip was performed according to the standard protocol. Multiplanar CT image reconstructions were also generated. COMPARISON:  Left hip radiographs dated 09/19/2015 FINDINGS: No fracture or dislocation is seen. Left hip joint space is preserved. Visualized bony pelvis appears intact. No evidence of intramuscular hematoma. Visualized left pelvis is notable for colonic diverticulosis, surgical clips, and a small fat containing left femoral hernia. IMPRESSION: No fracture or dislocation is seen. Electronically Signed   By: Julian Hy M.D.   On: 09/19/2015 09:58   Dg Hip Unilat With Pelvis 2-3 Views Left  09/19/2015  CLINICAL DATA:  Fall with left hip pain EXAM: DG HIP (WITH OR WITHOUT PELVIS) 2-3V LEFT COMPARISON:  None. FINDINGS: Hernia repair mesh overlies the bilateral lower abdomen. Surgical clips and sutures overlie the sacrum. No fracture, dislocation, suspicious focal osseous lesion or appreciable left hip  arthropathy. Degenerative changes in the visualized lower lumbar spine. IMPRESSION: No fracture or malalignment. Electronically Signed   By: Ilona Sorrel M.D.   On: 09/19/2015 08:22   I have personally reviewed and evaluated these images and lab results as part of my medical decision-making.   EKG Interpretation None      MDM  Patient was seen and evaluated in stable condition. Patient neurovascularly intact. X-ray and CT negative for acute process. Patient able to walk and ambulate. She has a walker at home that she can use. She was given a dose of Norco which she tolerated well. She was discharged home with a short prescription of Norco and instruction of follow-up with her primary care physician. Patient and children at bedside expressed understanding and agreement with plan of care. Final diagnoses:  Hip pain, left    1. Left hip pain    Harvel Quale, MD 09/19/15 1048

## 2015-09-19 NOTE — Discharge Instructions (Signed)
You were seen and evaluated today for left hip pain. This is likely muscle strain or bruising or strain on a nerve. There does not appear to be any acute fracture. Follow-up with your primary care physician if the pain persists. Use the pain medication as prescribed. Do not take Tylenol and this pain medicine at the same time.  Hip Pain Your hip is the joint between your upper legs and your lower pelvis. The bones, cartilage, tendons, and muscles of your hip joint perform a lot of work each day supporting your body weight and allowing you to move around. Hip pain can range from a minor ache to severe pain in one or both of your hips. Pain may be felt on the inside of the hip joint near the groin, or the outside near the buttocks and upper thigh. You may have swelling or stiffness as well.  HOME CARE INSTRUCTIONS   Take medicines only as directed by your health care provider.  Apply ice to the injured area:  Put ice in a plastic bag.  Place a towel between your skin and the bag.  Leave the ice on for 15-20 minutes at a time, 3-4 times a day.  Keep your leg raised (elevated) when possible to lessen swelling.  Avoid activities that cause pain.  Follow specific exercises as directed by your health care provider.  Sleep with a pillow between your legs on your most comfortable side.  Record how often you have hip pain, the location of the pain, and what it feels like. SEEK MEDICAL CARE IF:   You are unable to put weight on your leg.  Your hip is red or swollen or very tender to touch.  Your pain or swelling continues or worsens after 1 week.  You have increasing difficulty walking.  You have a fever. SEEK IMMEDIATE MEDICAL CARE IF:   You have fallen.  You have a sudden increase in pain and swelling in your hip. MAKE SURE YOU:   Understand these instructions.  Will watch your condition.  Will get help right away if you are not doing well or get worse.   This information is  not intended to replace advice given to you by your health care provider. Make sure you discuss any questions you have with your health care provider.   Document Released: 10/05/2009 Document Revised: 05/08/2014 Document Reviewed: 12/12/2012 Elsevier Interactive Patient Education [employer].

## 2015-09-19 NOTE — ED Notes (Signed)
Pt getting out of shower.  Pt slipped and caught fall.  Hip hit track of door.  Pt using ice.  Painful to touch.  Difficulty putting weight.  Left hip.

## 2015-09-19 NOTE — ED Notes (Signed)
She is back from CT.  She tells me she is more comfortable and is visiting pleasantly with her son.

## 2016-02-26 ENCOUNTER — Other Ambulatory Visit: Payer: Self-pay | Admitting: Cardiology

## 2016-02-28 ENCOUNTER — Other Ambulatory Visit: Payer: Self-pay | Admitting: Cardiology

## 2016-02-28 MED ORDER — FUROSEMIDE 40 MG PO TABS: ORAL_TABLET | ORAL | 1 refills | Status: DC

## 2016-03-30 ENCOUNTER — Other Ambulatory Visit: Payer: Self-pay | Admitting: Cardiology

## 2016-03-30 MED ORDER — FUROSEMIDE 40 MG PO TABS: ORAL_TABLET | ORAL | 0 refills | Status: DC

## 2016-04-26 ENCOUNTER — Other Ambulatory Visit: Payer: Self-pay

## 2016-04-26 MED ORDER — FUROSEMIDE 40 MG PO TABS: ORAL_TABLET | ORAL | 0 refills | Status: DC

## 2016-05-11 ENCOUNTER — Other Ambulatory Visit: Payer: Self-pay | Admitting: Cardiology

## 2016-05-17 IMAGING — CR DG CHEST 1V PORT
1 series · 1 of 1 positions shown · non-contrast
Comparison: 02/28/2007

CLINICAL DATA: Upper chest tightness. History of breast and colon
cancer.

EXAM:
PORTABLE CHEST - 1 VIEW

[AP]
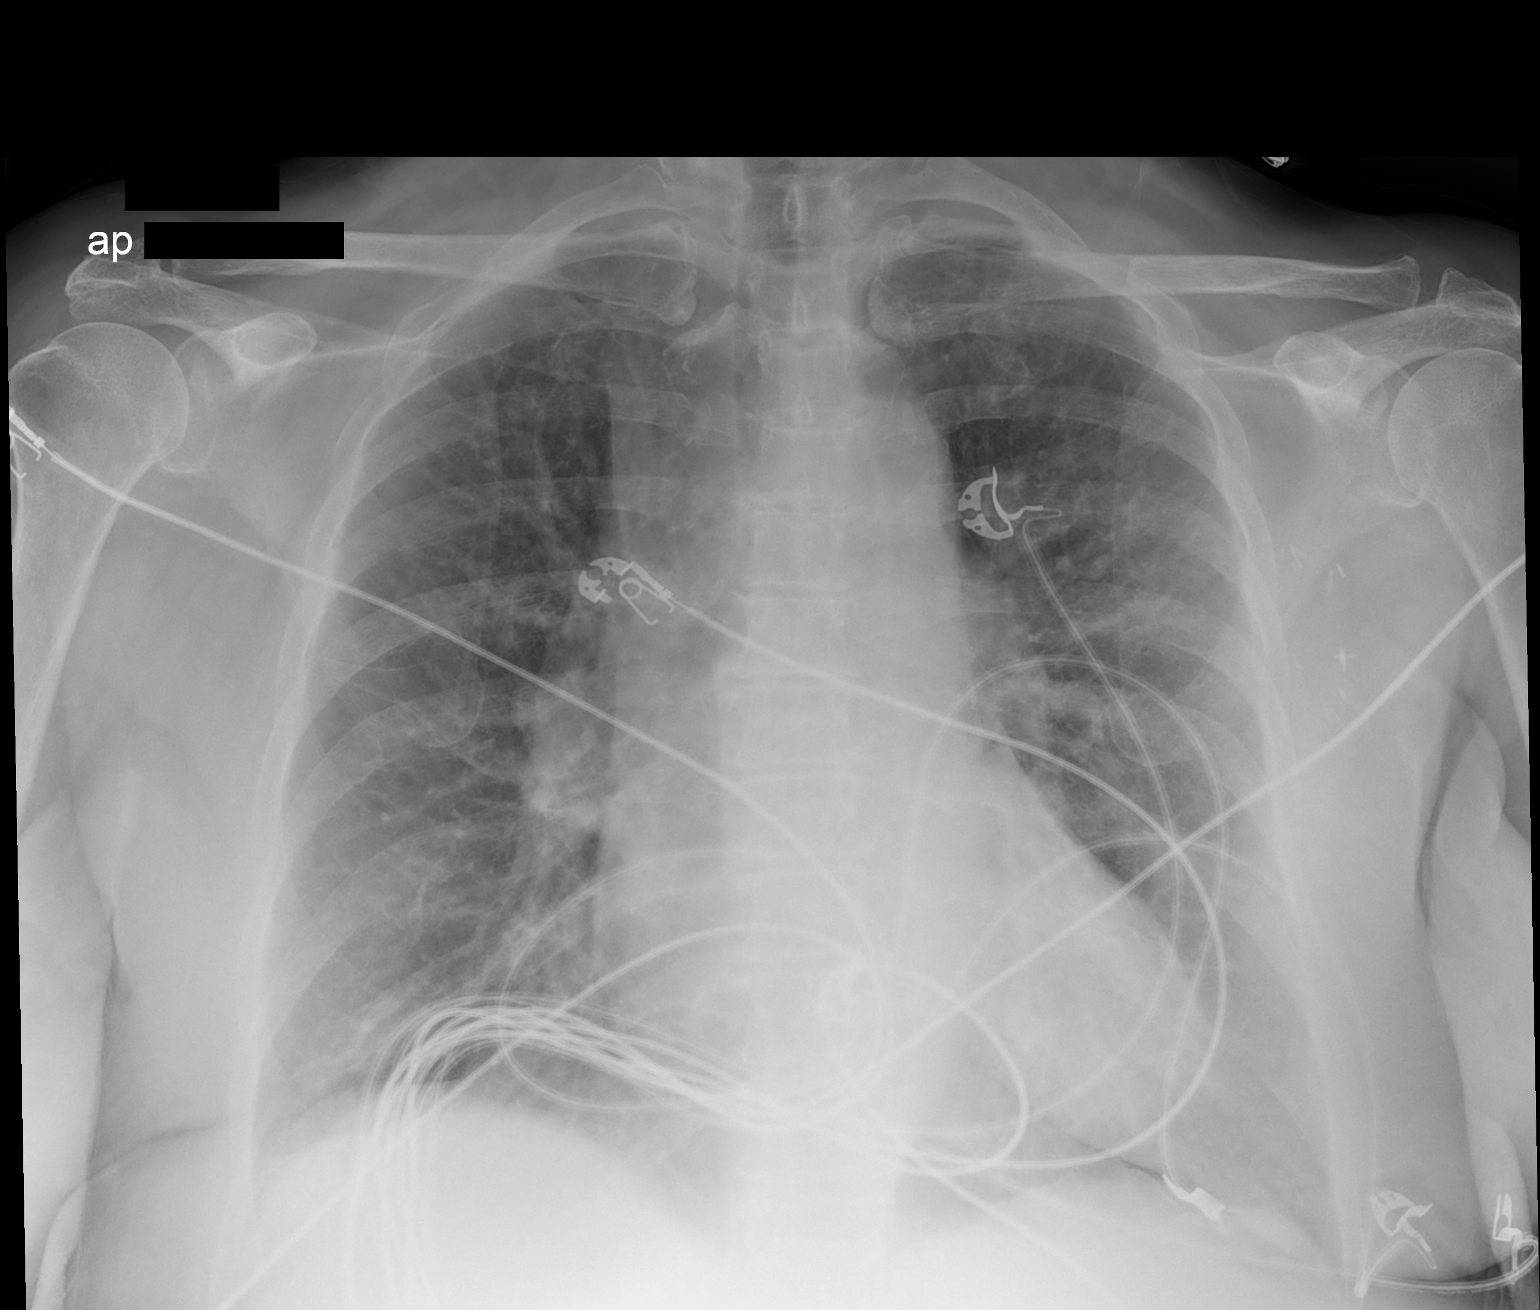

[1 of 1 positions shown; findings below may reference images not displayed]

FINDINGS: Mild cardiac enlargement and pulmonary vascular congestion. No edema
or consolidation in the lungs. No blunting of costophrenic angles.
Surgical clips in the left axilla. No pneumothorax.
IMPRESSION: Cardiac enlargement with mild pulmonary vascular congestion. No
consolidation or edema.

## 2016-06-11 ENCOUNTER — Other Ambulatory Visit: Payer: Self-pay | Admitting: Cardiology

## 2016-08-30 IMAGING — CT CT HEAD W/O CM
2 series · 17 of 30 positions shown, 20 images · non-contrast
Comparison: MRI brain dated 10/01/2013

CLINICAL DATA: Fall, on blood thinner Eliquis

EXAM:
CT HEAD WITHOUT CONTRAST
TECHNIQUE: Contiguous axial images were obtained from the base of the skull
through the vertex without intravenous contrast.

[Series 2: head w/o · axial · non-contrast · 0.44mm/px · z∈[+1058,+1178]mm · 9 of 31 slices shown, 12 images]
[im 4/31  brain]
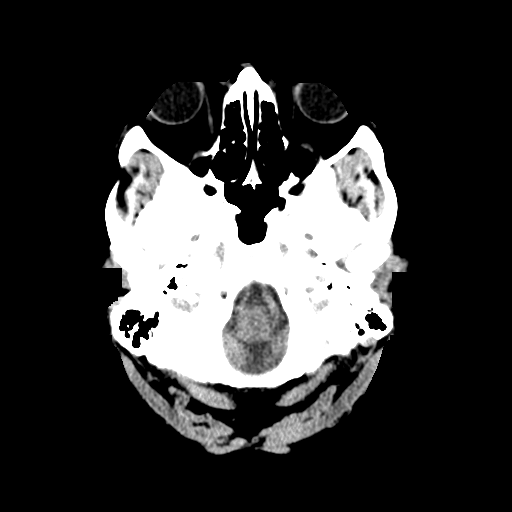
[im 4/31  bone]
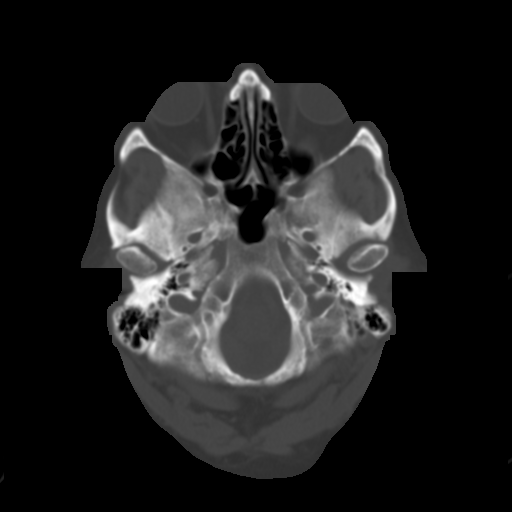
[im 7/31  brain]
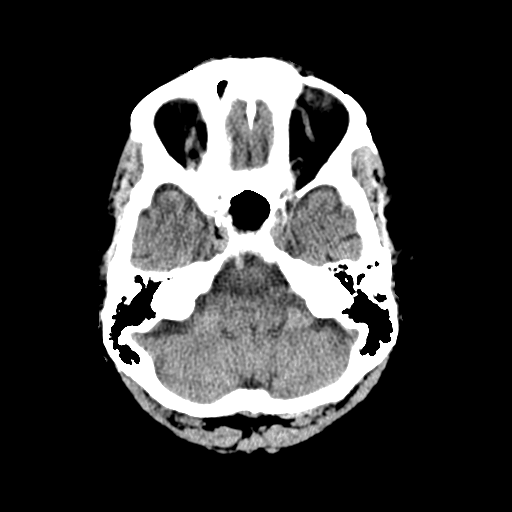
[im 10/31  brain]
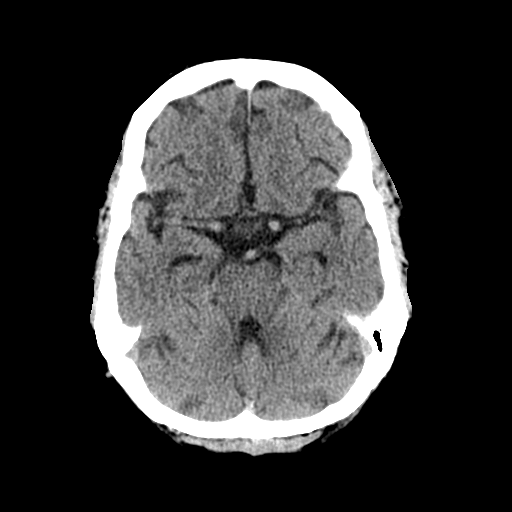
[im 13/31  brain]
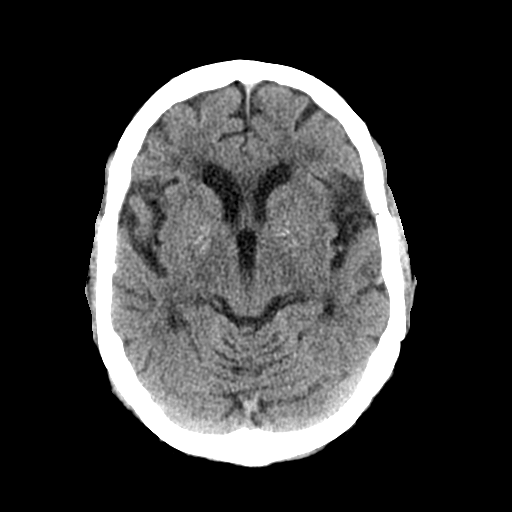
[im 16/31  brain]
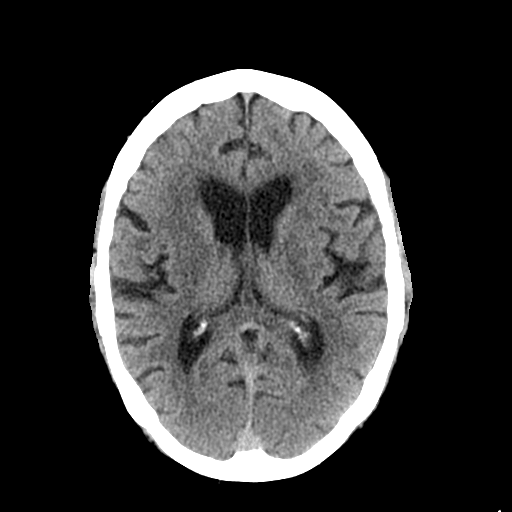
[im 16/31  bone]
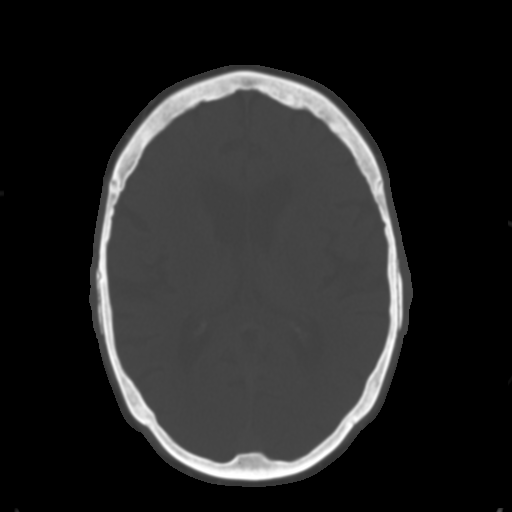
[im 19/31  brain]
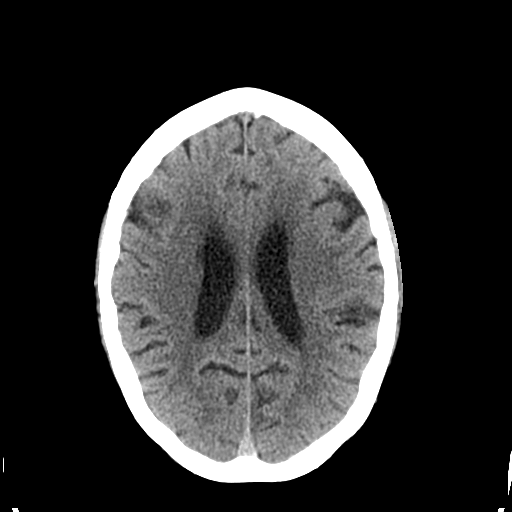
[im 22/31  brain]
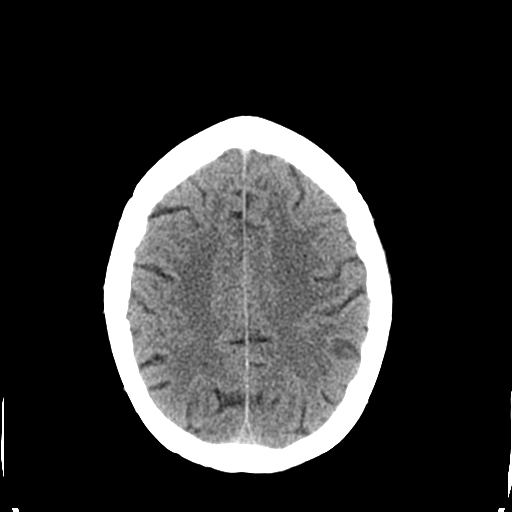
[im 25/31  brain]
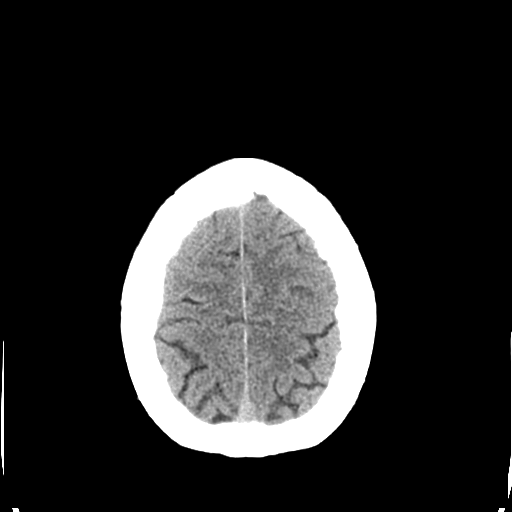
[im 28/31  brain]
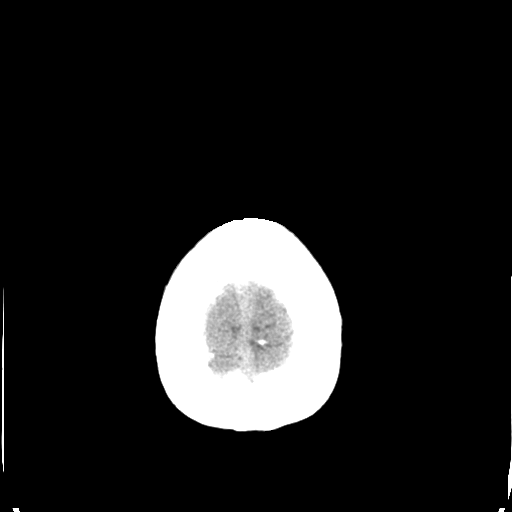
[im 28/31  bone]
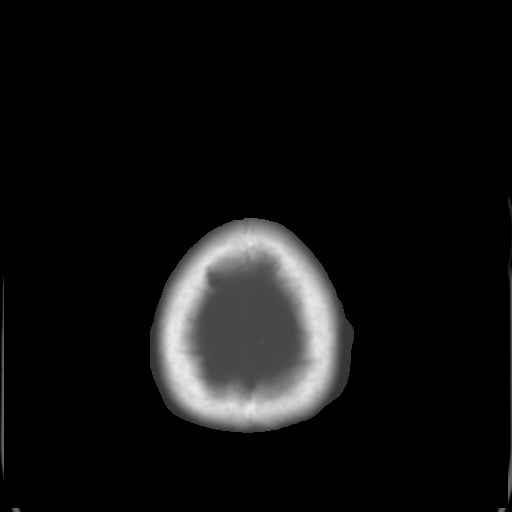

[Series 3: bone windows · axial · 0.44mm/px · z∈[+1058,+1175]mm · 8 of 51 slices shown]
[im 6/51  bone]
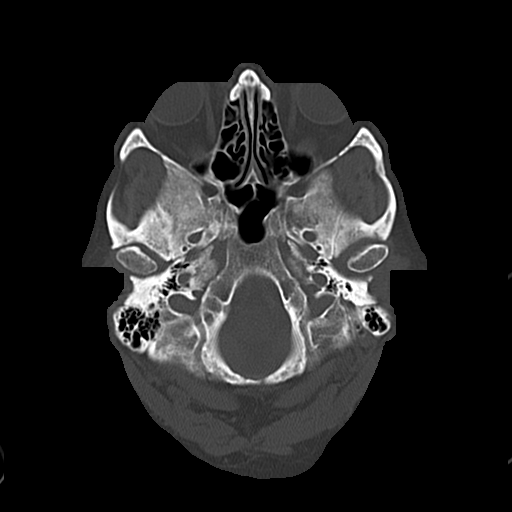
[im 12/51  bone]
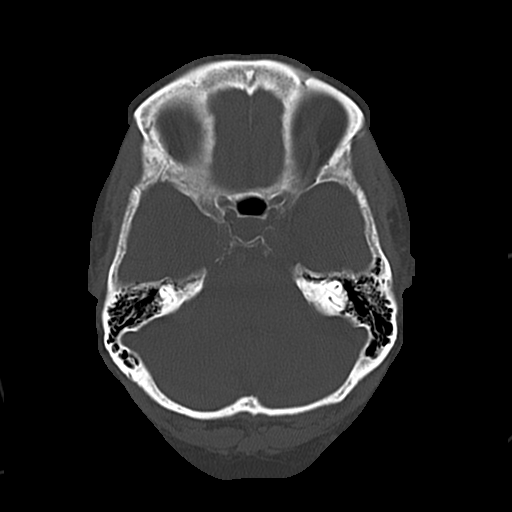
[im 17/51  bone]
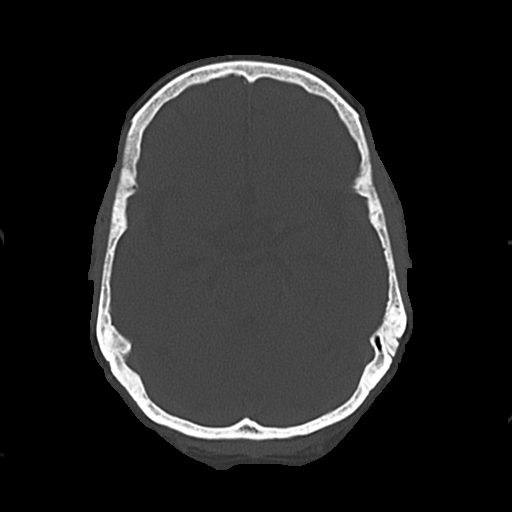
[im 23/51  bone]
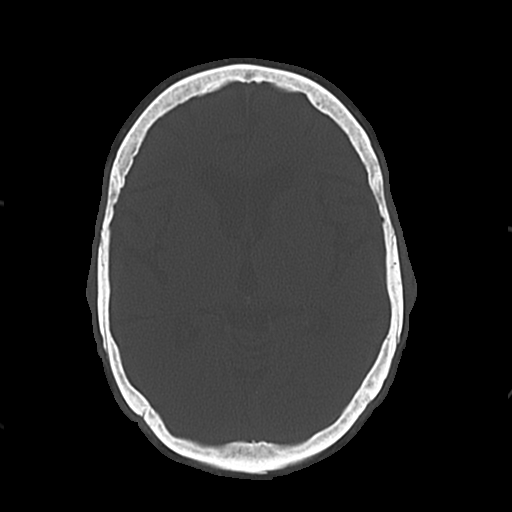
[im 28/51  bone]
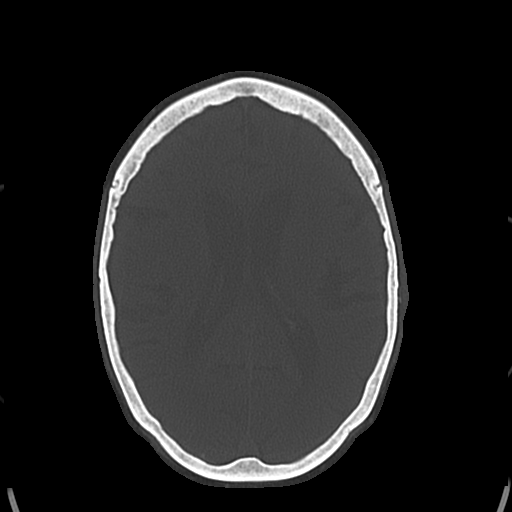
[im 34/51  bone]
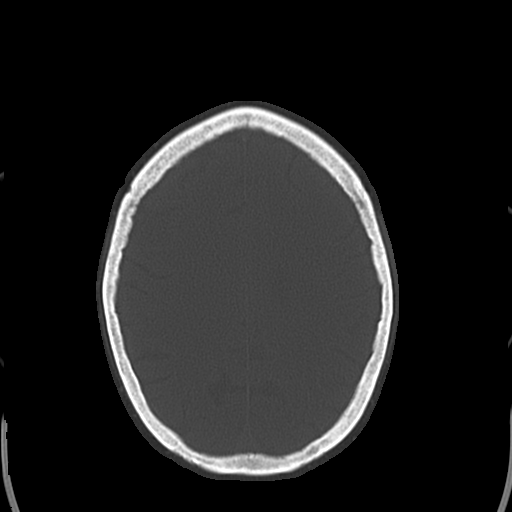
[im 39/51  bone]
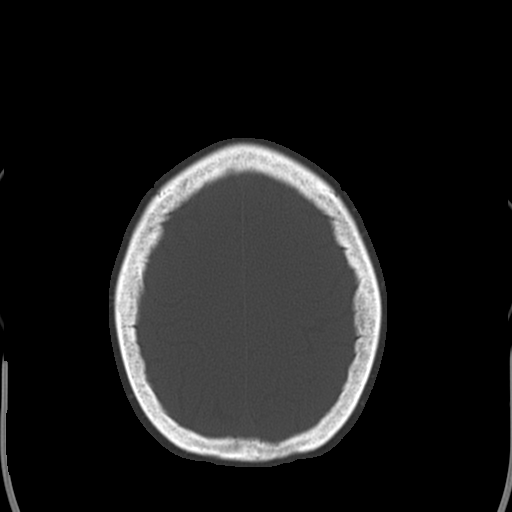
[im 45/51  bone]
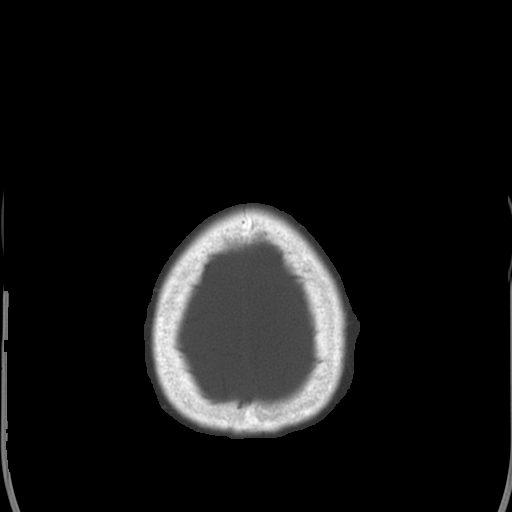

[17 of 30 positions shown; findings below may reference images not displayed]

FINDINGS: No evidence of parenchymal hemorrhage or extra-axial fluid
collection. No mass lesion, mass effect, or midline shift.

No CT evidence of acute infarction.

Subcortical white matter and periventricular small vessel ischemic
changes. Mild intracranial atherosclerosis.

Global cortical atrophy.  No ventriculomegaly.

The visualized paranasal sinuses are essentially clear. The mastoid
air cells are unopacified.

No evidence of calvarial fracture.
IMPRESSION: No evidence of acute intracranial abnormality.

Mild small vessel ischemic changes.

## 2016-10-02 ENCOUNTER — Telehealth: Payer: Self-pay | Admitting: Cardiology

## 2016-10-02 NOTE — Telephone Encounter (Signed)
Pt was last seen 12/16 by Dr Marlou Porch.  She was taking Metoprolol 25 mg 1/2 tablet daily then.  Advised since we had not seen her in so long another MD could have changed the dosage but that's what she was instructed to take when Dr Marlou Porch saw her last.  Daughter states understanding and will f/u with pt's PCP as well.

## 2016-10-02 NOTE — Telephone Encounter (Signed)
New message      Pt c/o medication issue:  1. Name of Medication: metoprolol  2. How are you currently taking this medication (dosage and times per day)?   3. Are you having a reaction (difficulty breathing--STAT)?   4. What is your medication issue?  Pt was released by dr Marlou Porch to see her pcp.  However, there is confusion on the dosage of her metoprolol by her pharmacist and pcp.  Daughter is calling to get the dosage and instructions of metoprolol when pt last saw dr Marlou Porch.  Please call

## 2016-12-01 ENCOUNTER — Encounter: Payer: Self-pay | Admitting: Emergency Medicine

## 2016-12-01 ENCOUNTER — Ambulatory Visit (INDEPENDENT_AMBULATORY_CARE_PROVIDER_SITE_OTHER): Payer: Medicare Other | Admitting: Emergency Medicine

## 2016-12-01 VITALS — BP 118/71 | HR 83 | Temp 98.1°F | Resp 16 | Ht 65.0 in | Wt 187.0 lb

## 2016-12-01 DIAGNOSIS — L239 Allergic contact dermatitis, unspecified cause: Secondary | ICD-10-CM

## 2016-12-01 DIAGNOSIS — T07XXXA Unspecified multiple injuries, initial encounter: Secondary | ICD-10-CM | POA: Diagnosis not present

## 2016-12-01 DIAGNOSIS — W57XXXA Bitten or stung by nonvenomous insect and other nonvenomous arthropods, initial encounter: Secondary | ICD-10-CM | POA: Diagnosis not present

## 2016-12-01 DIAGNOSIS — B880 Other acariasis: Secondary | ICD-10-CM | POA: Insufficient documentation

## 2016-12-01 DIAGNOSIS — L089 Local infection of the skin and subcutaneous tissue, unspecified: Secondary | ICD-10-CM | POA: Diagnosis not present

## 2016-12-01 MED ORDER — CEFADROXIL 500 MG PO CAPS: 500.0000 mg | ORAL_CAPSULE | Freq: Two times a day (BID) | ORAL | 0 refills | Status: AC

## 2016-12-01 MED ORDER — PREDNISONE 20 MG PO TABS: 40.0000 mg | ORAL_TABLET | Freq: Every day | ORAL | 0 refills | Status: AC

## 2016-12-01 MED ORDER — METHYLPREDNISOLONE SODIUM SUCC 125 MG IJ SOLR
125.0000 mg | Freq: Once | INTRAMUSCULAR | Status: AC
  Administered 2016-12-01: 125 mg via INTRAMUSCULAR

## 2016-12-01 NOTE — Patient Instructions (Addendum)
   IF you received an x-ray today, you will receive an invoice from [facility]. Please contact [facility] at [phone] with questions or concerns regarding your invoice.   IF you received labwork today, you will receive an invoice from [facility]. Please contact [facility] at [phone] with questions or concerns regarding your invoice.   Our billing staff will not be able to assist you with questions regarding bills from these companies.  You will be contacted with the lab results as soon as they are available. The fastest way to get your results is to activate your My Chart account. Instructions are located on the last page of this paperwork. If you have not heard from us regarding the results in 2 weeks, please contact this office.     Rash A rash is a change in the color of the skin. A rash can also change the way your skin feels. There are many different conditions and factors that can cause a rash. Follow these instructions at home: Pay attention to any changes in your symptoms. Follow these instructions to help with your condition: Medicine Take or apply over-the-counter and prescription medicines only as told by your doctor. These may include:  Corticosteroid cream.  Anti-itch lotions.  Oral antihistamines.  Skin Care  Put cool compresses on the affected areas.  Try taking a bath with: ? Epsom salts. Follow the instructions on the packaging. You can get these at your local pharmacy or grocery store. ? Baking soda. Pour a small amount into the bath as told by your doctor. ? Colloidal oatmeal. Follow the instructions on the packaging. You can get this at your local pharmacy or grocery store.  Try putting baking soda paste onto your skin. Stir water into baking soda until it gets like a paste.  Do not scratch or rub your skin.  Avoid covering the rash. Make sure the rash is exposed to air as much as possible. General instructions  Avoid hot  showers or baths, which can make itching worse. A cold shower may help.  Avoid scented soaps, detergents, and perfumes. Use gentle soaps, detergents, perfumes, and other cosmetic products.  Avoid anything that causes your rash. Keep a journal to help track what causes your rash. Write down: ? What you eat. ? What cosmetic products you use. ? What you drink. ? What you wear. This includes jewelry.  Keep all follow-up visits as told by your doctor. This is important. Contact a doctor if:  You sweat at night.  You lose weight.  You pee (urinate) more than normal.  You feel weak.  You throw up (vomit).  Your skin or the whites of your eyes look yellow (jaundice).  Your skin: ? Tingles. ? Is numb.  Your rash: ? Does not go away after a few days. ? Gets worse.  You are: ? More thirsty than normal. ? More tired than normal.  You have: ? New symptoms. ? Pain in your belly (abdomen). ? A fever. ? Watery poop (diarrhea). Get help right away if:  Your rash covers all or most of your body. The rash may or may not be painful.  You have blisters that: ? Are on top of the rash. ? Grow larger. ? Grow together. ? Are painful. ? Are inside your nose or mouth.  You have a rash that: ? Looks like purple pinprick-sized spots all over your body. ? Has a "bull's eye" or looks like a target. ? Is red and painful, causes   your skin to peel, and is not from being in the sun too long. This information is not intended to replace advice given to you by your health care provider. Make sure you discuss any questions you have with your health care provider. Document Released: 10/04/2007 Document Revised: 09/23/2015 Document Reviewed: 09/02/2014 Elsevier Interactive Patient Education  2018 Elsevier Inc[employer]  

## 2016-12-01 NOTE — Progress Notes (Signed)
[name] 83 y.o.   Chief Complaint  Patient presents with  . Establish Care  . Rash    on the body x  2 weeks with itching    HISTORY OF PRESENT ILLNESS: This is a 83 y.o. female complaining of itchy rash to both legs that started 2 weeks ago; daughter believes these are chiggers bites; rash is now spreading to torso. No respiratory symptoms or associated symptoms.  HPI   Prior to Admission medications   Medication Sig Start Date End Date Taking? Authorizing Provider  acetaminophen (TYLENOL) 500 MG tablet Take 1,000 mg by mouth 2 (two) times daily. Mainly just two at night.   Yes [provider]  Cholecalciferol (VITAMIN D PO) Take 1 tablet by mouth daily.   Yes [provider]  furosemide (LASIX) 40 MG tablet TAKE 1 TABLET (40 MG TOTAL) BY MOUTH 2 (TWO) TIMES DAILY. 04/26/16  Yes Jerline Pain, MD  levothyroxine (SYNTHROID, LEVOTHROID) 50 MCG tablet Take 50 mcg by mouth daily. 03/16/14  Yes [provider]  metoprolol succinate (TOPROL-XL) 25 MG 24 hr tablet TAKE 1/2 TABLET BY MOUTH EVERY DAY (TAKE WITH A MEAL OR IMMEDIATELY FOLLOWING A MEAL) 08/25/15  Yes Jerline Pain, MD  Multiple Vitamins-Minerals (OCUVITE EYE HEATLH GUMMIES PO) Take 2 tablets by mouth daily.   Yes [provider]  Polyethyl Glycol-Propyl Glycol (SYSTANE OP) Place 1 drop into both eyes at bedtime.   Yes [provider]  potassium chloride SA (KLOR-CON M20) 20 MEQ tablet Take 2 tablets (40 mEq total) by mouth 2 (two) times daily. 04/28/15  Yes Jerline Pain, MD  HYDROcodone-acetaminophen (NORCO) 5-325 MG tablet Take 1 tablet by mouth every 6 (six) hours as needed for moderate pain or severe pain. Patient not taking: Reported on 12/01/2016 09/19/15   Harvel Quale, MD    Allergies  Allergen Reactions  . Codeine Nausea And Vomiting  . Sulfa Antibiotics Nausea Only    Patient Active Problem List   Diagnosis Date Noted  . SVT (supraventricular  tachycardia) ([place]) 07/09/2014  . Essential hypertension 07/06/2014  . CKD (chronic kidney disease) stage 3, GFR 30-59 ml/min 06/04/2014  . Skin rash 04/08/2014  . Edema leg 03/30/2014  . Hypothyroidism 03/09/2014  . Chronic anticoagulation 01/23/2014  . Ejection fraction   . Atrial flutter ([place]) 01/03/2014  . Chest pressure 01/03/2014  . H/O Bell's palsy     Past Medical History:  Diagnosis Date  . Anemia   . Anxiety   . Arthritis    "a little bit qwhere; mainly in my neck & shoulders" (07/09/2014)  . Bilateral leg edema   . Breast cancer, left breast ([place])    S/P chemo & radiation  . Chronic bronchitis ([place])    "usually get it q yr"  . Colon cancer ([place])    a. s/p resection 2002.  . Family history of adverse reaction to anesthesia    "son has PONV"  . H/O Bell's palsy   . Hypertension   . Hypothyroidism   . Paroxysmal atrial flutter ([place])    a. Dx 12/2013 - spont conv to NSR, placed on amiodarone. Back in flutter later 12/2013. s/p DCCV 01/2014 but did not hold - amio discontinued at that time.  Marland Kitchen PONV (postoperative nausea and vomiting)     Past Surgical History:  Procedure Laterality Date  . ABDOMINAL HERNIA REPAIR  01/2007  . ABDOMINAL HYSTERECTOMY  1975  . APPENDECTOMY  1975  . ATRIAL FLUTTER  ABLATION  07/09/2014  . ATRIAL FLUTTER ABLATION N/A 07/09/2014   Procedure: ATRIAL FLUTTER ABLATION;  Surgeon: Thompson Grayer, MD;  Location: [facility] CATH LAB;  Service: Cardiovascular;  Laterality: N/A;  . BREAST BIOPSY Left 1998  . BREAST LUMPECTOMY Left 1998  . CARDIOVERSION N/A 02/27/2014   Procedure: CARDIOVERSION;  Surgeon: Carlena Bjornstad, MD;  Location: [facility];  Service: Cardiovascular;  Laterality: N/A;  . CATARACT EXTRACTION W/ INTRAOCULAR LENS  IMPLANT, BILATERAL Bilateral 2011  . COLECTOMY    . HERNIA REPAIR    . LOW ANTERIOR BOWEL RESECTION  2002    Social History   Social History  . Marital status: Widowed    Spouse name: N/A  . Number of children: N/A  .  Years of education: N/A   Occupational History  . Not on file.   Social History Main Topics  . Smoking status: Former Smoker    Packs/day: 1.00    Years: 10.00    Types: Cigarettes  . Smokeless tobacco: Never Used     Comment: "quit smoking in the 1960's"  . Alcohol use Yes     Comment: 07/09/2014 "might have a drink a couple times/yr"  . Drug use: No  . Sexual activity: Not on file   Other Topics Concern  . Not on file   Social History Narrative  . No narrative on file    Family History  Problem Relation Age of Onset  . Diabetes Mother   . Cancer - Prostate Father      Review of Systems  Constitutional: Negative for chills, fever and weight loss.  HENT: Positive for hearing loss. Negative for nosebleeds, sinus pain and sore throat.   Eyes: Negative for blurred vision, double vision, discharge and redness.  Respiratory: Negative.  Negative for cough, shortness of breath and wheezing.   Cardiovascular: Positive for palpitations. Negative for chest pain.  Gastrointestinal: Negative for abdominal pain, diarrhea, nausea and vomiting.  Genitourinary: Negative for dysuria and hematuria.  Musculoskeletal: Negative for back pain and neck pain.  Skin: Positive for itching and rash.  Neurological: Negative.  Negative for dizziness, sensory change, focal weakness and headaches.  Endo/Heme/Allergies: Negative.   All other systems reviewed and are negative.  Vitals:   12/01/16 1647  BP: 118/71  Pulse: 83  Resp: 16  Temp: 98.1 F (36.7 C)     Physical Exam  Constitutional: She is oriented to person, place, and time. She appears well-developed and well-nourished.  HENT:  Head: Normocephalic and atraumatic.  Nose: Nose normal.  Mouth/Throat: Oropharynx is clear and moist.  Eyes: Pupils are equal, round, and reactive to light. Conjunctivae and EOM are normal.  Neck: Normal range of motion. Neck supple. No JVD present.  Cardiovascular: Normal rate, regular rhythm, normal  heart sounds and intact distal pulses.   Pulmonary/Chest: Effort normal and breath sounds normal.  Abdominal: Soft. Bowel sounds are normal. There is no tenderness.  Musculoskeletal: Normal range of motion.  Lymphadenopathy:    She has no cervical adenopathy.  Neurological: She is alert and oriented to person, place, and time. No sensory deficit. She exhibits normal muscle tone.  Skin: Skin is warm and dry. Capillary refill takes less than 2 seconds. Rash noted.  Several insect bite marks in both LE some of them look infected. Also has mild maculo-papular rash to torso.  Psychiatric: She has a normal mood and affect. Her behavior is normal.  Vitals reviewed.    ASSESSMENT & PLAN: Phylisha was seen today for  establish care and rash.  Diagnoses and all orders for this visit:  Allergic dermatitis -     methylPREDNISolone sodium succinate (SOLU-MEDROL) 125 mg/2 mL injection 125 mg; Inject 2 mLs (125 mg total) into the muscle once.  Chiggers  Infected insect bites of multiple sites  Other orders -     predniSONE (DELTASONE) 20 MG tablet; Take 2 tablets (40 mg total) by mouth daily with breakfast. -     cefadroxil (DURICEF) 500 MG capsule; Take 1 capsule (500 mg total) by mouth 2 (two) times daily.    Patient Instructions       IF you received an x-ray today, you will receive an invoice from [facility]. Please contact [facility] at [phone] with questions or concerns regarding your invoice.   IF you received labwork today, you will receive an invoice from [facility]. Please contact [facility] at [phone] with questions or concerns regarding your invoice.   Our billing staff will not be able to assist you with questions regarding bills from these companies.  You will be contacted with the lab results as soon as they are available. The fastest way to get your results is to activate your My Chart account. Instructions are located on the last page of this  paperwork. If you have not heard from [place] regarding the results in 2 weeks, please contact this office.     Rash A rash is a change in the color of the skin. A rash can also change the way your skin feels. There are many different conditions and factors that can cause a rash. Follow these instructions at home: Pay attention to any changes in your symptoms. Follow these instructions to help with your condition: Medicine Take or apply over-the-counter and prescription medicines only as told by your doctor. These may include:  Corticosteroid cream.  Anti-itch lotions.  Oral antihistamines.  Skin Care  Put cool compresses on the affected areas.  Try taking a bath with: ? Epsom salts. Follow the instructions on the packaging. You can get these at your local pharmacy or grocery store. ? Baking soda. Pour a small amount into the bath as told by your doctor. ? Colloidal oatmeal. Follow the instructions on the packaging. You can get this at your local pharmacy or grocery store.  Try putting baking soda paste onto your skin. Stir water into baking soda until it gets like a paste.  Do not scratch or rub your skin.  Avoid covering the rash. Make sure the rash is exposed to air as much as possible. General instructions  Avoid hot showers or baths, which can make itching worse. A cold shower may help.  Avoid scented soaps, detergents, and perfumes. Use gentle soaps, detergents, perfumes, and other cosmetic products.  Avoid anything that causes your rash. Keep a journal to help track what causes your rash. Write down: ? What you eat. ? What cosmetic products you use. ? What you drink. ? What you wear. This includes jewelry.  Keep all follow-up visits as told by your doctor. This is important. Contact a doctor if:  You sweat at night.  You lose weight.  You pee (urinate) more than normal.  You feel weak.  You throw up (vomit).  Your skin or the whites of your eyes look yellow  (jaundice).  Your skin: ? Tingles. ? Is numb.  Your rash: ? Does not go away after a few days. ? Gets worse.  You are: ? More thirsty than normal. ? More tired  than normal.  You have: ? New symptoms. ? Pain in your belly (abdomen). ? A fever. ? Watery poop (diarrhea). Get help right away if:  Your rash covers all or most of your body. The rash may or may not be painful.  You have blisters that: ? Are on top of the rash. ? Grow larger. ? Grow together. ? Are painful. ? Are inside your nose or mouth.  You have a rash that: ? Looks like purple pinprick-sized spots all over your body. ? Has a "bull's eye" or looks like a target. ? Is red and painful, causes your skin to peel, and is not from being in the sun too long. This information is not intended to replace advice given to you by your health care provider. Make sure you discuss any questions you have with your health care provider. Document Released: 10/04/2007 Document Revised: 09/23/2015 Document Reviewed: 09/02/2014 Elsevier Interactive Patient Education  2018 Elsevier Inc.      Agustina Caroli, MD [facility]

## 2017-03-12 ENCOUNTER — Ambulatory Visit (INDEPENDENT_AMBULATORY_CARE_PROVIDER_SITE_OTHER): Payer: Medicare Other

## 2017-03-12 ENCOUNTER — Ambulatory Visit (INDEPENDENT_AMBULATORY_CARE_PROVIDER_SITE_OTHER): Payer: Medicare Other | Admitting: Orthopaedic Surgery

## 2017-03-12 ENCOUNTER — Encounter (INDEPENDENT_AMBULATORY_CARE_PROVIDER_SITE_OTHER): Payer: Self-pay | Admitting: Orthopaedic Surgery

## 2017-03-12 DIAGNOSIS — M25511 Pain in right shoulder: Secondary | ICD-10-CM | POA: Diagnosis not present

## 2017-03-12 MED ORDER — DICLOFENAC SODIUM 1 % TD GEL: 2.0000 g | Freq: Four times a day (QID) | TRANSDERMAL | 3 refills | Status: DC

## 2017-03-12 MED ORDER — METHYLPREDNISOLONE ACETATE 40 MG/ML IJ SUSP
40.0000 mg | INTRAMUSCULAR | Status: AC | PRN
  Administered 2017-03-12: 40 mg via INTRA_ARTICULAR

## 2017-03-12 MED ORDER — LIDOCAINE HCL 1 % IJ SOLN
3.0000 mL | INTRAMUSCULAR | Status: AC | PRN
  Administered 2017-03-12: 3 mL

## 2017-03-12 NOTE — Progress Notes (Signed)
Office Visit Note   Patient: [name]           Date of Birth: 12-29-1927           MRN: [id number] Visit Date: 03/12/2017              Requested by: Haywood Pao, MD [address] [place], [place] [address] PCP: Haywood Pao, MD   Assessment & Plan: Visit Diagnoses:  1. Acute pain of right shoulder     Plan: I spoke with her about trying a subacromial steroid injection in her shoulder to decrease her pain.  She understands with deficit of the rotator cuff that this may not be treatable with surgery given her age however certainly physical therapy can help.  I talked to her family about this as well and they will let us know if they want [place] to order physical therapy for her shoulder.  I explained the risk and benefits of steroid injections and she did wish to have this done and she tolerated it well.  Follow-Up Instructions: Return if symptoms worsen or fail to improve.   Orders:  Orders Placed This Encounter  Procedures  . XR Shoulder Right   Meds ordered this encounter  Medications  . diclofenac sodium (VOLTAREN) 1 % GEL    Sig: Apply 2 g 4 (four) times daily topically.    Dispense:  100 g    Refill:  3      Procedures: Large Joint Inj on 03/12/2017 6:09 PM Indications: pain and diagnostic evaluation Details: 22 G 1.5 in needle  Arthrogram: No  Medications: 3 mL lidocaine 1 %; 40 mg methylPREDNISolone acetate 40 MG/ML Outcome: tolerated well, no immediate complications Procedure, treatment alternatives, risks and benefits explained, specific risks discussed. Consent was given by the patient. Immediately prior to procedure a time out was called to verify the correct patient, procedure, equipment, support staff and site/side marked as required. Patient was prepped and draped in the usual sterile fashion.       Clinical Data: No additional findings.   Subjective: Chief Complaint  Patient presents with  . Right Shoulder - Pain  The patient  is a very pleasant and active 83 year old female who has a week to 2-week history of worsening right shoulder pain that occurred when she was reaching to get in a sport utility vehicle.  She is right-hand dominant and states she has had problems now with lifting her arm above her head.  She says this is an acute problem for her and she has not had any issues before this incident.  She denies any neck pain and denies any numbness and tingling in her hand.  HPI  Review of Systems  She currently denies any headache, chest pain, shortness of breath, fever, chills, nausea, vomiting. Objective: Vital Signs: There were no vitals taken for this visit.  Physical Exam She is alert and oriented x3 and in no acute distress Ortho Exam Examination of her right shoulder does show some weakness and deficiency in the rotator cuff with abduction and external rotation.  The shoulder is clinically well located though.  She has definitely difficulty with trying to abduct her shoulder above her head and is using her deltoids more to do this.  Her elbow wrist and hand exam is normal as is her neck exam. Specialty Comments:  No specialty comments available.  Imaging: Xr Shoulder Right  Result Date: 03/12/2017 3 views of the right shoulder show well located shoulder.  The humeral head is in a central position within the glenoid.  There is no acute findings otherwise.    PMFS History: Patient Active Problem List   Diagnosis Date Noted  . Acute pain of right shoulder 03/12/2017  . Allergic dermatitis 12/01/2016  . Chiggers 12/01/2016  . Infected insect bites of multiple sites 12/01/2016  . SVT (supraventricular tachycardia) ([place]) 07/09/2014  . Essential hypertension 07/06/2014  . CKD (chronic kidney disease) stage 3, GFR 30-59 ml/min ([facility]) 06/04/2014  . Skin rash 04/08/2014  . Edema leg 03/30/2014  . Hypothyroidism 03/09/2014  . Chronic anticoagulation 01/23/2014  . Ejection fraction   . Atrial flutter  ([place]) 01/03/2014  . Chest pressure 01/03/2014  . H/O Bell's palsy    Past Medical History:  Diagnosis Date  . Anemia   . Anxiety   . Arthritis    "a little bit qwhere; mainly in my neck & shoulders" (07/09/2014)  . Bilateral leg edema   . Breast cancer, left breast ([place])    S/P chemo & radiation  . Chronic bronchitis ([place])    "usually get it q yr"  . Colon cancer ([place])    a. s/p resection 2002.  . Family history of adverse reaction to anesthesia    "son has PONV"  . H/O Bell's palsy   . Hypertension   . Hypothyroidism   . Paroxysmal atrial flutter ([place])    a. Dx 12/2013 - spont conv to NSR, placed on amiodarone. Back in flutter later 12/2013. s/p DCCV 01/2014 but did not hold - amio discontinued at that time.  Marland Kitchen PONV (postoperative nausea and vomiting)     Family History  Problem Relation Age of Onset  . Diabetes Mother   . Cancer - Prostate Father     Past Surgical History:  Procedure Laterality Date  . ABDOMINAL HERNIA REPAIR  01/2007  . ABDOMINAL HYSTERECTOMY  1975  . APPENDECTOMY  1975  . ATRIAL FLUTTER ABLATION  07/09/2014  . BREAST BIOPSY Left 1998  . BREAST LUMPECTOMY Left 1998  . CATARACT EXTRACTION W/ INTRAOCULAR LENS  IMPLANT, BILATERAL Bilateral 2011  . COLECTOMY    . HERNIA REPAIR    . LOW ANTERIOR BOWEL RESECTION  2002   Social History   Occupational History  . Not on file  Tobacco Use  . Smoking status: Former Smoker    Packs/day: 1.00    Years: 10.00    Pack years: 10.00    Types: Cigarettes  . Smokeless tobacco: Never Used  . Tobacco comment: "quit smoking in the 1960's"  Substance and Sexual Activity  . Alcohol use: Yes    Comment: 07/09/2014 "might have a drink a couple times/yr"  . Drug use: No  . Sexual activity: Not on file

## 2017-05-08 ENCOUNTER — Encounter (INDEPENDENT_AMBULATORY_CARE_PROVIDER_SITE_OTHER): Payer: Self-pay | Admitting: Orthopaedic Surgery

## 2017-05-08 ENCOUNTER — Ambulatory Visit (INDEPENDENT_AMBULATORY_CARE_PROVIDER_SITE_OTHER): Payer: Medicare Other | Admitting: Orthopaedic Surgery

## 2017-05-08 ENCOUNTER — Other Ambulatory Visit (INDEPENDENT_AMBULATORY_CARE_PROVIDER_SITE_OTHER): Payer: Self-pay

## 2017-05-08 DIAGNOSIS — G8929 Other chronic pain: Secondary | ICD-10-CM | POA: Insufficient documentation

## 2017-05-08 DIAGNOSIS — M25511 Pain in right shoulder: Principal | ICD-10-CM

## 2017-05-08 NOTE — Progress Notes (Signed)
The patient is a 84 year old female who comes in for follow-up for her right shoulder.  I provided a subacromial steroid injection in her right shoulder in November and she said helped for only about 2 days.  She is been recently diagnosed with osteopenia.  The shoulder hurts mainly with overhead activities and she gets up in the morning.  She denies any injuries.  On examination of her shoulder she does have signs of impingement and bursitis mainly with pain in the subacromial outlet.  Her shoulder actually functions well and is well located otherwise.  There could be some chronic tearing of the rotator cuff.  Again we went over her x-rays from her last visit sure that she has a well located shoulder with no arthritis in her shoulder at all.  The [facility] joint looks normal as well as the glenohumeral joint.  The humeral head is in a central position within the glenoid itself.  At this point I think she would benefit from Voltaren gel around her shoulder alternating with Aspercreme with lidocaine.  Also she would benefit from outpatient physical therapy to help improve her shoulder function and decrease her pain through things such as iontophoresis and phonophoresis as well as ultrasound and e-stim and potentially even dry needling.  All questions concerns were answered and addressed.  We will see her back in a month see how she is doing overall.  I am not opposed to trying another steroid injection at that visit if needed.

## 2017-05-15 ENCOUNTER — Ambulatory Visit: Payer: Medicare Other | Attending: Orthopaedic Surgery | Admitting: Physical Therapy

## 2017-05-15 ENCOUNTER — Encounter: Payer: Self-pay | Admitting: Physical Therapy

## 2017-05-15 DIAGNOSIS — M25511 Pain in right shoulder: Secondary | ICD-10-CM | POA: Insufficient documentation

## 2017-05-15 DIAGNOSIS — M6281 Muscle weakness (generalized): Secondary | ICD-10-CM | POA: Diagnosis present

## 2017-05-15 DIAGNOSIS — R293 Abnormal posture: Secondary | ICD-10-CM | POA: Insufficient documentation

## 2017-05-15 NOTE — Patient Instructions (Signed)
SHOULDER: Flexion - Supine (Cane)    Hold cane in both hands. Raise arms up overhead. Do not allow back to arch. Hold _2-3__ seconds. _10__ reps per set, __2-3_ sets per day, _7__ days per week. Make sure your right arm does as much of the work as able.  Let your left arm assist with the movement.   Cane Exercise: Abduction    Hold cane with right hand over end, palm-up, with other hand palm-down. Move arm out from side and up by pushing with other arm. Hold ___2-3_ seconds. Repeat __10__ times. Do ___2-3_ sessions per day.  Make sure your right arm does as much of the work as it's able and then let the left arm assist.    Scapular Retraction (Standing)    With arms at sides, pinch shoulder blades together.  Hold for 5 seconds.  TRY TO PUT YOUR ELBOWS INTO YOUR BACK POCKETS. Repeat _10___ times per set. Do _1___ sets per session. Do __2-3__ sessions per day.

## 2017-05-15 NOTE — Therapy (Signed)
[place], [place], [address] Phone: [phone]   Fax:  [phone]  Physical Therapy Evaluation  Patient Details  Name: [name] MRN: [id number] Date of Birth: [birth year] Referring Provider: Dr. Jean Rosenthal   Encounter Date: 05/15/2017  PT End of Session - 05/15/17 1301    Visit Number  1    Number of Visits  12    Date for PT Re-Evaluation  06/26/17    Authorization Type  UHC Medicare $20 copay    PT Start Time  6962    PT Stop Time  1232    PT Time Calculation (min)  47 min    Activity Tolerance  Patient tolerated treatment well    Behavior During Therapy  [facility] for tasks assessed/performed       Past Medical History:  Diagnosis Date  . Anemia   . Anxiety   . Arthritis    "a little bit qwhere; mainly in my neck & shoulders" (07/09/2014)  . Bilateral leg edema   . Breast cancer, left breast ([place])    S/P chemo & radiation  . Chronic bronchitis ([place])    "usually get it q yr"  . Colon cancer ([place])    a. s/p resection 2002.  . Family history of adverse reaction to anesthesia    "son has PONV"  . H/O Bell's palsy   . Hypertension   . Hypothyroidism   . Paroxysmal atrial flutter ([place])    a. Dx 12/2013 - spont conv to NSR, placed on amiodarone. Back in flutter later 12/2013. s/p DCCV 01/2014 but did not hold - amio discontinued at that time.  Marland Kitchen PONV (postoperative nausea and vomiting)     Past Surgical History:  Procedure Laterality Date  . ABDOMINAL HERNIA REPAIR  01/2007  . ABDOMINAL HYSTERECTOMY  1975  . APPENDECTOMY  1975  . ATRIAL FLUTTER ABLATION  07/09/2014  . ATRIAL FLUTTER ABLATION N/A 07/09/2014   Procedure: ATRIAL FLUTTER ABLATION;  Surgeon: Thompson Grayer, MD;  Location: [facility] CATH LAB;  Service: Cardiovascular;  Laterality: N/A;  . BREAST BIOPSY Left 1998  . BREAST LUMPECTOMY Left 1998  . CARDIOVERSION N/A 02/27/2014   Procedure: CARDIOVERSION;  Surgeon: Carlena Bjornstad, MD;  Location: [facility];  Service: Cardiovascular;  Laterality: N/A;  . CATARACT EXTRACTION W/ INTRAOCULAR LENS  IMPLANT, BILATERAL Bilateral 2011  . COLECTOMY    . HERNIA REPAIR    . LOW ANTERIOR BOWEL RESECTION  2002    There were no vitals filed for this visit.   Subjective Assessment - 05/15/17 1149    Subjective  Pt is an 84 y/o female who presents to OPPT for Rt shoulder pain x 4-6 weeks.  Pt had injection approx 1 month ago with short term benefit.  Pt unable to recall cause of injury, but recalls initial pain with overhead reaching to get into car holding onto handle.      Limitations  Lifting    Diagnostic tests  x rays negative    Patient Stated Goals  improve pain, be able to lift and reach out/carry pots and pans     Currently in Pain?  Yes    Pain Score  5  up to 9-10/10; at best 0/10    Pain Location  Shoulder    Pain Orientation  Right;Posterior;Lateral;Anterior    Pain Descriptors / Indicators  Aching;Sharp    Pain Type  Chronic pain;Acute pain    Pain Onset  More than  a month ago    Pain Frequency  Intermittent    Aggravating Factors   reaching forward and overhead    Pain Relieving Factors  heat, cream provided by MD         [facility] PT Assessment - 05/15/17 1158      Assessment   Medical Diagnosis  Rt shoulder pain    Referring Provider  Dr. Jean Rosenthal    Onset Date/Surgical Date  -- 4-6 weeks ago    Hand Dominance  Right    Next MD Visit  06/05/17    Prior Therapy  following fall on Lt shoulder 3 years ago; otherwise none for Rt shoulder      Precautions   Precautions  None      Restrictions   Weight Bearing Restrictions  No      Balance Screen   Has the patient fallen in the past 6 months  No    Has the patient had a decrease in activity level because of a fear of falling?   Yes    Is the patient reluctant to leave their home because of a fear of falling?   No      Home Environment   Living Environment  Private residence     Living Arrangements  Alone    Type of [place]    Additional Comments  still independent with ADLs-needs some modification due to Rt shoulder      Prior Function   Level of Independence  Independent    Vocation  Retired    Leisure  play with cat; go places with daughter/son-in-law      Cognition   Overall Cognitive Status  Within Functional Limits for tasks assessed      Observation/Other Assessments   Focus on Therapeutic Outcomes (FOTO)   45 (55% limited; predicted 40% limited)      Posture/Postural Control   Posture/Postural Control  Postural limitations    Postural Limitations  Rounded Shoulders;Forward head;Increased thoracic kyphosis      ROM / Strength   AROM / PROM / Strength  AROM;Strength;PROM      AROM   Overall AROM Comments  flexion and abduction measured in sitting; Lt shoulder WNL    AROM Assessment Site  Shoulder    Right/Left Shoulder  Right    Right Shoulder Flexion  50 Degrees    Right Shoulder ABduction  0 Degrees unable sitting; 55 in supine    Right Shoulder Internal Rotation  85 Degrees    Right Shoulder External Rotation  85 Degrees      PROM   PROM Assessment Site  Shoulder    Right/Left Shoulder  Right    Right Shoulder Flexion  140 Degrees    Right Shoulder ABduction  134 Degrees      Strength   Overall Strength Comments  pt able to hold flexion and abduction against gravity at 90 degrees    Strength Assessment Site  Shoulder    Right/Left Shoulder  Right    Right Shoulder Flexion  1/5    Right Shoulder ABduction  1/5    Right Shoulder Internal Rotation  4/5    Right Shoulder External Rotation  3/5      Palpation   Palpation comment  no significant tenderness noted with palpation Rt shoulder             Objective measurements completed on examination: See above findings.      [facility] Adult PT Treatment/Exercise - 05/15/17  1158      Exercises   Exercises  Shoulder      Shoulder Exercises: Supine   Flexion  AAROM;10 reps     Flexion Limitations  cane      Shoulder Exercises: Standing   ABduction  AAROM;Right;10 reps    ABduction Limitations  cane    Retraction  Both;10 reps    Retraction Limitations  cues for technique with 5 second hold             PT Education - 05/15/17 1300    Education provided  Yes    Education Details  HEP    Person(s) Educated  Patient    Methods  Explanation;Demonstration;Tactile cues;Verbal cues;Handout    Comprehension  Verbalized understanding;Returned demonstration;Verbal cues required;Tactile cues required;Need further instruction          PT Long Term Goals - 05/15/17 1305      PT LONG TERM GOAL #1   Title  independent with HEP    Status  New    Target Date  06/26/17      PT LONG TERM GOAL #2   Title  improve Rt shoulder flexion and abduction AROM to at least 90 degrees for improved functional mobiltiy.    Status  New    Target Date  06/26/17      PT LONG TERM GOAL #3   Title  improve FOTO score to at least 55 (45% limited) for improved functional mobility    Status  New    Target Date  06/26/17      PT LONG TERM GOAL #4   Title  report 50% improvement in ADLs for improved function    Status  New    Target Date  06/26/17      PT LONG TERM GOAL #5   Title  report pain < 4/10 with activity for improved function    Status  New    Target Date  06/26/17             Plan - 05/15/17 1301    Clinical Impression Statement  Pt is an 84 y/o female who presents to OPPT for Rt shoulder pain without known injury.  Pt demonstrates significant AROM and strength deficits, and clinical findings suggestive of possible RTC tear, but at this time pt wants to avoid surgery, so unsure if further imaging would be warranted.  Pt will benefit from PT to address the listed defitis and maximize function as much as possible.      History and Personal Factors relevant to plan of care:  hx breast and colon cancer, anxiety, HTN, atrial flutter    Clinical Presentation   Evolving    Clinical Presentation due to:  lack of ability to move arm actively    Clinical Decision Making  Moderate    Rehab Potential  Fair    PT Frequency  2x / week    PT Duration  6 weeks    PT Treatment/Interventions  ADLs/Self Care Home Management;Cryotherapy;Moist Heat;Therapeutic exercise;Therapeutic activities;Functional mobility training;Ultrasound;Electrical Stimulation;Iontophoresis 4mg /ml Dexamethasone;Neuromuscular re-education;Patient/family education;Manual techniques;Vasopneumatic Device;Taping;Dry needling;Passive range of motion    PT Next Visit Plan  review HEP, continue AAROM exercises, isometrics; eccentric control exercises    Consulted and Agree with Plan of Care  Patient       Patient will benefit from skilled therapeutic intervention in order to improve the following deficits and impairments:  Postural dysfunction, Decreased range of motion, Decreased strength, Pain, Impaired UE functional use  Visit Diagnosis: Acute  pain of right shoulder - Plan: PT plan of care cert/re-cert  Abnormal posture - Plan: PT plan of care cert/re-cert  Muscle weakness (generalized) - Plan: PT plan of care cert/re-cert     Problem List Patient Active Problem List   Diagnosis Date Noted  . Chronic right shoulder pain 05/08/2017  . Acute pain of right shoulder 03/12/2017  . Allergic dermatitis 12/01/2016  . Chiggers 12/01/2016  . Infected insect bites of multiple sites 12/01/2016  . SVT (supraventricular tachycardia) ([place]) 07/09/2014  . Essential hypertension 07/06/2014  . CKD (chronic kidney disease) stage 3, GFR 30-59 ml/min (HCC) 06/04/2014  . Skin rash 04/08/2014  . Edema leg 03/30/2014  . Hypothyroidism 03/09/2014  . Chronic anticoagulation 01/23/2014  . Ejection fraction   . Atrial flutter ([place]) 01/03/2014  . Chest pressure 01/03/2014  . H/O Bell's palsy       Laureen Abrahams, PT, DPT 05/15/17 1:09 PM    [place]  [facility] [address] [place], [place], [address] Phone: [phone]   Fax:  [phone]  Name: [name] MRN: [id number] Date of Birth: 11/22/1927

## 2017-05-17 ENCOUNTER — Ambulatory Visit: Payer: Medicare Other | Admitting: Physical Therapy

## 2017-05-17 ENCOUNTER — Encounter: Payer: Self-pay | Admitting: Physical Therapy

## 2017-05-17 DIAGNOSIS — R293 Abnormal posture: Secondary | ICD-10-CM

## 2017-05-17 DIAGNOSIS — M6281 Muscle weakness (generalized): Secondary | ICD-10-CM

## 2017-05-17 DIAGNOSIS — M25511 Pain in right shoulder: Secondary | ICD-10-CM

## 2017-05-17 NOTE — Therapy (Signed)
[place], [place], [address] Phone: [phone]   Fax:  [phone]  Physical Therapy Treatment  Patient Details  Name: [name] MRN: [id number] Date of Birth: Sep 24, 1927 Referring Provider: Dr. Jean Rosenthal   Encounter Date: 05/17/2017  PT End of Session - 05/17/17 1145    Visit Number  2    Number of Visits  12    Date for PT Re-Evaluation  06/26/17    Authorization Type  UHC Medicare $20 copay    PT Start Time  1146    PT Stop Time  1227    PT Time Calculation (min)  41 min    Activity Tolerance  Patient tolerated treatment well    Behavior During Therapy  [facility] for tasks assessed/performed       Past Medical History:  Diagnosis Date  . Anemia   . Anxiety   . Arthritis    "a little bit qwhere; mainly in my neck & shoulders" (07/09/2014)  . Bilateral leg edema   . Breast cancer, left breast ([place])    S/P chemo & radiation  . Chronic bronchitis ([place])    "usually get it q yr"  . Colon cancer ([place])    a. s/p resection 2002.  . Family history of adverse reaction to anesthesia    "son has PONV"  . H/O Bell's palsy   . Hypertension   . Hypothyroidism   . Paroxysmal atrial flutter ([place])    a. Dx 12/2013 - spont conv to NSR, placed on amiodarone. Back in flutter later 12/2013. s/p DCCV 01/2014 but did not hold - amio discontinued at that time.  Marland Kitchen PONV (postoperative nausea and vomiting)     Past Surgical History:  Procedure Laterality Date  . ABDOMINAL HERNIA REPAIR  01/2007  . ABDOMINAL HYSTERECTOMY  1975  . APPENDECTOMY  1975  . ATRIAL FLUTTER ABLATION  07/09/2014  . ATRIAL FLUTTER ABLATION N/A 07/09/2014   Procedure: ATRIAL FLUTTER ABLATION;  Surgeon: Thompson Grayer, MD;  [facility] CATH LAB;  Service: Cardiovascular;  Laterality: N/A;  . BREAST BIOPSY Left 1998  . BREAST LUMPECTOMY Left 1998  . CARDIOVERSION N/A 02/27/2014   Procedure: CARDIOVERSION;  Surgeon: Carlena Bjornstad,  MD;  Location: [facility];  Service: Cardiovascular;  Laterality: N/A;  . CATARACT EXTRACTION W/ INTRAOCULAR LENS  IMPLANT, BILATERAL Bilateral 2011  . COLECTOMY    . HERNIA REPAIR    . LOW ANTERIOR BOWEL RESECTION  2002    There were no vitals filed for this visit.  Subjective Assessment - 05/17/17 1146    Subjective  Sore today but better than yesterday.     Patient Stated Goals  improve pain, be able to lift and reach out/carry pots and pans     Currently in Pain?  No/denies                      [facility] Adult PT Treatment/Exercise - 05/17/17 0001      Shoulder Exercises: Supine   External Rotation  Other (comment) with cane    Flexion  AAROM;15 reps & without cane    Other Supine Exercises  ER with red tband      Shoulder Exercises: Seated   Flexion  AAROM;15 reps    Other Seated Exercises  rows red tband    Other Seated Exercises  reach overhead with ball      Shoulder Exercises: Sidelying   External Rotation  AROM  ABduction  AROM                  PT Long Term Goals - 05/15/17 1305      PT LONG TERM GOAL #1   Title  independent with HEP    Status  New    Target Date  06/26/17      PT LONG TERM GOAL #2   Title  improve Rt shoulder flexion and abduction AROM to at least 90 degrees for improved functional mobiltiy.    Status  New    Target Date  06/26/17      PT LONG TERM GOAL #3   Title  improve FOTO score to at least 55 (45% limited) for improved functional mobility    Status  New    Target Date  06/26/17      PT LONG TERM GOAL #4   Title  report 50% improvement in ADLs for improved function    Status  New    Target Date  06/26/17      PT LONG TERM GOAL #5   Title  report pain < 4/10 with activity for improved function    Status  New    Target Date  06/26/17            Plan - 05/17/17 1231    Clinical Impression Statement  Pt demo full ROM OH with occasional complaints of joint line pain. Frequent VC required for  periscapular activation.     PT Treatment/Interventions  ADLs/Self Care Home Management;Cryotherapy;Moist Heat;Therapeutic exercise;Therapeutic activities;Functional mobility training;Ultrasound;Electrical Stimulation;Iontophoresis 4mg /ml Dexamethasone;Neuromuscular re-education;Patient/family education;Manual techniques;Vasopneumatic Device;Taping;Dry needling;Passive range of motion    PT Next Visit Plan  AROM OH reach, eccentric control    PT Home Exercise Plan  sidelying ER, seated flexion with wand, row    Consulted and Agree with Plan of Care  Patient       Patient will benefit from skilled therapeutic intervention in order to improve the following deficits and impairments:  Postural dysfunction, Decreased range of motion, Decreased strength, Pain, Impaired UE functional use  Visit Diagnosis: Acute pain of right shoulder  Abnormal posture  Muscle weakness (generalized)     Problem List Patient Active Problem List   Diagnosis Date Noted  . Chronic right shoulder pain 05/08/2017  . Acute pain of right shoulder 03/12/2017  . Allergic dermatitis 12/01/2016  . Chiggers 12/01/2016  . Infected insect bites of multiple sites 12/01/2016  . SVT (supraventricular tachycardia) ([place]) 07/09/2014  . Essential hypertension 07/06/2014  . CKD (chronic kidney disease) stage 3, GFR 30-59 ml/min (HCC) 06/04/2014  . Skin rash 04/08/2014  . Edema leg 03/30/2014  . Hypothyroidism 03/09/2014  . Chronic anticoagulation 01/23/2014  . Ejection fraction   . Atrial flutter ([place]) 01/03/2014  . Chest pressure 01/03/2014  . H/O Bell's palsy     Keath Matera C. Jemila Camille PT, DPT 05/17/17 12:35 PM   [facility] [address] [place], [place], [address] Phone: [phone]   Fax:  [phone]  Name: [name] MRN: [id number] Date of Birth: Jun 29, 1927

## 2017-05-22 ENCOUNTER — Encounter: Payer: Self-pay | Admitting: Physical Therapy

## 2017-05-22 ENCOUNTER — Ambulatory Visit: Payer: Medicare Other | Admitting: Physical Therapy

## 2017-05-22 DIAGNOSIS — M25511 Pain in right shoulder: Secondary | ICD-10-CM

## 2017-05-22 DIAGNOSIS — R293 Abnormal posture: Secondary | ICD-10-CM

## 2017-05-22 DIAGNOSIS — M6281 Muscle weakness (generalized): Secondary | ICD-10-CM

## 2017-05-22 NOTE — Therapy (Signed)
[place], [place], [address] Phone: [phone]   Fax:  [phone]  Physical Therapy Treatment  Patient Details  Name: [name] MRN: [id number] Date of Birth: [birth year] Referring Provider: Dr. Jean Rosenthal   Encounter Date: 05/22/2017  PT End of Session - 05/22/17 0813    Visit Number  3    Number of Visits  12    Date for PT Re-Evaluation  06/26/17    Authorization Type  UHC Medicare $20 copay    PT Start Time  0806    PT Stop Time  0845    PT Time Calculation (min)  39 min       Past Medical History:  Diagnosis Date  . Anemia   . Anxiety   . Arthritis    "a little bit qwhere; mainly in my neck & shoulders" (07/09/2014)  . Bilateral leg edema   . Breast cancer, left breast ([place])    S/P chemo & radiation  . Chronic bronchitis ([place])    "usually get it q yr"  . Colon cancer ([place])    a. s/p resection 2002.  . Family history of adverse reaction to anesthesia    "son has PONV"  . H/O Bell's palsy   . Hypertension   . Hypothyroidism   . Paroxysmal atrial flutter ([place])    a. Dx 12/2013 - spont conv to NSR, placed on amiodarone. Back in flutter later 12/2013. s/p DCCV 01/2014 but did not hold - amio discontinued at that time.  Marland Kitchen PONV (postoperative nausea and vomiting)     Past Surgical History:  Procedure Laterality Date  . ABDOMINAL HERNIA REPAIR  01/2007  . ABDOMINAL HYSTERECTOMY  1975  . APPENDECTOMY  1975  . ATRIAL FLUTTER ABLATION  07/09/2014  . ATRIAL FLUTTER ABLATION N/A 07/09/2014   Procedure: ATRIAL FLUTTER ABLATION;  Surgeon: Thompson Grayer, MD;  Location: [facility] CATH LAB;  Service: Cardiovascular;  Laterality: N/A;  . BREAST BIOPSY Left 1998  . BREAST LUMPECTOMY Left 1998  . CARDIOVERSION N/A 02/27/2014   Procedure: CARDIOVERSION;  Surgeon: Carlena Bjornstad, MD;  Location: [facility];  Service: Cardiovascular;  Laterality: N/A;  . CATARACT EXTRACTION W/ INTRAOCULAR LENS   IMPLANT, BILATERAL Bilateral 2011  . COLECTOMY    . HERNIA REPAIR    . LOW ANTERIOR BOWEL RESECTION  2002    There were no vitals filed for this visit.  Subjective Assessment - 05/22/17 0809    Subjective  About the same.     Currently in Pain?  Yes    Pain Score  4     Pain Location  Shoulder    Pain Orientation  Right    Pain Descriptors / Indicators  Aching    Aggravating Factors   reaching forward and overhead    Pain Relieving Factors  heat, cream provided my MD          [facility] PT Assessment - 05/22/17 0001      AROM   Right Shoulder Flexion  140 Degrees needs to flex able to reach up and come down                  [facility] Adult PT Treatment/Exercise - 05/22/17 0001      Shoulder Exercises: Supine   External Rotation  Other (comment) with cane    Flexion  AAROM;15 reps & without cane, punch    Other Supine Exercises  Flexion at 90 rhythmic stab, ER at neutral  rhythmic stab     Other Supine Exercises  bicep curls 1#, tricep pulls yellow band       Shoulder Exercises: Seated   Horizontal ABduction  AAROM seated UE ranger     Flexion  AAROM UE ranger seated , and cane overhead     Other Seated Exercises  rows red tband      Shoulder Exercises: Sidelying   External Rotation  AROM eccentric control focus    ABduction  -- declined      Shoulder Exercises: Standing   ABduction  AAROM;Right;10 reps    ABduction Limitations  UE ranger     Retraction  Both;10 reps    Retraction Limitations  cues for technique with 5 second hold      Shoulder Exercises: Pulleys   Flexion  2 minutes                  PT Long Term Goals - 05/15/17 1305      PT LONG TERM GOAL #1   Title  independent with HEP    Status  New    Target Date  06/26/17      PT LONG TERM GOAL #2   Title  improve Rt shoulder flexion and abduction AROM to at least 90 degrees for improved functional mobiltiy.    Status  New    Target Date  06/26/17      PT LONG TERM GOAL #3   Title   improve FOTO score to at least 55 (45% limited) for improved functional mobility    Status  New    Target Date  06/26/17      PT LONG TERM GOAL #4   Title  report 50% improvement in ADLs for improved function    Status  New    Target Date  06/26/17      PT LONG TERM GOAL #5   Title  report pain < 4/10 with activity for improved function    Status  New    Target Date  06/26/17            Plan - 05/22/17 0846    Clinical Impression Statement  Pt reports more soreness after last session. Continued with AAROM and eccentric control. Able to reach overhead with elbow flexion today. Unable to lift into abduction in standing or supine. She is able to perform AAROM abduction without pain.     PT Next Visit Plan  AROM OH reach, eccentric control    PT Home Exercise Plan  AAROM abduction with cane in standing, supine cane flexion,  sidelying ER, seated flexion with wand, row    Consulted and Agree with Plan of Care  Patient       Patient will benefit from skilled therapeutic intervention in order to improve the following deficits and impairments:  Postural dysfunction, Decreased range of motion, Decreased strength, Pain, Impaired UE functional use  Visit Diagnosis: Acute pain of right shoulder  Abnormal posture  Muscle weakness (generalized)     Problem List Patient Active Problem List   Diagnosis Date Noted  . Chronic right shoulder pain 05/08/2017  . Acute pain of right shoulder 03/12/2017  . Allergic dermatitis 12/01/2016  . Chiggers 12/01/2016  . Infected insect bites of multiple sites 12/01/2016  . SVT (supraventricular tachycardia) ([place]) 07/09/2014  . Essential hypertension 07/06/2014  . CKD (chronic kidney disease) stage 3, GFR 30-59 ml/min (HCC) 06/04/2014  . Skin rash 04/08/2014  . Edema leg 03/30/2014  . Hypothyroidism 03/09/2014  .  Chronic anticoagulation 01/23/2014  . Ejection fraction   . Atrial flutter ([place]) 01/03/2014  . Chest pressure 01/03/2014  . H/O  Bell's palsy     Dorene Ar, [place] 05/22/2017, 8:55 AM  [facility] [address] [place], [place], [address] Phone: [phone]   Fax:  [phone]  Name: [name] MRN: [id number] Date of Birth: 18-Nov-1927

## 2017-05-24 ENCOUNTER — Encounter: Payer: Self-pay | Admitting: Physical Therapy

## 2017-05-24 ENCOUNTER — Ambulatory Visit: Payer: Medicare Other | Admitting: Physical Therapy

## 2017-05-24 DIAGNOSIS — M25511 Pain in right shoulder: Secondary | ICD-10-CM | POA: Diagnosis not present

## 2017-05-24 DIAGNOSIS — R293 Abnormal posture: Secondary | ICD-10-CM

## 2017-05-24 DIAGNOSIS — M6281 Muscle weakness (generalized): Secondary | ICD-10-CM

## 2017-05-24 NOTE — Therapy (Signed)
[place], [place], [address] Phone: [phone]   Fax:  [phone]  Physical Therapy Treatment  Patient Details  Name: [name] MRN: [id number] Date of Birth: 08-07-1927 Referring Provider: Dr. Jean Rosenthal   Encounter Date: 05/24/2017  PT End of Session - 05/24/17 1049    Visit Number  4    Number of Visits  12    Date for PT Re-Evaluation  06/26/17    Authorization Type  [employer] Medicare $20 copay    PT Start Time  1015 hot pack    PT Stop Time  1058    PT Time Calculation (min)  43 min    Activity Tolerance  Patient tolerated treatment well    Behavior During Therapy  [facility] for tasks assessed/performed       Past Medical History:  Diagnosis Date  . Anemia   . Anxiety   . Arthritis    "a little bit qwhere; mainly in my neck & shoulders" (07/09/2014)  . Bilateral leg edema   . Breast cancer, left breast ([place])    S/P chemo & radiation  . Chronic bronchitis ([place])    "usually get it q yr"  . Colon cancer ([place])    a. s/p resection 2002.  . Family history of adverse reaction to anesthesia    "son has PONV"  . H/O Bell's palsy   . Hypertension   . Hypothyroidism   . Paroxysmal atrial flutter ([place])    a. Dx 12/2013 - spont conv to NSR, placed on amiodarone. Back in flutter later 12/2013. s/p DCCV 01/2014 but did not hold - amio discontinued at that time.  Marland Kitchen PONV (postoperative nausea and vomiting)     Past Surgical History:  Procedure Laterality Date  . ABDOMINAL HERNIA REPAIR  01/2007  . ABDOMINAL HYSTERECTOMY  1975  . APPENDECTOMY  1975  . ATRIAL FLUTTER ABLATION  07/09/2014  . ATRIAL FLUTTER ABLATION N/A 07/09/2014   Procedure: ATRIAL FLUTTER ABLATION;  Surgeon: Thompson Grayer, MD;  Location: [facility] CATH LAB;  Service: Cardiovascular;  Laterality: N/A;  . BREAST BIOPSY Left 1998  . BREAST LUMPECTOMY Left 1998  . CARDIOVERSION N/A 02/27/2014   Procedure: CARDIOVERSION;  Surgeon:  Carlena Bjornstad, MD;  Location: [place];  Service: Cardiovascular;  Laterality: N/A;  . CATARACT EXTRACTION W/ INTRAOCULAR LENS  IMPLANT, BILATERAL Bilateral 2011  . COLECTOMY    . HERNIA REPAIR    . LOW ANTERIOR BOWEL RESECTION  2002    There were no vitals filed for this visit.  Subjective Assessment - 05/24/17 1014    Subjective  reports increased soreness and aching following strengthening exercises last week; reports soreness has lingered and she feels like "I'm not doing well."  heat helped so requesting that today    Patient Stated Goals  improve pain, be able to lift and reach out/carry pots and pans     Currently in Pain?  Yes    Pain Score  2     Pain Location  Shoulder    Pain Orientation  Right    Pain Descriptors / Indicators  Aching    Pain Type  Chronic pain;Acute pain    Pain Onset  More than a month ago    Pain Frequency  Intermittent    Aggravating Factors   reaching forward and overhead    Pain Relieving Factors  heat, cream provided by MD  [facility] Adult PT Treatment/Exercise - 05/24/17 1021      Shoulder Exercises: Supine   Protraction  Right;15 reps;Weights    Protraction Weight (lbs)  2    Flexion  AROM;Right;15 reps      Shoulder Exercises: Seated   Flexion  AAROM;Both;15 reps cane    Other Seated Exercises  rows red tband x 15      Shoulder Exercises: Sidelying   External Rotation  Right;15 reps;AROM    ABduction  AAROM;Right;15 reps;Other (comment) assist needed to maintain elbow extension      Shoulder Exercises: Standing   Flexion  Right;15 reps counter to cabinet height    ABduction  Right;15 reps scaption; counter to 1st shelf cabinet height    ABduction Limitations  cues to decrease shoulder shrug      Shoulder Exercises: Pulleys   Flexion  2 minutes    ABduction  2 minutes scaption      Modalities   Modalities  Moist Heat      Moist Heat Therapy   Number Minutes Moist Heat  10 Minutes    Moist  Heat Location  Shoulder                  PT Long Term Goals - 05/15/17 1305      PT LONG TERM GOAL #1   Title  independent with HEP    Status  New    Target Date  06/26/17      PT LONG TERM GOAL #2   Title  improve Rt shoulder flexion and abduction AROM to at least 90 degrees for improved functional mobiltiy.    Status  New    Target Date  06/26/17      PT LONG TERM GOAL #3   Title  improve FOTO score to at least 55 (45% limited) for improved functional mobility    Status  New    Target Date  06/26/17      PT LONG TERM GOAL #4   Title  report 50% improvement in ADLs for improved function    Status  New    Target Date  06/26/17      PT LONG TERM GOAL #5   Title  report pain < 4/10 with activity for improved function    Status  New    Target Date  06/26/17            Plan - 05/24/17 1049    Clinical Impression Statement  Pt tolerated strengthening exercises well today with expected reports of fatigue and soreness following session.  Added heat today at pt's request.  Progressing well with PT.  Still having difficulty with active abduction first 10-15 degrees.    PT Treatment/Interventions  ADLs/Self Care Home Management;Cryotherapy;Moist Heat;Therapeutic exercise;Therapeutic activities;Functional mobility training;Ultrasound;Electrical Stimulation;Iontophoresis 4mg /ml Dexamethasone;Neuromuscular re-education;Patient/family education;Manual techniques;Vasopneumatic Device;Taping;Dry needling;Passive range of motion    PT Next Visit Plan  AROM OH reach, eccentric control    PT Home Exercise Plan  AAROM abduction with cane in standing, supine cane flexion,  sidelying ER, seated flexion with wand, row    Consulted and Agree with Plan of Care  Patient       Patient will benefit from skilled therapeutic intervention in order to improve the following deficits and impairments:  Postural dysfunction, Decreased range of motion, Decreased strength, Pain, Impaired UE  functional use  Visit Diagnosis: Acute pain of right shoulder  Abnormal posture  Muscle weakness (generalized)     Problem List Patient Active Problem List  Diagnosis Date Noted  . Chronic right shoulder pain 05/08/2017  . Acute pain of right shoulder 03/12/2017  . Allergic dermatitis 12/01/2016  . Chiggers 12/01/2016  . Infected insect bites of multiple sites 12/01/2016  . SVT (supraventricular tachycardia) ([place]) 07/09/2014  . Essential hypertension 07/06/2014  . CKD (chronic kidney disease) stage 3, GFR 30-59 ml/min (HCC) 06/04/2014  . Skin rash 04/08/2014  . Edema leg 03/30/2014  . Hypothyroidism 03/09/2014  . Chronic anticoagulation 01/23/2014  . Ejection fraction   . Atrial flutter ([place]) 01/03/2014  . Chest pressure 01/03/2014  . H/O Bell's palsy       Laureen Abrahams, PT, DPT 05/24/17 10:51 AM    [facility] [address] [place], [place], [address] Phone: [phone]   Fax:  [phone]  Name: [name] MRN: [id number] Date of Birth: [birth year]

## 2017-05-29 ENCOUNTER — Encounter: Payer: Self-pay | Admitting: Physical Therapy

## 2017-05-29 ENCOUNTER — Ambulatory Visit: Payer: Medicare Other | Admitting: Physical Therapy

## 2017-05-29 DIAGNOSIS — R293 Abnormal posture: Secondary | ICD-10-CM

## 2017-05-29 DIAGNOSIS — M25511 Pain in right shoulder: Secondary | ICD-10-CM

## 2017-05-29 DIAGNOSIS — M6281 Muscle weakness (generalized): Secondary | ICD-10-CM

## 2017-05-29 NOTE — Therapy (Signed)
[place], [place], [address] Phone: [phone]   Fax:  [phone]  Physical Therapy Treatment  Patient Details  Name: [name] MRN: [id number] Date of Birth: [birth year] Referring Provider: Dr. Jean Rosenthal   Encounter Date: 05/29/2017  PT End of Session - 05/29/17 1212    Visit Number  5    Number of Visits  12    Date for PT Re-Evaluation  06/26/17    Authorization Type  [employer]HC Medicare $20 copay    PT Start Time  1100    PT Stop Time  1155    PT Time Calculation (min)  55 min       Past Medical History:  Diagnosis Date  . Anemia   . Anxiety   . Arthritis    "a little bit qwhere; mainly in my neck & shoulders" (07/09/2014)  . Bilateral leg edema   . Breast cancer, left breast ([place])    S/P chemo & radiation  . Chronic bronchitis ([place])    "usually get it q yr"  . Colon cancer ([place])    a. s/p resection 2002.  . Family history of adverse reaction to anesthesia    "son has PONV"  . H/O Bell's palsy   . Hypertension   . Hypothyroidism   . Paroxysmal atrial flutter ([place])    a. Dx 12/2013 - spont conv to NSR, placed on amiodarone. Back in flutter later 12/2013. s/p DCCV 01/2014 but did not hold - amio discontinued at that time.  Marland Kitchen PONV (postoperative nausea and vomiting)     Past Surgical History:  Procedure Laterality Date  . ABDOMINAL HERNIA REPAIR  01/2007  . ABDOMINAL HYSTERECTOMY  1975  . APPENDECTOMY  1975  . ATRIAL FLUTTER ABLATION  07/09/2014  . ATRIAL FLUTTER ABLATION N/A 07/09/2014   Procedure: ATRIAL FLUTTER ABLATION;  Surgeon: Thompson Grayer, MD;  [facility] C[facility] LAB;  Service: Cardiovascular;  Laterality: N/A;  . BREAST BIOPSY Left 1998  . BREAST LUMPECTOMY Left 1998  . CARDIOVERSION N/A 02/27/2014   Procedure: CARDIOVERSION;  Surgeon: Carlena Bjornstad, MD;  Location: [facility];  Service: Cardiovascular;  Laterality: N/A;  . CATARACT EXTRACTION W/ INTRAOCULAR LENS   IMPLANT, BILATERAL Bilateral 2011  . COLECTOMY    . HERNIA REPAIR    . LOW ANTERIOR BOWEL RESECTION  2002    There were no vitals filed for this visit.  Subjective Assessment - 05/29/17 1105    Subjective  Woke up more achey today, maybe the weather. I have been very consistent with my exercises.    Currently in Pain?  Yes    Pain Score  2  6/10 with movement    Pain Location  Shoulder    Pain Orientation  Right    Aggravating Factors   movement    Pain Relieving Factors  heat, cream provided by MD         [facility] PT Assessment - 05/29/17 0001      AROM   Right Shoulder Flexion  145 Degrees with arm extended    Right Shoulder ABduction  110 Degrees    Right Shoulder Internal Rotation  -- [facility]    Right Shoulder External Rotation  -- needs to abduct with elbow fully flexed, then reach T-2                   [facility] Adult PT Treatment/Exercise - 05/29/17 0001      Shoulder Exercises: Seated  External Rotation  10 reps yellow    Internal Rotation  10 reps yellow      Shoulder Exercises: Sidelying   External Rotation  Right;15 reps;AROM    ABduction  10 reps;AROM needed assist for first 2 reps       Shoulder Exercises: Standing   External Rotation  10 reps yellow, HEP    Internal Rotation  10 reps yellow, HEP    Flexion  10 reps;AROM    ABduction  10 reps;AROM    Other Standing Exercises  cane pullovers bilateral x 10     Other Standing Exercises  standing UE ranger flexion, abduction, ER       Shoulder Exercises: Pulleys   Flexion  2 minutes             PT Education - 05/29/17 1146    Education provided  Yes    Education Details  HEP    Person(s) Educated  Patient    Methods  Explanation;Handout    Comprehension  Verbalized understanding;Returned demonstration          PT Long Term Goals - 05/15/17 1305      PT LONG TERM GOAL #1   Title  independent with HEP    Status  New    Target Date  06/26/17      PT LONG TERM GOAL #2   Title   improve Rt shoulder flexion and abduction AROM to at least 90 degrees for improved functional mobiltiy.    Status  New    Target Date  06/26/17      PT LONG TERM GOAL #3   Title  improve FOTO score to at least 55 (45% limited) for improved functional mobility    Status  New    Target Date  06/26/17      PT LONG TERM GOAL #4   Title  report 50% improvement in ADLs for improved function    Status  New    Target Date  06/26/17      PT LONG TERM GOAL #5   Title  report pain < 4/10 with activity for improved function    Status  New    Target Date  06/26/17            Plan - 05/29/17 1132    Clinical Impression Statement  Pt reports she is able to put her eye drops in and lift coffee cup with RUE. She demonstrated improved flexion and abduction AROM in standing starting at neutral. Used compensation to reach behind head.  Began yellow band ER/IR and added to HEP.     PT Next Visit Plan  AROM OH reach, eccentric control, review ER/IR with bands     PT Home Exercise Plan  AAROM abduction with cane in standing, supine cane flexion,  sidelying ER, seated flexion with wand, row    Consulted and Agree with Plan of Care  Patient       Patient will benefit from skilled therapeutic intervention in order to improve the following deficits and impairments:  Postural dysfunction, Decreased range of motion, Decreased strength, Pain, Impaired UE functional use  Visit Diagnosis: Acute pain of right shoulder  Abnormal posture  Muscle weakness (generalized)     Problem List Patient Active Problem List   Diagnosis Date Noted  . Chronic right shoulder pain 05/08/2017  . Acute pain of right shoulder 03/12/2017  . Allergic dermatitis 12/01/2016  . Chiggers 12/01/2016  . Infected insect bites of multiple sites 12/01/2016  .  SVT (supraventricular tachycardia) ([place]) 07/09/2014  . Essential hypertension 07/06/2014  . CKD (chronic kidney disease) stage 3, GFR 30-59 ml/min (HCC) 06/04/2014  .  Skin rash 04/08/2014  . Edema leg 03/30/2014  . Hypothyroidism 03/09/2014  . Chronic anticoagulation 01/23/2014  . Ejection fraction   . Atrial flutter ([place]) 01/03/2014  . Chest pressure 01/03/2014  . H/O Bell's palsy     Dorene Ar, [place] 05/29/2017, 12:18 PM  [facility] [address] [place], [place], [address] Phone: [phone]   Fax:  [phone]  Name: [name] MRN: [id number] Date of Birth: Mar 10, 1928

## 2017-05-31 ENCOUNTER — Encounter: Payer: Self-pay | Admitting: Physical Therapy

## 2017-05-31 ENCOUNTER — Ambulatory Visit: Payer: Medicare Other | Admitting: Physical Therapy

## 2017-05-31 DIAGNOSIS — M25511 Pain in right shoulder: Secondary | ICD-10-CM

## 2017-05-31 DIAGNOSIS — R293 Abnormal posture: Secondary | ICD-10-CM

## 2017-05-31 DIAGNOSIS — M6281 Muscle weakness (generalized): Secondary | ICD-10-CM

## 2017-05-31 NOTE — Therapy (Signed)
[place], [place], [address] Phone: [phone]   Fax:  [phone]  Physical Therapy Treatment  Patient Details  Name: [name] MRN: [id number] Date of Birth: [birth year] Referring Provider: Dr. Jean Rosenthal   Encounter Date: 05/31/2017  PT End of Session - 05/31/17 0926    Visit Number  6    Number of Visits  12    Date for PT Re-Evaluation  06/26/17    Authorization Type  [employer]HC Medicare $20 copay    PT Start Time  0845    PT Stop Time  0935    PT Time Calculation (min)  50 min       Past Medical History:  Diagnosis Date  . Anemia   . Anxiety   . Arthritis    "a little bit qwhere; mainly in my neck & shoulders" (07/09/2014)  . Bilateral leg edema   . Breast cancer, left breast ([place])    S/P chemo & radiation  . Chronic bronchitis ([place])    "usually get it q yr"  . Colon cancer ([place])    a. s/p resection 2002.  . Family history of adverse reaction to anesthesia    "son has PONV"  . H/O Bell's palsy   . Hypertension   . Hypothyroidism   . Paroxysmal atrial flutter ([place])    a. Dx 12/2013 - spont conv to NSR, placed on amiodarone. Back in flutter later 12/2013. s/p DCCV 01/2014 but did not hold - amio discontinued at that time.  Marland Kitchen PONV (postoperative nausea and vomiting)     Past Surgical History:  Procedure Laterality Date  . ABDOMINAL HERNIA REPAIR  01/2007  . ABDOMINAL HYSTERECTOMY  1975  . APPENDECTOMY  1975  . ATRIAL FLUTTER ABLATION  07/09/2014  . ATRIAL FLUTTER ABLATION N/A 07/09/2014   Procedure: ATRIAL FLUTTER ABLATION;  Surgeon: Thompson Grayer, MD;  Location: [facility] CATH LAB;  Service: Cardiovascular;  Laterality: N/A;  . BREAST BIOPSY Left 1998  . BREAST LUMPECTOMY Left 1998  . CARDIOVERSION N/A 02/27/2014   Procedure: CARDIOVERSION;  Surgeon: Carlena Bjornstad, MD;  Location: [facility];  Service: Cardiovascular;  Laterality: N/A;  . CATARACT EXTRACTION W/ INTRAOCULAR LENS   IMPLANT, BILATERAL Bilateral 2011  . COLECTOMY    . HERNIA REPAIR    . LOW ANTERIOR BOWEL RESECTION  2002    There were no vitals filed for this visit.  Subjective Assessment - 05/31/17 0904    Subjective  still achey because of the weather and because it's early     Currently in Pain?  Yes    Pain Score  2  6/10 with movement     Pain Location  Shoulder    Pain Orientation  Right    Pain Descriptors / Indicators  Aching         [facility] PT Assessment - 05/31/17 0001      AROM   Right Shoulder Flexion  145 Degrees    Right Shoulder ABduction  -- 145 scapation    Right Shoulder Internal Rotation  -- [facility]    Right Shoulder External Rotation  -- after several tries reaches T2 able to abduct and Er       PROM   Right/Left Shoulder  Right    Right Shoulder Flexion  -- WNL    Right Shoulder ABduction  -- WNL    Right Shoulder Internal Rotation  -- WNL    Right Shoulder External Rotation  -- WNL  [facility] Adult PT Treatment/Exercise - 05/31/17 0001      Shoulder Exercises: Supine   Protraction  10 reps;AAROM    Flexion  AROM;20 reps 10 reps flexed, 10 reps extended      Shoulder Exercises: Seated   External Rotation  15 reps yellow    Internal Rotation  15 reps yellow    Flexion  AAROM;Both;15 reps cane      Shoulder Exercises: Sidelying   External Rotation  Right;15 reps;AROM    ABduction  10 reps;AAROM needs assist to lft, hesitant to lower       Shoulder Exercises: Standing   Flexion  10 reps;AROM    ABduction  10 reps;AROM    Extension  20 reps red    Row  20 reps red    Other Standing Exercises  wall ladder x 10      Shoulder Exercises: Pulleys   Flexion  3 minutes      Moist Heat Therapy   Number Minutes Moist Heat  10 Minutes    Moist Heat Location  Shoulder                  PT Long Term Goals - 05/15/17 1305      PT LONG TERM GOAL #1   Title  independent with HEP    Status  New    Target Date  06/26/17      PT  LONG TERM GOAL #2   Title  improve Rt shoulder flexion and abduction AROM to at least 90 degrees for improved functional mobiltiy.    Status  New    Target Date  06/26/17      PT LONG TERM GOAL #3   Title  improve FOTO score to at least 55 (45% limited) for improved functional mobility    Status  New    Target Date  06/26/17      PT LONG TERM GOAL #4   Title  report 50% improvement in ADLs for improved function    Status  New    Target Date  06/26/17      PT LONG TERM GOAL #5   Title  report pain < 4/10 with activity for improved function    Status  New    Target Date  06/26/17            Plan - 05/31/17 0160    Clinical Impression Statement  Pt reports she is able to put her hearing aid in opposite ear now. Today, she can reach behind her head to T-2 with combined abduction and ER. She required compensation before today. Continued strengthening as tolerated. She reports some increased achiness this week she thinks is due to the weather. Progressing well.     PT Next Visit Plan  AROM OH reach, eccentric control, review ER/IR with bands     PT Home Exercise Plan  AAROM abduction with cane in standing, supine cane flexion,  sidelying ER, seated flexion with wand, row    Consulted and Agree with Plan of Care  Patient       Patient will benefit from skilled therapeutic intervention in order to improve the following deficits and impairments:  Postural dysfunction, Decreased range of motion, Decreased strength, Pain, Impaired UE functional use  Visit Diagnosis: Acute pain of right shoulder  Abnormal posture  Muscle weakness (generalized)     Problem List Patient Active Problem List   Diagnosis Date Noted  . Chronic right shoulder pain 05/08/2017  . Acute  pain of right shoulder 03/12/2017  . Allergic dermatitis 12/01/2016  . Chiggers 12/01/2016  . Infected insect bites of multiple sites 12/01/2016  . SVT (supraventricular tachycardia) ([place]) 07/09/2014  . Essential  hypertension 07/06/2014  . CKD (chronic kidney disease) stage 3, GFR 30-59 ml/min (HCC) 06/04/2014  . Skin rash 04/08/2014  . Edema leg 03/30/2014  . Hypothyroidism 03/09/2014  . Chronic anticoagulation 01/23/2014  . Ejection fraction   . Atrial flutter ([place]) 01/03/2014  . Chest pressure 01/03/2014  . H/O Bell's palsy     Dorene Ar, [place] 05/31/2017, 9:38 AM  [facility] [address] [place], [place], [address] Phone: [phone]   Fax:  [phone]  Name: [name] MRN: [id number] Date of Birth: Mar 04, 1928

## 2017-06-05 ENCOUNTER — Encounter: Payer: Self-pay | Admitting: Physical Therapy

## 2017-06-05 ENCOUNTER — Ambulatory Visit (INDEPENDENT_AMBULATORY_CARE_PROVIDER_SITE_OTHER): Payer: Medicare Other | Admitting: Orthopaedic Surgery

## 2017-06-05 ENCOUNTER — Ambulatory Visit: Payer: Medicare Other | Attending: Orthopaedic Surgery | Admitting: Physical Therapy

## 2017-06-05 ENCOUNTER — Encounter (INDEPENDENT_AMBULATORY_CARE_PROVIDER_SITE_OTHER): Payer: Self-pay | Admitting: Orthopaedic Surgery

## 2017-06-05 DIAGNOSIS — M6281 Muscle weakness (generalized): Secondary | ICD-10-CM

## 2017-06-05 DIAGNOSIS — G8929 Other chronic pain: Secondary | ICD-10-CM

## 2017-06-05 DIAGNOSIS — M25511 Pain in right shoulder: Secondary | ICD-10-CM | POA: Diagnosis not present

## 2017-06-05 DIAGNOSIS — R293 Abnormal posture: Secondary | ICD-10-CM | POA: Insufficient documentation

## 2017-06-05 MED ORDER — LIDOCAINE HCL 1 % IJ SOLN
3.0000 mL | INTRAMUSCULAR | Status: AC | PRN
  Administered 2017-06-05: 3 mL

## 2017-06-05 MED ORDER — METHYLPREDNISOLONE ACETATE 40 MG/ML IJ SUSP
40.0000 mg | INTRAMUSCULAR | Status: AC | PRN
  Administered 2017-06-05: 40 mg via INTRA_ARTICULAR

## 2017-06-05 NOTE — Progress Notes (Signed)
   Procedure Note  Patient: [name]             Date of Birth: [birth year]           MRN: [id number]             Visit Date: 06/05/2017 [name] returns today for follow-up of her right shoulder.  She states the injection 03/12/2017 helped for short period of time.  She feels therapy is typically help with her motion but she still having some pain in the shoulder.  She also has some questions about her bunion foot pain on the left foot.  Physical exam right shoulder: Positive impingement.  She has full forward flexion 5 out of 5 strength with external and internal rotation against resistance.  Left foot hallux valgus deformity.  She has tenderness over the bony eminence of the great toe.  Callus formation second third metatarsal head plantar aspect.  Procedures: Visit Diagnoses: Chronic right shoulder pain  Large Joint Inj: R subacromial bursa on 06/05/2017 9:10 AM Indications: pain Details: 22 G 1.5 in needle, superior approach  Arthrogram: No  Medications: 3 mL lidocaine 1 %; 40 mg methylPREDNISolone acetate 40 MG/ML Outcome: tolerated well, no immediate complications Procedure, treatment alternatives, risks and benefits explained, specific risks discussed. Consent was given by the patient. Immediately prior to procedure a time out was called to verify the correct patient, procedure, equipment, support staff and site/side marked as required. Patient was prepped and draped in the usual sterile fashion.    Plan: She will continue work with therapy on range of motion strengthening shoulder.  Explained to her that she can have injections in the shoulder soft and is every 3 months if needed.  In regards to her foot she will wear wider toe box shoe to avoid open back shoes.  Follow-up calluses down.  Questions were encouraged and answered at length today.

## 2017-06-05 NOTE — Therapy (Addendum)
[facility], [place], [address] Phone: [phone]   Fax:  [phone]  Physical Therapy Treatment/Discharge  Patient Details  Name: [name] MRN: [id number] Date of Birth: 1928-03-22 Referring Provider: Dr. Jean Rosenthal   Encounter Date: 06/05/2017  PT End of Session - 06/05/17 1159    Visit Number  7    Number of Visits  12    Date for PT Re-Evaluation  06/26/17    Authorization Type  [employer] Medicare $20 copay    PT Start Time  0093    PT Stop Time  1230    PT Time Calculation (min)  45 min       Past Medical History:  Diagnosis Date  . Anemia   . Anxiety   . Arthritis    "a little bit qwhere; mainly in my neck & shoulders" (07/09/2014)  . Bilateral leg edema   . Breast cancer, left breast ([place])    S/P chemo & radiation  . Chronic bronchitis ([place])    "usually get it q yr"  . Colon cancer ([place])    a. s/p resection 2002.  . Family history of adverse reaction to anesthesia    "son has PONV"  . H/O Bell's palsy   . Hypertension   . Hypothyroidism   . Paroxysmal atrial flutter ([facility])    a. Dx 12/2013 - spont conv to NSR, placed on amiodarone. Back in flutter later 12/2013. s/p DCCV 01/2014 but did not hold - amio discontinued at that time.  Marland Kitchen PONV (postoperative nausea and vomiting)     Past Surgical History:  Procedure Laterality Date  . ABDOMINAL HERNIA REPAIR  01/2007  . ABDOMINAL HYSTERECTOMY  1975  . APPENDECTOMY  1975  . ATRIAL FLUTTER ABLATION  07/09/2014  . ATRIAL FLUTTER ABLATION N/A 07/09/2014   Procedure: ATRIAL FLUTTER ABLATION;  Surgeon: Thompson Grayer, MD;  Location: [facility] CATH LAB;  Service: Cardiovascular;  Laterality: N/A;  . BREAST BIOPSY Left 1998  . BREAST LUMPECTOMY Left 1998  . CARDIOVERSION N/A 02/27/2014   Procedure: CARDIOVERSION;  Surgeon: Carlena Bjornstad, MD;  Location: [facility];  Service: Cardiovascular;  Laterality: N/A;  . CATARACT EXTRACTION W/  INTRAOCULAR LENS  IMPLANT, BILATERAL Bilateral 2011  . COLECTOMY    . HERNIA REPAIR    . LOW ANTERIOR BOWEL RESECTION  2002    There were no vitals filed for this visit.      [facility] PT Assessment - 06/05/17 0001      Observation/Other Assessments   Focus on Therapeutic Outcomes (FOTO)   33% limited improved from 55% limited on eval       AROM   Right Shoulder Flexion  150 Degrees    Right Shoulder ABduction  125 Degrees    Right Shoulder Internal Rotation  -- [facility]    Right Shoulder External Rotation  -- able to reach T-2 today.                  [facility] PT Treatment/Exercise - 06/05/17 0001      Shoulder Exercises: Supine   Flexion  AROM;20 reps 10 reps flexed, 10 reps extended      Shoulder Exercises: Seated   Flexion  AAROM;Both;15 reps cane      Shoulder Exercises: Sidelying   External Rotation  15 reps;AROM    ABduction  10 reps;AROM      Shoulder Exercises: Standing   External Rotation  10 reps    Internal  Rotation  10 reps    Flexion  10 reps;AROM    ABduction  10 reps;AROM    Extension  20 reps red    Row  20 reps red    Other Standing Exercises  standing UE ranger flexion, abduction, ER       Shoulder Exercises: Pulleys   Flexion  3 minutes                  PT Long Term Goals - 06/05/17 1212      PT LONG TERM GOAL #1   Title  independent with HEP    Status  Achieved      PT LONG TERM GOAL #2   Title  improve Rt shoulder flexion and abduction AROM to at least 90 degrees for improved functional mobiltiy.    Status  Achieved      PT LONG TERM GOAL #3   Title  improve FOTO score to at least 55 (45% limited) for improved functional mobility      PT LONG TERM GOAL #4   Title  report 50% improvement in ADLs for improved function    Baseline  80% improved    Status  Achieved      PT LONG TERM GOAL #5   Title  report pain < 4/10 with activity for improved function    Baseline  3/10 at most     Status  Achieved             Plan - 06/05/17 1215    Clinical Impression Statement  Pt arrives reporting recent visit with MD who was pleased with her progress. Mrs. Latorre is pleased with her current function and would like to discharge to HEP. She reports 80% improvement in pain. AROM much improved. All LTGS met.     PT Next Visit Plan  discharge to HEP    PT Home Exercise Plan  AAROM abduction with cane in standing, supine cane flexion,  sidelying ER, seated flexion with wand, row, yellow band IR and ER        Patient will benefit from skilled therapeutic intervention in order to improve the following deficits and impairments:  Postural dysfunction, Decreased range of motion, Decreased strength, Pain, Impaired UE functional use  Visit Diagnosis: Acute pain of right shoulder  Abnormal posture  Muscle weakness (generalized)     Problem List Patient Active Problem List   Diagnosis Date Noted  . Chronic right shoulder pain 05/08/2017  . Acute pain of right shoulder 03/12/2017  . Allergic dermatitis 12/01/2016  . Chiggers 12/01/2016  . Infected insect bites of multiple sites 12/01/2016  . SVT (supraventricular tachycardia) ([place]) 07/09/2014  . Essential hypertension 07/06/2014  . CKD (chronic kidney disease) stage 3, GFR 30-59 ml/min (HCC) 06/04/2014  . Skin rash 04/08/2014  . Edema leg 03/30/2014  . Hypothyroidism 03/09/2014  . Chronic anticoagulation 01/23/2014  . Ejection fraction   . Atrial flutter ([place]) 01/03/2014  . Chest pressure 01/03/2014  . H/O Bell's palsy     Dorene Ar, [place] 06/05/2017, 12:34 PM  [facility] [address] [place], [place], [address] Phone: [phone]   Fax:  [phone]  Name: CHAPEL SILVERTHORN MRN: [id number] Date of Birth: 26-Aug-1927       PHYSICAL THERAPY DISCHARGE SUMMARY  Visits from Start of Care: 7  Current functional level related to goals / functional outcomes: See above    Remaining deficits: See above   Education / Equipment: HEP  Plan: Patient agrees to discharge.  Patient goals were met. Patient is being discharged due to meeting the stated rehab goals.  ?????     Laureen Abrahams, PT, DPT 06/12/17 1:13 PM  [facility] [address] [place] [address]  [phone] (office) [phone] (fax)

## 2017-06-07 ENCOUNTER — Encounter: Payer: Medicare Other | Admitting: Physical Therapy

## 2018-01-28 NOTE — Progress Notes (Signed)
[facility] Note  01/29/2018     CHIEF COMPLAINT Patient presents for Retina Evaluation   HISTORY OF PRESENT ILLNESS: [name] is a 84 y.o. female who presents to the clinic today for:   HPI    Retina Evaluation    In both eyes.  Associated Symptoms Photophobia.  Negative for Flashes, Blind Spot, Floaters, Pain, Glare, Distortion, Redness, Trauma, Shoulder/Hip pain, Scalp Tenderness, Jaw Claudication, Weight Loss, Fatigue and Fever.  Context:  distance vision, mid-range vision, near vision and reading.  Treatments tried include surgery.  Response to treatment was significant improvement.  I, the attending physician,  performed the HPI with the patient and updated documentation appropriately.          Comments    Referral of [name] for retina eval. Patient states she has had dry eyes for a long time, but last few weeks (2-3wks) her eyes have feeling grandular and  sun light brothers her eyes. Pt reports she had cataract sx ou 2011 with significant improvement. Pt is using Systane PRN. Patient is taking Ocuvite QD       Last edited by Bernarda Caffey, MD on 01/29/2018  2:06 PM. (History)    Pt states she sees Dr. Gershon Crane on a routine basis; Pt states she was told by Dr. Gershon Crane that her "degeneration" is advancing;   Referring physician: [facility] Guys, [place], [place] [address]  HISTORICAL INFORMATION:   Selected notes from the MEDICAL RECORD NUMBER Referred by Dr. [facility] Guys for concern of non-exudative ARMD OU LEE: 09.17.19 Jake Samples) [BCVA: OD: 20/60 OS: 20/40] Ocular Hx-pseudo OU PMH-Bell's Palsy, HTN, anxiety, asthma    CURRENT MEDICATIONS: Current Outpatient Medications (Ophthalmic Drugs)  Medication Sig  . Polyethyl Glycol-Propyl Glycol (SYSTANE OP) Place 1 drop into both eyes at bedtime.   No current facility-administered medications for this visit.  (Ophthalmic Drugs)   Current Outpatient Medications  (Other)  Medication Sig  . acetaminophen (TYLENOL) 500 MG tablet Take 1,000 mg by mouth 2 (two) times daily. Mainly just two at night.  . Cholecalciferol (VITAMIN D PO) Take 1 tablet by mouth daily.  . diclofenac sodium (VOLTAREN) 1 % GEL Apply 2 g 4 (four) times daily topically.  . furosemide (LASIX) 40 MG tablet TAKE 1 TABLET (40 MG TOTAL) BY MOUTH 2 (TWO) TIMES DAILY.  Marland Kitchen levothyroxine (SYNTHROID, LEVOTHROID) 50 MCG tablet Take 50 mcg by mouth daily.  . metoprolol succinate (TOPROL-XL) 25 MG 24 hr tablet TAKE 1/2 TABLET BY MOUTH EVERY DAY (TAKE WITH A MEAL OR IMMEDIATELY FOLLOWING A MEAL)  . Multiple Vitamins-Minerals (OCUVITE EYE HEATLH GUMMIES PO) Take 2 tablets by mouth daily.  . potassium chloride SA (KLOR-CON M20) 20 MEQ tablet Take 2 tablets (40 mEq total) by mouth 2 (two) times daily.  Marland Kitchen HYDROcodone-acetaminophen (NORCO) 5-325 MG tablet Take 1 tablet by mouth every 6 (six) hours as needed for moderate pain or severe pain. (Patient not taking: Reported on 01/29/2018)   No current facility-administered medications for this visit.  (Other)      REVIEW OF SYSTEMS: ROS    Positive for: Eyes   Negative for: Constitutional, Gastrointestinal, Neurological, Skin, Genitourinary, Musculoskeletal, HENT, Endocrine, Cardiovascular, Respiratory, Psychiatric, Allergic/Imm, Heme/Lymph   Last edited by Zenovia Jordan, LPN on 96/06/9526  4:13 PM. (History)       ALLERGIES Allergies  Allergen Reactions  . Codeine Nausea And Vomiting  . Sulfa Antibiotics Nausea Only    PAST MEDICAL  HISTORY Past Medical History:  Diagnosis Date  . Anemia   . Anxiety   . Arthritis    "a little bit qwhere; mainly in my neck & shoulders" (07/09/2014)  . Bilateral leg edema   . Breast cancer, left breast ([place])    S/P chemo & radiation  . Chronic bronchitis ([place])    "usually get it q yr"  . Colon cancer ([place])    a. s/p resection 2002.  . Family history of adverse reaction to anesthesia    "son has PONV"   . H/O Bell's palsy   . Hypertension   . Hypothyroidism   . Paroxysmal atrial flutter ([place])    a. Dx 12/2013 - spont conv to NSR, placed on amiodarone. Back in flutter later 12/2013. s/p DCCV 01/2014 but did not hold - amio discontinued at that time.  Marland Kitchen PONV (postoperative nausea and vomiting)    Past Surgical History:  Procedure Laterality Date  . ABDOMINAL HERNIA REPAIR  01/2007  . ABDOMINAL HYSTERECTOMY  1975  . APPENDECTOMY  1975  . ATRIAL FLUTTER ABLATION  07/09/2014  . ATRIAL FLUTTER ABLATION N/A 07/09/2014   Procedure: ATRIAL FLUTTER ABLATION;  Surgeon: Thompson Grayer, MD;  Location: [facility] CATH LAB;  Service: Cardiovascular;  Laterality: N/A;  . BREAST BIOPSY Left 1998  . BREAST LUMPECTOMY Left 1998  . CARDIOVERSION N/A 02/27/2014   Procedure: CARDIOVERSION;  Surgeon: Carlena Bjornstad, MD;  Location: [facility] ENDOSCOPY;  Service: Cardiovascular;  Laterality: N/A;  . CATARACT EXTRACTION    . CATARACT EXTRACTION W/ INTRAOCULAR LENS  IMPLANT, BILATERAL Bilateral 2011  . COLECTOMY    . EYE SURGERY    . HERNIA REPAIR    . LOW ANTERIOR BOWEL RESECTION  2002    FAMILY HISTORY Family History  Problem Relation Age of Onset  . Diabetes Mother   . Cancer - Prostate Father     SOCIAL HISTORY Social History   Tobacco Use  . Smoking status: Former Smoker    Packs/day: 1.00    Years: 10.00    Pack years: 10.00    Types: Cigarettes  . Smokeless tobacco: Never Used  . Tobacco comment: "quit smoking in the 1960's"  Substance Use Topics  . Alcohol use: Yes    Comment: 07/09/2014 "might have a drink a couple times/yr"  . Drug use: No         OPHTHALMIC EXAM:  Base Eye Exam    Visual Acuity (Snellen - Linear)      Right Left   Dist cc 20/50 20/70   Dist ph cc 20/40 20/40   Correction:  Glasses       Tonometry (Tonopen, 1:33 PM)      Right Left   Pressure 18 16       Pupils      Dark Light Shape React APD   Right 4 3 Round Brisk None   Left 4 3 Round Brisk None       Visual  Fields (Counting fingers)      Left Right    Full Full       Extraocular Movement      Right Left    Full, Ortho Full, Ortho       Neuro/Psych    Oriented x3:  Yes   Mood/Affect:  Normal       Dilation    Both eyes:  1.0% Mydriacyl, 2.5% Phenylephrine @ 1:33 PM        Slit Lamp and Fundus Exam  Slit Lamp Exam      Right Left   Lids/Lashes Dermatochalasis - upper lid, Meibomian gland dysfunction Dermatochalasis - upper lid, Meibomian gland dysfunction   Conjunctiva/Sclera White and quiet White and quiet   Cornea Arcus, central K haze, Temporal Well healed cataract wounds, 1+ Punctate epithelial erosions Arcus, central K haze, Temporal Well healed cataract wounds, 1+ Punctate epithelial erosions   Anterior Chamber Deep and quiet Deep and quiet   Iris Round and dilated Round and dilated   Lens PC IOL in good position, 1+ non-central Posterior capsular opacification PC IOL in good position, 1-2+ non-central Posterior capsular opacification   Vitreous Vitreous syneresis Vitreous syneresis       Fundus Exam      Right Left   Disc Pink and Sharp Pink and Sharp   C/D Ratio 0.4 0.5   Macula Blunted foveal reflex, Retinal pigment epithelial mottling, Drusen, No heme or edema Blunted foveal reflex, Drusen, RPE mottling and clumping, No heme or edema   Vessels Mild Vascular attenuation, mildly Tortuous Mild Vascular attenuation, mildly Tortuous   Periphery Attached Attached, small nasal Choroidal nevus at 0930 about 3 DD from disc        Refraction    Wearing Rx      Sphere Cylinder Axis Add   Right [place] +1.00 017 +2.50   Left -0.25 +0.75 150 +2.50       Manifest Refraction      Sphere Cylinder Axis Dist VA   Right -1.50 +1.00 017 20/40++   Left -0.25 +0.75 160 20/50--          IMAGING AND PROCEDURES  Imaging and Procedures for @TODAY @  OCT, Retina - OU - Both Eyes       Right Eye Quality was good. Central Foveal Thickness: 262. Progression has no prior data.  Findings include normal foveal contour, no IRF, no SRF, retinal drusen .   Left Eye Quality was good. Central Foveal Thickness: 272. Progression has no prior data. Findings include normal foveal contour, no IRF, no SRF, retinal drusen .   Notes *Images captured and stored on drive  Diagnosis / Impression:  Non-Exudative ARMD OU  Clinical management:  See below  Abbreviations: NFP - Normal foveal profile. CME - cystoid macular edema. PED - pigment epithelial detachment. IRF - intraretinal fluid. SRF - subretinal fluid. EZ - ellipsoid zone. ERM - epiretinal membrane. ORA - outer retinal atrophy. ORT - outer retinal tubulation. SRHM - subretinal hyper-reflective material                  ASSESSMENT/PLAN:    ICD-10-CM   1. Retinal edema H35.81 OCT, Retina - OU - Both Eyes  2. Early dry stage nonexudative age-related macular degeneration of both eyes H35.3131   3. Choroidal nevus of left eye D31.32   4. Essential hypertension I10   5. Hypertensive retinopathy of both eyes H35.033   6. Pseudophakia of both eyes Z96.1   7. Dry eyes H04.123     1. Age related macular degeneration, non-exudative, both eyes  - early stage OU  - The incidence, anatomy, and pathology of dry AMD, risk of progression, and the AREDS and AREDS 2 study including smoking risks discussed with patient.  - Recommend amsler grid monitoring  - f/u 6 months, sooner prn  2. No retinal edema on exam or OCT  3. Choroidal nevus OS-  - small flat pigmented lesion nasal to disc - no suspicious features -- no org pigment or  SRF - monitor  4. Hypertensive retinopathy OU - discussed importance of tight BP control - monitor  5. Pseudophakia OU  - s/p CE/IOL OU by expert surgeon, D.r Gershon Crane  - beautiful surgeries, doing well  - monitor  6. Dry eyes OU - suspect visual symptoms mostly related to intermittent corneal dryness / MGD - recommend artificial tears and lubricating ointment as  needed   Ophthalmic Meds Ordered this visit:  No orders of the defined types were placed in this encounter.      Return in about 6 months (around 07/31/2018) for F/U Non-Exu AMD OU, DFE, OCT.  There are no Patient Instructions on file for this visit.   Explained the diagnoses, plan, and follow up with the patient and they expressed understanding.  Patient expressed understanding of the importance of proper follow up care.   This document serves as a record of services personally performed by Gardiner Sleeper, MD, PhD. It was created on their behalf by Ernest Mallick, OA, an ophthalmic assistant. The creation of this record is the provider's dictation and/or activities during the visit.    Electronically signed by: Ernest Mallick, OA  09.30.19 12:00 AM   This document serves as a record of services personally performed by Gardiner Sleeper, MD, PhD. It was created on their behalf by Catha Brow, [place], a certified ophthalmic assistant. The creation of this record is the provider's dictation and/or activities during the visit.  Electronically signed by: Catha Brow, COA  10.01.19 12:00 AM   Gardiner Sleeper, M.D., Ph.D. Diseases & Surgery of the Retina and Vitreous [facility]   I have reviewed the above documentation for accuracy and completeness, and I agree with the above. Gardiner Sleeper, M.D., Ph.D. 01/30/18 12:00 AM     Abbreviations: M myopia (nearsighted); A astigmatism; H hyperopia (farsighted); P presbyopia; Mrx spectacle prescription;  CTL contact lenses; OD right eye; OS left eye; OU both eyes  XT exotropia; ET esotropia; PEK punctate epithelial keratitis; PEE punctate epithelial erosions; DES dry eye syndrome; MGD meibomian gland dysfunction; ATs artificial tears; PFAT's preservative free artificial tears; [place] nuclear sclerotic cataract; PSC posterior subcapsular cataract; ERM epi-retinal membrane; PVD posterior vitreous detachment; RD retinal  detachment; DM diabetes mellitus; DR diabetic retinopathy; NPDR non-proliferative diabetic retinopathy; PDR proliferative diabetic retinopathy; CSME clinically significant macular edema; DME diabetic macular edema; dbh dot blot hemorrhages; CWS cotton wool spot; POAG primary open angle glaucoma; C/D cup-to-disc ratio; HVF humphrey visual field; GVF goldmann visual field; OCT optical coherence tomography; IOP intraocular pressure; BRVO Branch retinal vein occlusion; CRVO central retinal vein occlusion; CRAO central retinal artery occlusion; BRAO branch retinal artery occlusion; RT retinal tear; SB scleral buckle; PPV pars plana vitrectomy; VH Vitreous hemorrhage; PRP panretinal laser photocoagulation; IVK intravitreal kenalog; VMT vitreomacular traction; MH Macular hole;  NVD neovascularization of the disc; NVE neovascularization elsewhere; AREDS age related eye disease study; ARMD age related macular degeneration; POAG primary open angle glaucoma; EBMD epithelial/anterior basement membrane dystrophy; ACIOL anterior chamber intraocular lens; IOL intraocular lens; PCIOL posterior chamber intraocular lens; Phaco/IOL phacoemulsification with intraocular lens placement; [place] photorefractive keratectomy; LASIK laser assisted in situ keratomileusis; HTN hypertension; DM diabetes mellitus; COPD chronic obstructive pulmonary disease

## 2018-01-29 ENCOUNTER — Ambulatory Visit (INDEPENDENT_AMBULATORY_CARE_PROVIDER_SITE_OTHER): Payer: Medicare Other | Admitting: Ophthalmology

## 2018-01-29 ENCOUNTER — Encounter (INDEPENDENT_AMBULATORY_CARE_PROVIDER_SITE_OTHER): Payer: Self-pay | Admitting: Ophthalmology

## 2018-01-29 DIAGNOSIS — H353131 Nonexudative age-related macular degeneration, bilateral, early dry stage: Secondary | ICD-10-CM | POA: Diagnosis not present

## 2018-01-29 DIAGNOSIS — I10 Essential (primary) hypertension: Secondary | ICD-10-CM | POA: Diagnosis not present

## 2018-01-29 DIAGNOSIS — H04123 Dry eye syndrome of bilateral lacrimal glands: Secondary | ICD-10-CM

## 2018-01-29 DIAGNOSIS — Z961 Presence of intraocular lens: Secondary | ICD-10-CM

## 2018-01-29 DIAGNOSIS — H3581 Retinal edema: Secondary | ICD-10-CM

## 2018-01-29 DIAGNOSIS — H35033 Hypertensive retinopathy, bilateral: Secondary | ICD-10-CM

## 2018-01-29 DIAGNOSIS — D3132 Benign neoplasm of left choroid: Secondary | ICD-10-CM

## 2018-01-30 ENCOUNTER — Encounter (INDEPENDENT_AMBULATORY_CARE_PROVIDER_SITE_OTHER): Payer: Self-pay | Admitting: Ophthalmology

## 2019-08-04 DIAGNOSIS — D225 Melanocytic nevi of trunk: Secondary | ICD-10-CM | POA: Diagnosis not present

## 2019-08-04 DIAGNOSIS — L570 Actinic keratosis: Secondary | ICD-10-CM | POA: Diagnosis not present

## 2019-08-04 DIAGNOSIS — L821 Other seborrheic keratosis: Secondary | ICD-10-CM | POA: Diagnosis not present

## 2019-08-04 DIAGNOSIS — Z85828 Personal history of other malignant neoplasm of skin: Secondary | ICD-10-CM | POA: Diagnosis not present

## 2019-08-04 DIAGNOSIS — D2261 Melanocytic nevi of right upper limb, including shoulder: Secondary | ICD-10-CM | POA: Diagnosis not present

## 2019-08-11 DIAGNOSIS — D3132 Benign neoplasm of left choroid: Secondary | ICD-10-CM | POA: Diagnosis not present

## 2019-08-11 DIAGNOSIS — H52203 Unspecified astigmatism, bilateral: Secondary | ICD-10-CM | POA: Diagnosis not present

## 2019-08-11 DIAGNOSIS — H353132 Nonexudative age-related macular degeneration, bilateral, intermediate dry stage: Secondary | ICD-10-CM | POA: Diagnosis not present

## 2019-08-11 DIAGNOSIS — H524 Presbyopia: Secondary | ICD-10-CM | POA: Diagnosis not present

## 2019-08-11 DIAGNOSIS — Z961 Presence of intraocular lens: Secondary | ICD-10-CM | POA: Diagnosis not present

## 2019-09-21 ENCOUNTER — Encounter (HOSPITAL_COMMUNITY): Payer: Self-pay

## 2019-09-21 ENCOUNTER — Emergency Department (HOSPITAL_COMMUNITY)
Admission: EM | Admit: 2019-09-21 | Discharge: 2019-09-22 | Disposition: A | Discharge status: Home / Self Care | Payer: Medicare PPO | Attending: Emergency Medicine | Admitting: Emergency Medicine

## 2019-09-21 ENCOUNTER — Other Ambulatory Visit: Payer: Self-pay

## 2019-09-21 DIAGNOSIS — Z79899 Other long term (current) drug therapy: Secondary | ICD-10-CM | POA: Diagnosis not present

## 2019-09-21 DIAGNOSIS — R21 Rash and other nonspecific skin eruption: Secondary | ICD-10-CM | POA: Diagnosis present

## 2019-09-21 DIAGNOSIS — I129 Hypertensive chronic kidney disease with stage 1 through stage 4 chronic kidney disease, or unspecified chronic kidney disease: Secondary | ICD-10-CM | POA: Diagnosis not present

## 2019-09-21 DIAGNOSIS — L509 Urticaria, unspecified: Secondary | ICD-10-CM | POA: Diagnosis not present

## 2019-09-21 DIAGNOSIS — N183 Chronic kidney disease, stage 3 unspecified: Secondary | ICD-10-CM | POA: Diagnosis not present

## 2019-09-21 MED ORDER — PREDNISOLONE 15 MG/5ML PO SOLN: 60.0000 mg | Freq: Every day | ORAL | 0 refills | Status: AC

## 2019-09-21 MED ORDER — PREDNISOLONE SODIUM PHOSPHATE 15 MG/5ML PO SOLN
60.0000 mg | Freq: Once | ORAL | Status: AC
  Administered 2019-09-21: 60 mg via ORAL
  Filled 2019-09-21: qty 4

## 2019-09-21 MED ORDER — DIPHENHYDRAMINE HCL 12.5 MG/5ML PO SYRP: 25.0000 mg | ORAL_SOLUTION | ORAL | 0 refills | Status: DC | PRN

## 2019-09-21 MED ORDER — DIPHENHYDRAMINE HCL 12.5 MG/5ML PO ELIX
25.0000 mg | ORAL_SOLUTION | Freq: Once | ORAL | Status: AC
  Administered 2019-09-21: 25 mg via ORAL
  Filled 2019-09-21: qty 10

## 2019-09-21 NOTE — ED Triage Notes (Addendum)
Arrived POV from home. Patient reports rash that has been spreading over past couple of days. Patient has rash with redness and mild swelling on back, both arms torso, inside of left and thigh. Patient says she has been working in her garden and may have come in contact with something; denies using any new detergents, soaps, or eating anything different. Denies fever or chills, denies [facility]

## 2019-09-21 NOTE — ED Provider Notes (Signed)
[facility] DEPT Provider Note   CSN: [id number] Arrival date & time: 09/21/19  2113     History Chief Complaint  Patient presents with  . Rash    [name] is a 86 y.o. female.  The history is provided by the patient and a relative.  Rash Location:  Full body Quality: itchiness   Severity:  Moderate Onset quality:  Gradual Duration:  1 day Timing:  Constant Progression:  Worsening Chronicity:  New Relieved by:  Antihistamines Worsened by:  Nothing Associated symptoms: no fever, no shortness of breath, no throat swelling, no tongue swelling and not vomiting   Patient presents for rash.  Patient reports he was working in garden yesterday over the past day she has had rash to her full body.  No tongue or lip swelling.  No shortness of breath No tick bites.  No insect bites.  No new medicines.  No recent vaccines.  No other new exposures     Past Medical History:  Diagnosis Date  . Anemia   . Anxiety   . Arthritis    "a little bit qwhere; mainly in my neck & shoulders" (07/09/2014)  . Bilateral leg edema   . Breast cancer, left breast ([place])    S/P chemo & radiation  . Chronic bronchitis ([place])    "usually get it q yr"  . Colon cancer ([place])    a. s/p resection 2002.  . Family history of adverse reaction to anesthesia    "son has PONV"  . H/O Bell's palsy   . Hypertension   . Hypothyroidism   . Paroxysmal atrial flutter ([place])    a. Dx 12/2013 - spont conv to NSR, placed on amiodarone. Back in flutter later 12/2013. s/p DCCV 01/2014 but did not hold - amio discontinued at that time.  Marland Kitchen PONV (postoperative nausea and vomiting)     Patient Active Problem List   Diagnosis Date Noted  . Chronic right shoulder pain 05/08/2017  . Acute pain of right shoulder 03/12/2017  . Allergic dermatitis 12/01/2016  . Chiggers 12/01/2016  . Infected insect bites of multiple sites 12/01/2016  . SVT (supraventricular tachycardia) ([place]) 07/09/2014   . Essential hypertension 07/06/2014  . CKD (chronic kidney disease) stage 3, GFR 30-59 ml/min 06/04/2014  . Skin rash 04/08/2014  . Edema leg 03/30/2014  . Hypothyroidism 03/09/2014  . Chronic anticoagulation 01/23/2014  . Ejection fraction   . Atrial flutter ([place]) 01/03/2014  . Chest pressure 01/03/2014  . H/O Bell's palsy     Past Surgical History:  Procedure Laterality Date  . ABDOMINAL HERNIA REPAIR  01/2007  . ABDOMINAL HYSTERECTOMY  1975  . APPENDECTOMY  1975  . ATRIAL FLUTTER ABLATION  07/09/2014  . ATRIAL FLUTTER ABLATION N/A 07/09/2014   Procedure: ATRIAL FLUTTER ABLATION;  Surgeon: Thompson Grayer, MD;  Location: [facility] CATH LAB;  Service: Cardiovascular;  Laterality: N/A;  . BREAST BIOPSY Left 1998  . BREAST LUMPECTOMY Left 1998  . CARDIOVERSION N/A 02/27/2014   Procedure: CARDIOVERSION;  Surgeon: Carlena Bjornstad, MD;  Location: [facility] ENDOSCOPY;  Service: Cardiovascular;  Laterality: N/A;  . CATARACT EXTRACTION    . CATARACT EXTRACTION W/ INTRAOCULAR LENS  IMPLANT, BILATERAL Bilateral 2011  . COLECTOMY    . EYE SURGERY    . HERNIA REPAIR    . LOW ANTERIOR BOWEL RESECTION  2002     OB History   No obstetric history on file.     Family History  Problem Relation Age  of Onset  . Diabetes Mother   . Cancer - Prostate Father     Social History   Tobacco Use  . Smoking status: Former Smoker    Packs/day: 1.00    Years: 10.00    Pack years: 10.00    Types: Cigarettes  . Smokeless tobacco: Never Used  . Tobacco comment: "quit smoking in the 1960's"  Substance Use Topics  . Alcohol use: Yes    Comment: 07/09/2014 "might have a drink a couple times/yr"  . Drug use: No    Home Medications Prior to Admission medications   Medication Sig Start Date End Date Taking? Authorizing Provider  acetaminophen (TYLENOL) 500 MG tablet Take 1,000 mg by mouth 2 (two) times daily. Mainly just two at night.    [provider]  Cholecalciferol (VITAMIN D PO) Take 1 tablet by  mouth daily.    [provider]  diclofenac sodium (VOLTAREN) 1 % GEL Apply 2 g 4 (four) times daily topically. 03/12/17   Mcarthur Rossetti, MD  diphenhydrAMINE (BENYLIN) 12.5 MG/5ML syrup Take 10 mLs (25 mg total) by mouth every 4 (four) hours as needed for itching or allergies. 09/21/19   Ripley Fraise, MD  furosemide (LASIX) 40 MG tablet TAKE 1 TABLET (40 MG TOTAL) BY MOUTH 2 (TWO) TIMES DAILY. 04/26/16   Jerline Pain, MD  levothyroxine (SYNTHROID, LEVOTHROID) 50 MCG tablet Take 50 mcg by mouth daily. 03/16/14   [provider]  metoprolol succinate (TOPROL-XL) 25 MG 24 hr tablet TAKE 1/2 TABLET BY MOUTH EVERY DAY (TAKE WITH A MEAL OR IMMEDIATELY FOLLOWING A MEAL) 08/25/15   Jerline Pain, MD  Multiple Vitamins-Minerals (OCUVITE EYE HEATLH GUMMIES PO) Take 2 tablets by mouth daily.    [provider]  Polyethyl Glycol-Propyl Glycol (SYSTANE OP) Place 1 drop into both eyes at bedtime.    [provider]  potassium chloride SA (KLOR-CON M20) 20 MEQ tablet Take 2 tablets (40 mEq total) by mouth 2 (two) times daily. 04/28/15   Jerline Pain, MD  prednisoLONE (PRELONE) 15 MG/5ML SOLN Take 20 mLs (60 mg total) by mouth daily before breakfast for 5 days. 09/21/19 09/26/19  Ripley Fraise, MD    Allergies    Codeine and Sulfa antibiotics  Review of Systems   Review of Systems  Constitutional: Negative for fever.  Respiratory: Negative for shortness of breath.   Gastrointestinal: Negative for vomiting.  Skin: Positive for rash.  All other systems reviewed and are negative.   Physical Exam Updated Vital Signs BP 114/63   Pulse 80   Temp 97.6 F (36.4 C)   Resp 18   Ht 1.666 m (5' 5.6")   Wt 80.7 kg   SpO2 99%   BMI 29.08 kg/m   Physical Exam CONSTITUTIONAL: Elderly, no acute distress, hard of hearing HEAD: Normocephalic/atraumatic EYES: EOMI/PERRL no conjunctival lesion ENMT: Mucous membranes moist, no angioedema, no oral  lesions NECK: supple no meningeal signs SPINE/BACK:entire spine nontender CV: S1/S2 noted, no murmurs/rubs/gallops noted LUNGS: Lungs are clear to auscultation bilaterally, no apparent distress ABDOMEN: soft, nontender NEURO: Pt is awake/alert/appropriate, moves all extremitiesx4.  No facial droop.  EXTREMITIES: pulses normal/equal, full ROM, urticaria noted throughout upper extremities, chest, abdomen. SKIN: warm, color normal, no rash to palms or soles. PSYCH: no abnormalities of mood noted, alert and oriented to situation  ED Results / Procedures / Treatments   Labs (all labs ordered are listed, but only abnormal results are displayed) Labs Reviewed - No data  to display  EKG None  Radiology No results found.  Procedures Procedures (including critical care time)  Medications Ordered in ED Medications  prednisoLONE (ORAPRED) 15 MG/5ML solution 60 mg (60 mg Oral Given 09/21/19 2359)  diphenhydrAMINE (BENADRYL) 12.5 MG/5ML elixir 25 mg (25 mg Oral Given 09/21/19 2357)    ED Course  I have reviewed the triage vital signs and the nursing notes.     MDM Rules/Calculators/A&P                      Patient with urticaria.  She is well-appearing.  No angioedema.  No wheezing.  Will start on steroids and Benadryl.  Advise follow-up with PCP in 3 to 5 days if no improvement.  Discuss strict return precautions with patient and her daughter  Final Clinical Impression(s) / ED Diagnoses Final diagnoses:  Hives    Rx / DC Orders ED Discharge Orders         Ordered    prednisoLONE (PRELONE) 15 MG/5ML SOLN  Daily before breakfast     09/21/19 2338    diphenhydrAMINE (BENYLIN) 12.5 MG/5ML syrup  Every 4 hours PRN     09/21/19 2338           Ripley Fraise, MD 09/22/19 0000

## 2019-10-28 DIAGNOSIS — Z0000 Encounter for general adult medical examination without abnormal findings: Secondary | ICD-10-CM | POA: Diagnosis not present

## 2019-10-28 DIAGNOSIS — R7302 Impaired glucose tolerance (oral): Secondary | ICD-10-CM | POA: Diagnosis not present

## 2019-10-28 DIAGNOSIS — E559 Vitamin D deficiency, unspecified: Secondary | ICD-10-CM | POA: Diagnosis not present

## 2019-10-28 DIAGNOSIS — M859 Disorder of bone density and structure, unspecified: Secondary | ICD-10-CM | POA: Diagnosis not present

## 2019-10-28 DIAGNOSIS — N1831 Chronic kidney disease, stage 3a: Secondary | ICD-10-CM | POA: Diagnosis not present

## 2019-10-28 DIAGNOSIS — E038 Other specified hypothyroidism: Secondary | ICD-10-CM | POA: Diagnosis not present

## 2019-11-04 DIAGNOSIS — I4891 Unspecified atrial fibrillation: Secondary | ICD-10-CM | POA: Diagnosis not present

## 2019-11-04 DIAGNOSIS — M1990 Unspecified osteoarthritis, unspecified site: Secondary | ICD-10-CM | POA: Diagnosis not present

## 2019-11-04 DIAGNOSIS — Z1331 Encounter for screening for depression: Secondary | ICD-10-CM | POA: Diagnosis not present

## 2019-11-04 DIAGNOSIS — E039 Hypothyroidism, unspecified: Secondary | ICD-10-CM | POA: Diagnosis not present

## 2019-11-04 DIAGNOSIS — D692 Other nonthrombocytopenic purpura: Secondary | ICD-10-CM | POA: Diagnosis not present

## 2019-11-04 DIAGNOSIS — I509 Heart failure, unspecified: Secondary | ICD-10-CM | POA: Diagnosis not present

## 2019-11-04 DIAGNOSIS — Z853 Personal history of malignant neoplasm of breast: Secondary | ICD-10-CM | POA: Diagnosis not present

## 2019-11-04 DIAGNOSIS — Z0000 Encounter for general adult medical examination without abnormal findings: Secondary | ICD-10-CM | POA: Diagnosis not present

## 2019-11-04 DIAGNOSIS — N1831 Chronic kidney disease, stage 3a: Secondary | ICD-10-CM | POA: Diagnosis not present

## 2019-11-04 DIAGNOSIS — Z1212 Encounter for screening for malignant neoplasm of rectum: Secondary | ICD-10-CM | POA: Diagnosis not present

## 2019-11-04 DIAGNOSIS — R82998 Other abnormal findings in urine: Secondary | ICD-10-CM | POA: Diagnosis not present

## 2019-11-04 DIAGNOSIS — I130 Hypertensive heart and chronic kidney disease with heart failure and stage 1 through stage 4 chronic kidney disease, or unspecified chronic kidney disease: Secondary | ICD-10-CM | POA: Diagnosis not present

## 2019-11-10 DIAGNOSIS — J069 Acute upper respiratory infection, unspecified: Secondary | ICD-10-CM | POA: Diagnosis not present

## 2019-11-10 DIAGNOSIS — N1831 Chronic kidney disease, stage 3a: Secondary | ICD-10-CM | POA: Diagnosis not present

## 2019-11-10 DIAGNOSIS — I509 Heart failure, unspecified: Secondary | ICD-10-CM | POA: Diagnosis not present

## 2019-11-10 DIAGNOSIS — I130 Hypertensive heart and chronic kidney disease with heart failure and stage 1 through stage 4 chronic kidney disease, or unspecified chronic kidney disease: Secondary | ICD-10-CM | POA: Diagnosis not present

## 2019-11-10 DIAGNOSIS — R05 Cough: Secondary | ICD-10-CM | POA: Diagnosis not present

## 2019-12-11 DIAGNOSIS — N6459 Other signs and symptoms in breast: Secondary | ICD-10-CM | POA: Diagnosis not present

## 2019-12-11 DIAGNOSIS — Z853 Personal history of malignant neoplasm of breast: Secondary | ICD-10-CM | POA: Diagnosis not present

## 2019-12-11 DIAGNOSIS — R922 Inconclusive mammogram: Secondary | ICD-10-CM | POA: Diagnosis not present

## 2019-12-24 DIAGNOSIS — C50812 Malignant neoplasm of overlapping sites of left female breast: Secondary | ICD-10-CM | POA: Diagnosis not present

## 2019-12-24 DIAGNOSIS — N6325 Unspecified lump in the left breast, overlapping quadrants: Secondary | ICD-10-CM | POA: Diagnosis not present

## 2019-12-29 ENCOUNTER — Other Ambulatory Visit: Payer: Self-pay | Admitting: *Deleted

## 2019-12-29 ENCOUNTER — Encounter: Payer: Self-pay | Admitting: *Deleted

## 2019-12-29 DIAGNOSIS — Z170 Estrogen receptor positive status [ER+]: Secondary | ICD-10-CM

## 2019-12-29 DIAGNOSIS — C50412 Malignant neoplasm of upper-outer quadrant of left female breast: Secondary | ICD-10-CM | POA: Insufficient documentation

## 2019-12-30 NOTE — Progress Notes (Signed)
[facility] NOTE  Patient Care Team: Tisovec, Fransico Him, MD as PCP - General (Internal Medicine) Rockwell Germany, RN as Oncology Nurse Navigator Mauro Kaufmann, RN as Oncology Nurse Navigator Alphonsa Overall, MD as Consulting Physician (General Surgery) Nicholas Lose, MD as Consulting Physician (Hematology and Oncology) Kyung Rudd, MD as Consulting Physician (Radiation Oncology)  CHIEF COMPLAINTS/PURPOSE OF CONSULTATION:  Newly diagnosed breast cancer  HISTORY OF PRESENTING ILLNESS:  [name] 86 y.o. female is here because of recent diagnosis of recurrent left breast cancer. Patient noted enlargement of the left breast. Mammogram on 12/11/19 showed a 3.3cm left breast mass at the 12 o'clock position. She has a history of left breast cancer in 1998 for which she underwent a lumpectomy and radiation. She presents to the clinic today for initial evaluation and discussion of treatment options.   I reviewed her records extensively and collaborated the history with the patient.  SUMMARY OF ONCOLOGIC HISTORY: Oncology History  Malignant neoplasm of upper-outer quadrant of left breast in female, estrogen receptor positive ([place])  1998 Cancer Diagnosis   History of left breast cancer in 1998 for which she underwent a lumpectomy and radiation   12/29/2019 Initial Diagnosis   Patient noted enlargement of the left breast. Mammogram showed a 3.3cm left breast mass at the 12 o'clock position.    12/31/2019 Cancer Staging   Staging form: Breast, AJCC 8th Edition - Clinical stage from 12/31/2019: Stage IB (cT2, cN0, cM0, G2, ER+, PR+, HER2-) - Signed by Nicholas Lose, MD on 12/31/2019     MEDICAL HISTORY:  Past Medical History:  Diagnosis Date   Anemia    Anxiety    Arthritis    "a little bit qwhere; mainly in my neck & shoulders" (07/09/2014)   Bilateral leg edema    Breast cancer ([facility])    Breast cancer, left breast ([facility])    S/P chemo & radiation   Chronic  bronchitis ([place])    "usually get it q yr"   Colon cancer ([place])    a. s/p resection 2002.   Family history of adverse reaction to anesthesia    "son has PONV"   H/O Bell's palsy    Hypertension    Hypothyroidism    Paroxysmal atrial flutter ([place])    a. Dx 12/2013 - spont conv to NSR, placed on amiodarone. Back in flutter later 12/2013. s/p DCCV 01/2014 but did not hold - amio discontinued at that time.   PONV (postoperative nausea and vomiting)     SURGICAL HISTORY: Past Surgical History:  Procedure Laterality Date   ABDOMINAL HERNIA REPAIR  01/2007   ABDOMINAL HYSTERECTOMY  1975   APPENDECTOMY  1975   ATRIAL FLUTTER ABLATION  07/09/2014   ATRIAL FLUTTER ABLATION N/A 07/09/2014   Procedure: ATRIAL FLUTTER ABLATION;  Surgeon: Thompson Grayer, MD;  Location[facility] CATH LAB;  Service: Cardiovascular;  Laterality: N/A;   BREAST BIOPSY Left 1998   BREAST LUMPECTOMY Left 1998   CARDIOVERSION N/A 02/27/2014   Procedure: CARDIOVERSION;  Surgeon: Carlena Bjornstad, MD;  Location: [facility];  Service: Cardiovascular;  Laterality: N/A;   CATARACT EXTRACTION     CATARACT EXTRACTION W/ INTRAOCULAR LENS  IMPLANT, BILATERAL Bilateral 2011   COLECTOMY     EYE SURGERY     HERNIA REPAIR     LOW ANTERIOR BOWEL RESECTION  2002    SOCIAL HISTORY: Social History   Socioeconomic History   Marital status: Widowed    Spouse name: Not on  file   Number of children: Not on file   Years of education: Not on file   Highest education level: Not on file  Occupational History   Not on file  Tobacco Use   Smoking status: Former Smoker    Packs/day: 1.00    Years: 10.00    Pack years: 10.00    Types: Cigarettes   Smokeless tobacco: Never Used   Tobacco comment: "quit smoking in the 1960's"  Vaping Use   Vaping Use: Never used  Substance and Sexual Activity   Alcohol use: Yes    Comment: 07/09/2014 "might have a drink a couple times/yr"   Drug use: No   Sexual activity:  Not on file  Other Topics Concern   Not on file  Social History Narrative   Not on file   Social Determinants of Health   Financial Resource Strain: Low Risk    Difficulty of Paying Living Expenses: Not very hard  Food Insecurity: No Food Insecurity   Worried About Running Out of Food in the Last Year: Never true   [place] in the Last Year: Never true  Transportation Needs: No Transportation Needs   Lack of Transportation (Medical): No   Lack of Transportation (Non-Medical): No  Physical Activity:    Days of Exercise per Week: Not on file   Minutes of Exercise per Session: Not on file  Stress:    Feeling of Stress : Not on file  Social Connections:    Frequency of Communication with Friends and Family: Not on file   Frequency of Social Gatherings with Friends and Family: Not on file   Attends Religious Services: Not on file   Active Member of Clubs or Organizations: Not on file   Attends Archivist Meetings: Not on file   Marital Status: Not on file  Intimate Partner Violence:    Fear of Current or Ex-Partner: Not on file   Emotionally Abused: Not on file   Physically Abused: Not on file   Sexually Abused: Not on file    FAMILY HISTORY: Family History  Problem Relation Age of Onset   Diabetes Mother    Cancer - Prostate Father     ALLERGIES:  is allergic to codeine and sulfa antibiotics.  MEDICATIONS:  Current Outpatient Medications  Medication Sig Dispense Refill   acetaminophen (TYLENOL) 500 MG tablet Take 1,000 mg by mouth 2 (two) times daily. Mainly just two at night.     Cholecalciferol (VITAMIN D PO) Take 1 tablet by mouth daily.     furosemide (LASIX) 40 MG tablet TAKE 1 TABLET (40 MG TOTAL) BY MOUTH 2 (TWO) TIMES DAILY. 30 tablet 0   levothyroxine (SYNTHROID, LEVOTHROID) 50 MCG tablet Take 50 mcg by mouth daily.  5   metoprolol succinate (TOPROL-XL) 25 MG 24 hr tablet TAKE 1/2 TABLET BY MOUTH EVERY DAY (TAKE WITH  A MEAL OR IMMEDIATELY FOLLOWING A MEAL) 45 tablet 2   Multiple Vitamins-Minerals (OCUVITE EYE HEATLH GUMMIES PO) Take 2 tablets by mouth daily.     Polyethyl Glycol-Propyl Glycol (SYSTANE OP) Place 1 drop into both eyes at bedtime.     potassium chloride SA (KLOR-CON M20) 20 MEQ tablet Take 2 tablets (40 mEq total) by mouth 2 (two) times daily. 360 tablet 3   anastrozole (ARIMIDEX) 1 MG tablet Take 1 tablet (1 mg total) by mouth daily. 90 tablet 3   diphenhydrAMINE (BENYLIN) 12.5 MG/5ML syrup Take 10 mLs (25 mg total) by mouth every  4 (four) hours as needed for itching or allergies. 120 mL 0   No current facility-administered medications for this visit.    REVIEW OF SYSTEMS:   Extremely hard of hearing All other systems were reviewed with the patient and are negative.  PHYSICAL EXAMINATION: ECOG PERFORMANCE STATUS: 1 - Symptomatic but completely ambulatory  Vitals:   12/31/19 0907  BP: (!) 139/57  Pulse: 65  Resp: 17  Temp: (!) 97.5 F (36.4 C)  SpO2: 100%   Filed Weights   12/31/19 0907  Weight: 171 lb 1.6 oz (77.6 kg)    Breast examination postradiation changes in the skin and palpable lump in the breast.  LABORATORY DATA:  I have reviewed the data as listed Lab Results  Component Value Date   WBC 6.7 12/31/2019   HGB 13.0 12/31/2019   HCT 38.9 12/31/2019   MCV 93.1 12/31/2019   PLT 174 12/31/2019   Lab Results  Component Value Date   NA 142 12/31/2019   K 4.0 12/31/2019   CL 104 12/31/2019   CO2 30 12/31/2019    RADIOGRAPHIC STUDIES: I have personally reviewed the radiological reports and agreed with the findings in the report.  ASSESSMENT AND PLAN:  Malignant neoplasm of upper-outer quadrant of left breast in female, estrogen receptor positive ([place]) 12/29/2019:Patient noted enlargement of the left breast. Mammogram showed a 3.3cm left breast mass at the 12 o'clock position.  Grade 2 IDC ER 95%, PR 90%, Ki-67 15%, HER-2 negative History of left breast  cancer in 1998 for which she underwent a lumpectomy and radiation  Treatment plan: Patient is elderly and her wishes are not to pursue surgery. Therefore after much discussion we determined that she would benefit from antiestrogen therapy for palliation. Anastrozole counseling: I discussed risks and benefits of anastrozole and she is willing to proceed. Our plan is to obtain mammograms in 6 months. Genetic counseling  Return to clinic in 3 months for follow-up to assess tolerability to anastrozole therapy.   All questions were answered. The patient knows to call the clinic with any problems, questions or concerns.   Rulon Eisenmenger, MD, MPH 12/31/2019    I, Molly Dorshimer, am acting as scribe for Nicholas Lose, MD.  I have reviewed the above documentation for accuracy and completeness, and I agree with the above.

## 2019-12-31 ENCOUNTER — Encounter: Payer: Self-pay | Admitting: *Deleted

## 2019-12-31 ENCOUNTER — Ambulatory Visit
Admission: RE | Admit: 2019-12-31 | Discharge: 2019-12-31 | Disposition: A | Discharge status: Home / Self Care | Payer: Medicare PPO | Source: Ambulatory Visit | Attending: Radiation Oncology | Admitting: Radiation Oncology

## 2019-12-31 ENCOUNTER — Inpatient Hospital Stay: Payer: Medicare PPO | Admitting: Licensed Clinical Social Worker

## 2019-12-31 ENCOUNTER — Other Ambulatory Visit: Payer: Self-pay

## 2019-12-31 ENCOUNTER — Inpatient Hospital Stay: Payer: Medicare PPO | Attending: Hematology and Oncology | Admitting: Hematology and Oncology

## 2019-12-31 ENCOUNTER — Encounter: Payer: Self-pay | Admitting: Hematology and Oncology

## 2019-12-31 ENCOUNTER — Inpatient Hospital Stay: Payer: Medicare PPO

## 2019-12-31 ENCOUNTER — Ambulatory Visit: Payer: Medicare PPO | Admitting: Physical Therapy

## 2019-12-31 DIAGNOSIS — Z170 Estrogen receptor positive status [ER+]: Secondary | ICD-10-CM

## 2019-12-31 DIAGNOSIS — C50412 Malignant neoplasm of upper-outer quadrant of left female breast: Secondary | ICD-10-CM | POA: Diagnosis not present

## 2019-12-31 DIAGNOSIS — E039 Hypothyroidism, unspecified: Secondary | ICD-10-CM | POA: Insufficient documentation

## 2019-12-31 DIAGNOSIS — Z853 Personal history of malignant neoplasm of breast: Secondary | ICD-10-CM | POA: Diagnosis not present

## 2019-12-31 DIAGNOSIS — I10 Essential (primary) hypertension: Secondary | ICD-10-CM | POA: Diagnosis not present

## 2019-12-31 DIAGNOSIS — H9190 Unspecified hearing loss, unspecified ear: Secondary | ICD-10-CM | POA: Diagnosis not present

## 2019-12-31 DIAGNOSIS — C50912 Malignant neoplasm of unspecified site of left female breast: Secondary | ICD-10-CM | POA: Diagnosis not present

## 2019-12-31 DIAGNOSIS — C50812 Malignant neoplasm of overlapping sites of left female breast: Secondary | ICD-10-CM

## 2019-12-31 LAB — CBC WITH DIFFERENTIAL (CANCER CENTER ONLY)
Abs Immature Granulocytes: 0.02 10*3/uL (ref 0.00–0.07)
Basophils Absolute: 0 10*3/uL (ref 0.0–0.1)
Basophils Relative: 1 %
Eosinophils Absolute: 0.2 10*3/uL (ref 0.0–0.5)
Eosinophils Relative: 3 %
HCT: 38.9 % (ref 36.0–46.0)
Hemoglobin: 13 g/dL (ref 12.0–15.0)
Immature Granulocytes: 0 %
Lymphocytes Relative: 23 %
Lymphs Abs: 1.5 10*3/uL (ref 0.7–4.0)
MCH: 31.1 pg (ref 26.0–34.0)
MCHC: 33.4 g/dL (ref 30.0–36.0)
MCV: 93.1 fL (ref 80.0–100.0)
Monocytes Absolute: 0.6 10*3/uL (ref 0.1–1.0)
Monocytes Relative: 9 %
Neutro Abs: 4.3 10*3/uL (ref 1.7–7.7)
Neutrophils Relative %: 64 %
Platelet Count: 174 10*3/uL (ref 150–400)
RBC: 4.18 MIL/uL (ref 3.87–5.11)
RDW: 12.8 % (ref 11.5–15.5)
WBC Count: 6.7 10*3/uL (ref 4.0–10.5)
nRBC: 0 % (ref 0.0–0.2)

## 2019-12-31 LAB — CMP (CANCER CENTER ONLY)
ALT: 7 U/L (ref 0–44)
AST: 14 U/L — ABNORMAL LOW (ref 15–41)
Albumin: 3.9 g/dL (ref 3.5–5.0)
Alkaline Phosphatase: 67 U/L (ref 38–126)
Anion gap: 8 (ref 5–15)
BUN: 19 mg/dL (ref 8–23)
CO2: 30 mmol/L (ref 22–32)
Calcium: 10.1 mg/dL (ref 8.9–10.3)
Chloride: 104 mmol/L (ref 98–111)
Creatinine: 0.94 mg/dL (ref 0.44–1.00)
GFR, Est AFR Am: >60 mL/min (ref >60)
GFR, Estimated: 53 mL/min — ABNORMAL LOW (ref >60)
Glucose, Bld: 113 mg/dL — ABNORMAL HIGH (ref 70–99)
Potassium: 4 mmol/L (ref 3.5–5.1)
Sodium: 142 mmol/L (ref 135–145)
Total Bilirubin: 1.4 mg/dL — ABNORMAL HIGH (ref 0.3–1.2)
Total Protein: 7.2 g/dL (ref 6.5–8.1)

## 2019-12-31 LAB — GENETIC SCREENING ORDER

## 2019-12-31 MED ORDER — ANASTROZOLE 1 MG PO TABS: 1.0000 mg | ORAL_TABLET | Freq: Every day | ORAL | 3 refills | Status: DC

## 2019-12-31 NOTE — Progress Notes (Signed)
[name] Clinical Social Work INITIAL SDOH Screening Note   [name] is a 86 y.o. year old female accompanied by daughter, [name], during [facility] today. [name] was given information about support services today including CSW contact information, information about support team members and programs.   SDOH (Social Determinants of Health) assessments performed: Yes- no needs identified    Family/Social Information:  . Housing Arrangement: patient lives alone . Family members/support persons in your life? Daughter, [name], lives 1 block away and provides practical and emotional support . Transportation: daughter provides . Financial concerns: No  . Employment: Retired. Income source: Conservation officer, historic buildings . Food Security: no concerns . Medication Concerns: no (if patient is experiencing medication concerns, please refer to pharmacy) . Services Currently in place:  n/a  . Concerns about diagnosis and/or treatment: not at this time. Feeling relieved that she can start with anti-estrogen pills and monitor progess instead of immediately undergoing surgery . Patient reported stressors: none since able to do antiestrogens Current coping skills/ strengths: Capable of independent living Supportive family/friends     SUMMARY: Current SDOH Barriers:  . none identified  Clinical Social Work Clinical Goal(s):  Marland Kitchen Patient will continue to attend medical appointments and take medication as prescribed  Interventions: . Patient interviewed and SDOH assessment performed . Provided patient with information about support programs   Follow Up Plan: Client will continue to attend appointments  Patient verbalizes understanding of plan: Yes   Megan Areola Jesselee Poth LCSW

## 2019-12-31 NOTE — Assessment & Plan Note (Signed)
12/29/2019:Patient noted enlargement of the left breast. Mammogram showed a 3.3cm left breast mass at the 12 o'clock position.  Grade 2 IDC ER 95%, PR 90%, Ki-67 15%, HER-2 negative History of left breast cancer in 1998 for which she underwent a lumpectomy and radiation  Treatment plan: Patient is elderly and her wishes are not to pursue surgery. Therefore after much discussion we determined that she would benefit from antiestrogen therapy for palliation. Anastrozole counseling: I discussed risks and benefits of anastrozole and she is willing to proceed. Our plan is to obtain mammograms in 6 months.  Return to clinic in 3 months for follow-up to assess tolerability to anastrozole therapy.

## 2019-12-31 NOTE — Progress Notes (Addendum)
Radiation Oncology         [phone]) [phone] ________________________________  Name: [name]        MRN: [id number]  Date of Service: 12/31/2019 DOB: [birth year]  YN:[id number], Fransico Him, MD  Alphonsa Overall, MD     REFERRING PHYSICIAN: Alphonsa Overall, MD   DIAGNOSIS: The encounter diagnosis was Malignant neoplasm of upper-outer quadrant of left breast in female, estrogen receptor positive ([place]).   HISTORY OF PRESENT ILLNESS: [name] is a 86 y.o. female seen in the multidisciplinary breast clinic for a new diagnosis of left breast cancer. The patient was noted to have a history of ER/PR positive invasive ductal carcinoma of the left breast which was treated with lumpectomy and nodal resection as well as adjuvant radiotherapy in 1998. She had recently noted thickening in the breast, and diagnostic imaging revealed a 3.3 cm mass at 12:00 with no abnormalities in the axilla. A biopsy revealed a grade 2 invasive ductal carcinoma. Her tumor was ER/PR positive, HER2 was negative by FISH, and Ki 67 was 15%. She's seen today to discuss treatment recommendations for her cancer.    PREVIOUS RADIATION THERAPY: Yes   1998: The left whole breast was treated following breast conservation. Details are otherwise unknown.    PAST MEDICAL HISTORY:  Past Medical History:  Diagnosis Date  . Anemia   . Anxiety   . Arthritis    "a little bit qwhere; mainly in my neck & shoulders" (07/09/2014)  . Bilateral leg edema   . Breast cancer, left breast ([place])    S/P chemo & radiation  . Chronic bronchitis ([place])    "usually get it q yr"  . Colon cancer ([place])    a. s/p resection 2002.  . Family history of adverse reaction to anesthesia    "son has PONV"  . H/O Bell's palsy   . Hypertension   . Hypothyroidism   . Paroxysmal atrial flutter ([place])    a. Dx 12/2013 - spont conv to NSR, placed on amiodarone. Back in flutter later 12/2013. s/p DCCV 01/2014 but did not hold - amio discontinued at that  time.  Marland Kitchen PONV (postoperative nausea and vomiting)        PAST SURGICAL HISTORY: Past Surgical History:  Procedure Laterality Date  . ABDOMINAL HERNIA REPAIR  01/2007  . ABDOMINAL HYSTERECTOMY  1975  . APPENDECTOMY  1975  . ATRIAL FLUTTER ABLATION  07/09/2014  . ATRIAL FLUTTER ABLATION N/A 07/09/2014   Procedure: ATRIAL FLUTTER ABLATION;  Surgeon: Thompson Grayer, MD;  [facility] CATH LAB;  Service: Cardiovascular;  Laterality: N/A;  . BREAST BIOPSY Left 1998  . BREAST LUMPECTOMY Left 1998  . CARDIOVERSION N/A 02/27/2014   Procedure: CARDIOVERSION;  Surgeon: Carlena Bjornstad, MD;  Location: [facility] ENDOSCOPY;  Service: Cardiovascular;  Laterality: N/A;  . CATARACT EXTRACTION    . CATARACT EXTRACTION W/ INTRAOCULAR LENS  IMPLANT, BILATERAL Bilateral 2011  . COLECTOMY    . EYE SURGERY    . HERNIA REPAIR    . LOW ANTERIOR BOWEL RESECTION  2002     FAMILY HISTORY:  Family History  Problem Relation Age of Onset  . Diabetes Mother   . Cancer - Prostate Father      SOCIAL HISTORY:  reports that she has quit smoking. Her smoking use included cigarettes. She has a 10.00 pack-year smoking history. She has never used smokeless tobacco. She reports current alcohol use. She reports that she does not use drugs. The patient is widowed and  lives in [place]. Her daughter accompanies her today.   ALLERGIES: Codeine and Sulfa antibiotics   MEDICATIONS:  Current Outpatient Medications  Medication Sig Dispense Refill  . acetaminophen (TYLENOL) 500 MG tablet Take 1,000 mg by mouth 2 (two) times daily. Mainly just two at night.    . Cholecalciferol (VITAMIN D PO) Take 1 tablet by mouth daily.    . diclofenac sodium (VOLTAREN) 1 % GEL Apply 2 g 4 (four) times daily topically. 100 g 3  . diphenhydrAMINE (BENYLIN) 12.5 MG/5ML syrup Take 10 mLs (25 mg total) by mouth every 4 (four) hours as needed for itching or allergies. 120 mL 0  . furosemide (LASIX) 40 MG tablet TAKE 1 TABLET (40 MG TOTAL) BY MOUTH 2  (TWO) TIMES DAILY. 30 tablet 0  . levothyroxine (SYNTHROID, LEVOTHROID) 50 MCG tablet Take 50 mcg by mouth daily.  5  . metoprolol succinate (TOPROL-XL) 25 MG 24 hr tablet TAKE 1/2 TABLET BY MOUTH EVERY DAY (TAKE WITH A MEAL OR IMMEDIATELY FOLLOWING A MEAL) 45 tablet 2  . Multiple Vitamins-Minerals (OCUVITE EYE HEATLH GUMMIES PO) Take 2 tablets by mouth daily.    [name] Glycol-Propyl Glycol (SYSTANE OP) Place 1 drop into both eyes at bedtime.    . potassium chloride SA (KLOR-CON M20) 20 MEQ tablet Take 2 tablets (40 mEq total) by mouth 2 (two) times daily. 360 tablet 3   No current facility-administered medications for this encounter.     REVIEW OF SYSTEMS: On review of systems, the patient reports that she is doing well overall. She denies any pain in the breast or concerns for breast symptoms otherwise.     PHYSICAL EXAM:  Wt Readings from Last 3 Encounters:  09/21/19 178 lb (80.7 kg)  12/01/16 187 lb (84.8 kg)  04/06/15 184 lb 12.8 oz (83.8 kg)   Temp Readings from Last 3 Encounters:  09/21/19 97.6 F (36.4 C)  12/01/16 98.1 F (36.7 C) (Oral)  09/19/15 98.3 F (36.8 C) (Oral)   BP Readings from Last 3 Encounters:  09/21/19 114/63  12/01/16 118/71  09/19/15 126/58   Pulse Readings from Last 3 Encounters:  09/21/19 86  12/01/16 83  09/19/15 74    In general this is a well appearing caucasian female in no acute distress. She's alert and oriented x4 and appropriate throughout the examination. Cardiopulmonary assessment is negative for acute distress and she exhibits normal effort. Bilateral breast exam is deferred.    ECOG = 1  0 - Asymptomatic (Fully active, able to carry on all predisease activities without restriction)  1 - Symptomatic but completely ambulatory (Restricted in physically strenuous activity but ambulatory and able to carry out work of a light or sedentary nature. For example, light housework, office work)  2 - Symptomatic, <50% in bed during  the day (Ambulatory and capable of all self care but unable to carry out any work activities. Up and about more than 50% of waking hours)  3 - Symptomatic, >50% in bed, but not bedbound (Capable of only limited self-care, confined to bed or chair 50% or more of waking hours)  4 - Bedbound (Completely disabled. Cannot carry on any self-care. Totally confined to bed or chair)  5 - Death   Eustace Pen MM, Creech RH, Tormey DC, et al. [phone]). "Toxicity and response criteria of the [facility]". Susan Moore Oncol. 5 (6): 649-55    LABORATORY DATA:  Lab Results  Component Value Date   WBC 8.6 07/06/2014  HGB 14.4 07/06/2014   HCT 42.8 07/06/2014   MCV 89.4 07/06/2014   PLT 204.0 07/06/2014   Lab Results  Component Value Date   NA 141 10/30/2014   K 4.7 10/30/2014   CL 99 10/30/2014   CO2 28 10/30/2014   Lab Results  Component Value Date   ALT 9 05/20/2014   AST 18 05/20/2014   ALKPHOS 84 05/20/2014   BILITOT 1.3 (H) 05/20/2014      RADIOGRAPHY: No results found.     IMPRESSION/PLAN: 1. Recurrent ER/PR positive, grade 2, invasive ductal carcinoma of the left breast in the setting of prior lumpectomy and radiotherapy in 1998. Dr. Lisbeth Renshaw discusses the pathology findings and reviews the nature of recurrent breast disease. Dr. Lisbeth Renshaw has reviewed her case and I discussed that based on her prior history of definitive radiotherapy, he would not recommend additional radiotherapy. The patient was discussed in breast oncology conference and is a candidate for surgical resection, and would be benefited by mastectomy and adjuvant antiestrogen therapy. That being said, the patient is not interested in surgery up front, though would consider this if absolutely necessary. Dr. Lindi Adie and Dr. [name] have discussed a 3 month course of antiestrogen therapy and reassessment, and if progression, may consider surgical resection at that time. We will follow along with her course and see  her as needed in the future.  In a visit lasting 45 minutes, greater than 50% of the time was spent face to face reviewing her case, as well as in preparation of, discussing, and coordinating the patient's care.     Carola Rhine, PAC

## 2019-12-31 NOTE — Addendum Note (Signed)
Encounter addended by: Hayden Pedro, PA-C on: 12/31/2019 1:49 PM  Actions taken: Clinical Note Signed

## 2019-12-31 NOTE — Progress Notes (Deleted)
Radiation Oncology         [phone] ________________________________  Name: [name]        MRN: [id number]  Date of Service: 12/31/2019 DOB: 12-Apr-1928  WU:[id number], Fransico Him, MD  Alphonsa Overall, MD     REFERRING PHYSICIAN: Alphonsa Overall, MD   DIAGNOSIS: The encounter diagnosis was Malignant neoplasm of upper-outer quadrant of left breast in female, estrogen receptor positive ([place]).   HISTORY OF PRESENT ILLNESS: [name] is a 86 y.o. female seen in the multidisciplinary breast clinic for a new diagnosis of left breast cancer. The patient was noted to have a history of ER/PR positive invasive ductal carcinoma of the left breast which was treated with lumpectomy and nodal resection as well as adjuvant radiotherapy in 1998. She had recently noted thickening in the breast, and diagnostic imaging revealed a 3.3 cm mass at 12:00 with no abnormalities in the axilla. A biopsy revealed a grade 2 invasive ductal carcinoma. Her tumor was ER/PR positive, HER2 was negative by FISH, and Ki 67 was 15%. She's seen today to discuss treatment recommendations for her cancer.    PREVIOUS RADIATION THERAPY: Yes   1998: The left whole breast was treated following breast conservation. Details are otherwise unknown.    PAST MEDICAL HISTORY:  Past Medical History:  Diagnosis Date  . Anemia   . Anxiety   . Arthritis    "a little bit qwhere; mainly in my neck & shoulders" (07/09/2014)  . Bilateral leg edema   . Breast cancer, left breast ([place])    S/P chemo & radiation  . Chronic bronchitis ([place])    "usually get it q yr"  . Colon cancer ([place])    a. s/p resection 2002.  . Family history of adverse reaction to anesthesia    "son has PONV"  . H/O Bell's palsy   . Hypertension   . Hypothyroidism   . Paroxysmal atrial flutter ([place])    a. Dx 12/2013 - spont conv to NSR, placed on amiodarone. Back in flutter later 12/2013. s/p DCCV 01/2014 but did not hold - amio discontinued at that  time.  Marland Kitchen PONV (postoperative nausea and vomiting)        PAST SURGICAL HISTORY: Past Surgical History:  Procedure Laterality Date  . ABDOMINAL HERNIA REPAIR  01/2007  . ABDOMINAL HYSTERECTOMY  1975  . APPENDECTOMY  1975  . ATRIAL FLUTTER ABLATION  07/09/2014  . ATRIAL FLUTTER ABLATION N/A 07/09/2014   Procedure: ATRIAL FLUTTER ABLATION;  Surgeon: Thompson Grayer, MD;  Location: [facility] CATH LAB;  Service: Cardiovascular;  Laterality: N/A;  . BREAST BIOPSY Left 1998  . BREAST LUMPECTOMY Left 1998  . CARDIOVERSION N/A 02/27/2014   Procedure: CARDIOVERSION;  Surgeon: Carlena Bjornstad, MD;  Location[facility] ENDOSCOPY;  Service: Cardiovascular;  Laterality: N/A;  . CATARACT EXTRACTION    . CATARACT EXTRACTION W/ INTRAOCULAR LENS  IMPLANT, BILATERAL Bilateral 2011  . COLECTOMY    . EYE SURGERY    . HERNIA REPAIR    . LOW ANTERIOR BOWEL RESECTION  2002     FAMILY HISTORY:  Family History  Problem Relation Age of Onset  . Diabetes Mother   . Cancer - Prostate Father      SOCIAL HISTORY:  reports that she has quit smoking. Her smoking use included cigarettes. She has a 10.00 pack-year smoking history. She has never used smokeless tobacco. She reports current alcohol use. She reports that she does not use drugs. The patient is widowed and  lives in [place]. Her daughter accompanies her today.   ALLERGIES: Codeine and Sulfa antibiotics   MEDICATIONS:  Current Outpatient Medications  Medication Sig Dispense Refill  . acetaminophen (TYLENOL) 500 MG tablet Take 1,000 mg by mouth 2 (two) times daily. Mainly just two at night.    . Cholecalciferol (VITAMIN D PO) Take 1 tablet by mouth daily.    . diclofenac sodium (VOLTAREN) 1 % GEL Apply 2 g 4 (four) times daily topically. 100 g 3  . diphenhydrAMINE (BENYLIN) 12.5 MG/5ML syrup Take 10 mLs (25 mg total) by mouth every 4 (four) hours as needed for itching or allergies. 120 mL 0  . furosemide (LASIX) 40 MG tablet TAKE 1 TABLET (40 MG TOTAL) BY MOUTH 2  (TWO) TIMES DAILY. 30 tablet 0  . levothyroxine (SYNTHROID, LEVOTHROID) 50 MCG tablet Take 50 mcg by mouth daily.  5  . metoprolol succinate (TOPROL-XL) 25 MG 24 hr tablet TAKE 1/2 TABLET BY MOUTH EVERY DAY (TAKE WITH A MEAL OR IMMEDIATELY FOLLOWING A MEAL) 45 tablet 2  . Multiple Vitamins-Minerals (OCUVITE EYE HEATLH GUMMIES PO) Take 2 tablets by mouth daily.    [name] Glycol-Propyl Glycol (SYSTANE OP) Place 1 drop into both eyes at bedtime.    . potassium chloride SA (KLOR-CON M20) 20 MEQ tablet Take 2 tablets (40 mEq total) by mouth 2 (two) times daily. 360 tablet 3   No current facility-administered medications for this encounter.     REVIEW OF SYSTEMS: On review of systems, the patient reports that she is doing well overall. She denies any pain in the breast or concerns for breast symptoms otherwise.     PHYSICAL EXAM:  Wt Readings from Last 3 Encounters:  09/21/19 178 lb (80.7 kg)  12/01/16 187 lb (84.8 kg)  04/06/15 184 lb 12.8 oz (83.8 kg)   Temp Readings from Last 3 Encounters:  09/21/19 97.6 F (36.4 C)  12/01/16 98.1 F (36.7 C) (Oral)  09/19/15 98.3 F (36.8 C) (Oral)   BP Readings from Last 3 Encounters:  09/21/19 114/63  12/01/16 118/71  09/19/15 126/58   Pulse Readings from Last 3 Encounters:  09/21/19 86  12/01/16 83  09/19/15 74    In general this is a well appearing caucasian female in no acute distress. She's alert and oriented x4 and appropriate throughout the examination. Cardiopulmonary assessment is negative for acute distress and she exhibits normal effort. Bilateral breast exam is deferred.    ECOG = 1  0 - Asymptomatic (Fully active, able to carry on all predisease activities without restriction)  1 - Symptomatic but completely ambulatory (Restricted in physically strenuous activity but ambulatory and able to carry out work of a light or sedentary nature. For example, light housework, office work)  2 - Symptomatic, <50% in bed during  the day (Ambulatory and capable of all self care but unable to carry out any work activities. Up and about more than 50% of waking hours)  3 - Symptomatic, >50% in bed, but not bedbound (Capable of only limited self-care, confined to bed or chair 50% or more of waking hours)  4 - Bedbound (Completely disabled. Cannot carry on any self-care. Totally confined to bed or chair)  5 - Death   Eustace Pen MM, Creech RH, Tormey DC, et al. [phone]). "Toxicity and response criteria of the [facility]". Susan Moore Oncol. 5 (6): 649-55    LABORATORY DATA:  Lab Results  Component Value Date   WBC 8.6 07/06/2014  HGB 14.4 07/06/2014   HCT 42.8 07/06/2014   MCV 89.4 07/06/2014   PLT 204.0 07/06/2014   Lab Results  Component Value Date   NA 141 10/30/2014   K 4.7 10/30/2014   CL 99 10/30/2014   CO2 28 10/30/2014   Lab Results  Component Value Date   ALT 9 05/20/2014   AST 18 05/20/2014   ALKPHOS 84 05/20/2014   BILITOT 1.3 (H) 05/20/2014      RADIOGRAPHY: No results found.     IMPRESSION/PLAN: 1. Recurrent ER/PR positive, grade 2, invasive ductal carcinoma of the left breast in the setting of prior lumpectomy and radiotherapy in 1998. Dr. Lisbeth Renshaw discusses the pathology findings and reviews the nature of recurrent breast disease. Dr. Lisbeth Renshaw discusses that based on her prior history of definitive radiotherapy, he would not recommend additional radiotherapy. The patient was discussed in breast oncology conference and is a candidate for surgical resection, and would be benefited by mastectomy and adjuvant antiestrogen therapy. That being said, the patient is not interested in surgery up front, though would consider this if absolutely necessary. Dr. Lindi Adie and Dr. [name] have discussed a 3 month course of antiestrogen therapy and reassessment, and if progression, may consider surgical resection at that time. We will follow along with her course and see her as needed in the  future.  In a visit lasting 45 minutes, greater than 50% of the time was spent face to face reviewing her case, as well as in preparation of, discussing, and coordinating the patient's care.  The above documentation reflects my direct findings during this shared patient visit. Please see the separate note by Dr. Lisbeth Renshaw on this date for the remainder of the patient's plan of care.    Carola Rhine, PAC

## 2020-01-02 ENCOUNTER — Telehealth: Payer: Self-pay | Admitting: Hematology and Oncology

## 2020-01-02 NOTE — Telephone Encounter (Signed)
Scheduled per 9/1 los. Called and spoke with daughter and confirmed 12/1 appt

## 2020-01-07 NOTE — Progress Notes (Signed)
Nutrition  Patient identified by attending Breast Clinic on 12/31/19.  Patient was given nutrition packet with RD contact information by nurse navigator.    Chart reviewed.   85 year old female with left breast cancer.  Patient wishes not to pursue surgery.  Patient has been placed on antiestrogens.  History of left breast cancer, s/p lumpectomy and radiation.    Ht: 65 inches Wt: 171 lb BMI 27  Patient currently is not at nutritional risk.  Please consult RD if changes in nutritional status occur.  [name], [place], [place] Registered Dietitian [phone] (mobile)

## 2020-01-08 ENCOUNTER — Encounter: Payer: Self-pay | Admitting: *Deleted

## 2020-01-08 NOTE — Progress Notes (Signed)
Spoke to pt daughter Pamala Hurry as pt "can't hear". Relate pt doing well. Denies questions or concerns regarding dx or treatment care plan. Encourage pt/daughter to call with concerns or needs.

## 2020-03-01 ENCOUNTER — Telehealth: Payer: Self-pay | Admitting: *Deleted

## 2020-03-01 NOTE — Telephone Encounter (Signed)
Notified that we will move appt sooner

## 2020-03-01 NOTE — Progress Notes (Signed)
Patient Care Team: Tisovec, Fransico Him, MD as PCP - General (Internal Medicine) Rockwell Germany, RN as Oncology Nurse Navigator Mauro Kaufmann, RN as Oncology Nurse Navigator Alphonsa Overall, MD as Consulting Physician (General Surgery) Nicholas Lose, MD as Consulting Physician (Hematology and Oncology) Kyung Rudd, MD as Consulting Physician (Radiation Oncology)  DIAGNOSIS:    ICD-10-CM   1. Malignant neoplasm of upper-outer quadrant of left breast in female, estrogen receptor positive ([place])  C50.412 US BREAST LTD UNI LEFT INC AXILLA   Z17.0 MM DIAG BREAST TOMO UNI LEFT    SUMMARY OF ONCOLOGIC HISTORY: Oncology History  Malignant neoplasm of upper-outer quadrant of left breast in female, estrogen receptor positive ([place])  1998 Cancer Diagnosis   History of left breast cancer in 1998 for which she underwent a lumpectomy and radiation   12/29/2019 Initial Diagnosis   Patient noted enlargement of the left breast. Mammogram showed a 3.3cm left breast mass at the 12 o'clock position.  Biopsy revealed grade 2 IDC ER 95%, PR 90%, Ki-67 15%, HER-2 negative   12/31/2019 Cancer Staging   Staging form: Breast, AJCC 8th Edition - Clinical stage from 12/31/2019: Stage IB (cT2, cN0, cM0, G2, ER+, PR+, HER2-) - Signed by Nicholas Lose, MD on 12/31/2019     CHIEF COMPLIANT: Follow-up of recurrent left breast cancer on anastrozole  INTERVAL HISTORY: [name] is a 86 y.o. with above-mentioned history of recurrent left breast cancer currently on antiestrogen therapy with anastrozole. She presents to the clinic today for follow-up.   ALLERGIES:  is allergic to codeine and sulfa antibiotics.  MEDICATIONS:  Current Outpatient Medications  Medication Sig Dispense Refill  . acetaminophen (TYLENOL) 500 MG tablet Take 1,000 mg by mouth 2 (two) times daily. Mainly just two at night.    Marland Kitchen anastrozole (ARIMIDEX) 1 MG tablet Take 1 tablet (1 mg total) by mouth daily. 90 tablet 3  . Cholecalciferol  (VITAMIN D PO) Take 1 tablet by mouth daily.    . diphenhydrAMINE (BENYLIN) 12.5 MG/5ML syrup Take 10 mLs (25 mg total) by mouth every 4 (four) hours as needed for itching or allergies. 120 mL 0  . furosemide (LASIX) 40 MG tablet TAKE 1 TABLET (40 MG TOTAL) BY MOUTH 2 (TWO) TIMES DAILY. 30 tablet 0  . levothyroxine (SYNTHROID, LEVOTHROID) 50 MCG tablet Take 50 mcg by mouth daily.  5  . metoprolol succinate (TOPROL-XL) 25 MG 24 hr tablet TAKE 1/2 TABLET BY MOUTH EVERY DAY (TAKE WITH A MEAL OR IMMEDIATELY FOLLOWING A MEAL) 45 tablet 2  . Multiple Vitamins-Minerals (OCUVITE EYE HEATLH GUMMIES PO) Take 2 tablets by mouth daily.    [name] Glycol-Propyl Glycol (SYSTANE OP) Place 1 drop into both eyes at bedtime.    . potassium chloride SA (KLOR-CON M20) 20 MEQ tablet Take 2 tablets (40 mEq total) by mouth 2 (two) times daily. 360 tablet 3   No current facility-administered medications for this visit.    PHYSICAL EXAMINATION: ECOG PERFORMANCE STATUS: 1 - Symptomatic but completely ambulatory  Vitals:   03/02/20 1032  BP: (!) 153/64  Pulse: 69  Resp: 18  Temp: 98.1 F (36.7 C)  SpO2: 97%   Filed Weights   03/02/20 1032  Weight: 171 lb 11.2 oz (77.9 kg)    BREAST: Firm and enlarging right breast mass with significant lymphedema in the fibrosis and hardening of the skin.  (exam performed in the presence of a chaperone)  LABORATORY DATA:  I have reviewed the data as listed  CMP Latest Ref Rng & Units 12/31/2019 10/30/2014 07/17/2014  Glucose 70 - 99 mg/dL 113(H) 71 89  BUN 8 - 23 mg/dL 19 29(H) 31(H)  Creatinine 0.44 - 1.00 mg/dL 0.94 1.18(H) 1.27(H)  Sodium 135 - 145 mmol/L 142 141 139  Potassium 3.5 - 5.1 mmol/L 4.0 4.7 4.4  Chloride 98 - 111 mmol/L 104 99 100  CO2 22 - 32 mmol/L _0 Calcium 8.9 - 10.3 mg/dL 10.1 9.0 9.2  Total Protein 6.5 - 8.1 g/dL 7.2 - -  Total Bilirubin 0.3 - 1.2 mg/dL 1.4(H) - -  Alkaline Phos 38 - 126 U/L 67 - -  AST 15 - 41 U/L 14(L) - -  ALT 0 - 44  U/L 7 - -    Lab Results  Component Value Date   WBC 6.7 12/31/2019   HGB 13.0 12/31/2019   HCT 38.9 12/31/2019   MCV 93.1 12/31/2019   PLT 174 12/31/2019   NEUTROABS 4.3 12/31/2019    ASSESSMENT & PLAN:  Malignant neoplasm of upper-outer quadrant of left breast in female, estrogen receptor positive ([place]) 12/29/2019:Patient noted enlargement of the left breast. Mammogram showed a 3.3cm left breast mass at the 12 o'clock position.  Grade 2 IDC ER 95%, PR 90%, Ki-67 15%, HER-2 negative History of left breast cancer in 1998 for which she underwent a lumpectomy and radiation  Treatment plan: Patient is elderly and initially her wishes are not to pursue surgery.  But now since that her tumor appears to be getting larger she is open to considering a mastectomy.  Anastrozole toxicities: Tolerating anastrozole fairly well.  We will obtain a mammogram and ultrasound at [facility]. I will send a referral to surgery to follow-up after the test so that they can talk about mastectomy.  Return to clinic after surgery to discuss final pathology report.    Orders Placed This Encounter  Procedures  . US BREAST LTD UNI LEFT INC AXILLA    Standing Status:   Future    Standing Expiration Date:   03/02/2021    Order Specific Question:   Reason for Exam (SYMPTOM  OR DIAGNOSIS REQUIRED)    Answer:   left breast cancer enlarging in size    Order Specific Question:   Preferred imaging location?    Answer:   External    Comments:   [facility]    Order Specific Question:   Release to patient    Answer:   Immediate  . MM DIAG BREAST TOMO UNI LEFT    Standing Status:   Future    Standing Expiration Date:   03/02/2021    Order Specific Question:   Reason for Exam (SYMPTOM  OR DIAGNOSIS REQUIRED)    Answer:   Left breast mass increasing in size    Order Specific Question:   Preferred imaging location?    Answer:   External    Comments:   [facility]   The patient has a good understanding of the overall plan. she  agrees with it. she will call with any problems that may develop before the next visit here.  Total time spent: 30 mins including face to face time and time spent for planning, charting and coordination of care  Nicholas Lose, MD 03/02/2020  I, Cloyde Reams Dorshimer, am acting as scribe for Dr. Nicholas Lose.  I have reviewed the above documentation for accuracy and completeness, and I agree with the above.

## 2020-03-01 NOTE — Telephone Encounter (Signed)
Patient states she feels like her cancer is growing. Denies pain, wants to know if she needs to have a mammogram or see Dr Lindi Adie sooner that 12/1.

## 2020-03-02 ENCOUNTER — Inpatient Hospital Stay: Payer: Medicare PPO | Attending: Hematology and Oncology | Admitting: Hematology and Oncology

## 2020-03-02 ENCOUNTER — Other Ambulatory Visit: Payer: Self-pay

## 2020-03-02 DIAGNOSIS — Z79811 Long term (current) use of aromatase inhibitors: Secondary | ICD-10-CM | POA: Insufficient documentation

## 2020-03-02 DIAGNOSIS — Z923 Personal history of irradiation: Secondary | ICD-10-CM | POA: Diagnosis not present

## 2020-03-02 DIAGNOSIS — Z170 Estrogen receptor positive status [ER+]: Secondary | ICD-10-CM | POA: Diagnosis not present

## 2020-03-02 DIAGNOSIS — C50412 Malignant neoplasm of upper-outer quadrant of left female breast: Secondary | ICD-10-CM | POA: Insufficient documentation

## 2020-03-02 NOTE — Assessment & Plan Note (Signed)
12/29/2019:Patient noted enlargement of the left breast. Mammogram showed a 3.3cm left breast mass at the 12 o'clock position.  Grade 2 IDC ER 95%, PR 90%, Ki-67 15%, HER-2 negative History of left breast cancer in 1998 for which she underwent a lumpectomy and radiation  Treatment plan: Patient is elderly and her wishes are not to pursue surgery. Therefore after much discussion we determined that she would benefit from antiestrogen therapy for palliation. Anastrozole toxicities:  Our plan is to obtain mammograms every 6 months.  Return to clinic in 3 months with mammogram and  follow-up

## 2020-03-03 DIAGNOSIS — N6321 Unspecified lump in the left breast, upper outer quadrant: Secondary | ICD-10-CM | POA: Diagnosis not present

## 2020-03-03 DIAGNOSIS — R928 Other abnormal and inconclusive findings on diagnostic imaging of breast: Secondary | ICD-10-CM | POA: Diagnosis not present

## 2020-03-03 DIAGNOSIS — N6322 Unspecified lump in the left breast, upper inner quadrant: Secondary | ICD-10-CM | POA: Diagnosis not present

## 2020-03-04 ENCOUNTER — Encounter: Payer: Self-pay | Admitting: *Deleted

## 2020-03-04 DIAGNOSIS — H9190 Unspecified hearing loss, unspecified ear: Secondary | ICD-10-CM | POA: Diagnosis not present

## 2020-03-04 DIAGNOSIS — C50912 Malignant neoplasm of unspecified site of left female breast: Secondary | ICD-10-CM | POA: Diagnosis not present

## 2020-03-10 DIAGNOSIS — N1831 Chronic kidney disease, stage 3a: Secondary | ICD-10-CM | POA: Diagnosis not present

## 2020-03-10 DIAGNOSIS — I130 Hypertensive heart and chronic kidney disease with heart failure and stage 1 through stage 4 chronic kidney disease, or unspecified chronic kidney disease: Secondary | ICD-10-CM | POA: Diagnosis not present

## 2020-03-10 DIAGNOSIS — Z853 Personal history of malignant neoplasm of breast: Secondary | ICD-10-CM | POA: Diagnosis not present

## 2020-03-10 DIAGNOSIS — E039 Hypothyroidism, unspecified: Secondary | ICD-10-CM | POA: Diagnosis not present

## 2020-03-10 DIAGNOSIS — I509 Heart failure, unspecified: Secondary | ICD-10-CM | POA: Diagnosis not present

## 2020-03-10 DIAGNOSIS — R7302 Impaired glucose tolerance (oral): Secondary | ICD-10-CM | POA: Diagnosis not present

## 2020-03-10 DIAGNOSIS — C50912 Malignant neoplasm of unspecified site of left female breast: Secondary | ICD-10-CM | POA: Diagnosis not present

## 2020-03-10 DIAGNOSIS — E669 Obesity, unspecified: Secondary | ICD-10-CM | POA: Diagnosis not present

## 2020-03-10 DIAGNOSIS — Z01818 Encounter for other preprocedural examination: Secondary | ICD-10-CM | POA: Diagnosis not present

## 2020-03-10 DIAGNOSIS — Z170 Estrogen receptor positive status [ER+]: Secondary | ICD-10-CM | POA: Diagnosis not present

## 2020-03-15 ENCOUNTER — Encounter: Payer: Self-pay | Admitting: *Deleted

## 2020-03-16 ENCOUNTER — Encounter (HOSPITAL_BASED_OUTPATIENT_CLINIC_OR_DEPARTMENT_OTHER): Payer: Self-pay | Admitting: Surgery

## 2020-03-16 ENCOUNTER — Other Ambulatory Visit: Payer: Self-pay

## 2020-03-17 NOTE — Progress Notes (Signed)
      Enhanced Recovery after Surgery for Orthopedics Enhanced Recovery after Surgery is a protocol used to improve the stress on your body and your recovery after surgery.  Patient Instructions  . The night before surgery:  o No food after midnight. ONLY clear liquids after midnight  . The day of surgery (if you do NOT have diabetes):  o Drink ONE (1) Pre-Surgery Clear Ensure as directed.   o This drink was given to you during your hospital  pre-op appointment visit. o The pre-op nurse will instruct you on the time to drink the  Pre-Surgery Ensure depending on your surgery time. o Finish the drink at the designated time by the pre-op nurse.  o Nothing else to drink after completing the  Pre-Surgery Clear Ensure.  . The day of surgery (if you have diabetes): o Drink ONE (1) Gatorade 2 (G2) as directed. o This drink was given to you during your hospital  pre-op appointment visit.  o The pre-op nurse will instruct you on the time to drink the   Gatorade 2 (G2) depending on your surgery time. o Color of the Gatorade may vary. Red is not allowed. o Nothing else to drink after completing the  Gatorade 2 (G2).         If you have questions, please contact your surgeon's office.  Surgical soap given to pt's daughter with instructions given.  Pt's daughter verbalized understandings.

## 2020-03-18 ENCOUNTER — Encounter: Payer: Self-pay | Admitting: *Deleted

## 2020-03-19 ENCOUNTER — Other Ambulatory Visit (HOSPITAL_COMMUNITY)
Admission: RE | Admit: 2020-03-19 | Discharge: 2020-03-19 | Disposition: A | Discharge status: Home / Self Care | Payer: Medicare PPO | Source: Ambulatory Visit | Attending: Surgery | Admitting: Surgery

## 2020-03-19 ENCOUNTER — Telehealth: Payer: Self-pay | Admitting: Hematology and Oncology

## 2020-03-19 DIAGNOSIS — Z20822 Contact with and (suspected) exposure to covid-19: Secondary | ICD-10-CM | POA: Insufficient documentation

## 2020-03-19 DIAGNOSIS — Z01812 Encounter for preprocedural laboratory examination: Secondary | ICD-10-CM | POA: Diagnosis not present

## 2020-03-19 LAB — SARS CORONAVIRUS 2 (TAT 6-24 HRS): SARS Coronavirus 2: NEGATIVE

## 2020-03-19 NOTE — Telephone Encounter (Signed)
Scheduled appointment per 11/18 sch msg. Spoke to patient's daughter who is aware of appointment date and time.

## 2020-03-21 ENCOUNTER — Other Ambulatory Visit: Payer: Self-pay | Admitting: Surgery

## 2020-03-21 NOTE — H&P (Signed)
[name]  Location: [facility] Patient #: [id number] DOB: 04/24/1928 Widowed / Language: English / Race: White Female  History of Present Illness   The patient is a 86 year old female who presents with a complaint of breast cancer.  The PCP is Dr. R. Tisovec Her daughter, Barbara Armeniox, is with her. The patient is very hard of hearing and the daughter worked as an "interpreter".  The patient has recurrent left breast cancer. She was originally treated in 1998 with a left breast lumpectomy, left axillary node dissection, and radiation therapy. She was seen at the multidisciplinary breast cancer clinic on September 1 with Drs. Gudena and Moody. We had a discussion of proceeding with mastectomy versus anti-hormone therapy. She elected for antihormone therapy with anastrozole. She saw Dr. Gudena on 02 March 2020 with concerns of enlargement of the left breast mass in spite of antihormone therapy.  Her original mammograms/US at [facility] on 12/11/2019 showed a 3.3 cm x 2 cm x 2.2 cm solid mass at 12 o'clock in the left breast. Her repeat mammogram/US at [facility] on 03/03/2020 showed a 1.8 cm x 2.4 cm x 1.7 cm solid mass in the left breast. So by imaging, the mass appears smaller. But I agree with the patient, on my physical exam and from the photo that I took on 12/31/2019 of her left breast, the left breast mass appears larger.  I discussed the options for breast cancer treatment with the patient. I think that the options are continue anti hormone therapy vs mastectomy. The risks of surgery include, but are not limited to, bleeding, infection, the need for further surgery, and nerve injury. The patient has been given literature on the treatment of breast cancer. We had another long discussion about the options which include mastectomy vs continued antihormone therapy. I gave copies of the [facility] reports to the patient - but I now lean more  towards mastectomy, because I think that it will be hard to follow the left breast and it clinically looks worse than when I saw her in early September.  Plan: 1. Clearance for surgery by Dr. Tisovec, 2. Left simple mastectomy  Past Medical History: 1. Recurrent left breast cancer The patient was [facility] at [facility] - Oncology is Drs. Gudena/Moody Left breast biopsy on 12/24/2019 ([id number]) shows IDC, grade 2, ER - 95%, PR - 90%, Ki67 - 15%, Her2Neu - neg On anastrazole 2. Left breast cancer Treated with lumpectomy and left axillary node dissection in 1998 - [place]. There are no notes in Epic on this treatment. 2. Very hard of hearing 3. Poor vision 4. A fib - she did see Dr. J. Katz - but she is not in A. Fib and not seeing cardiology. Doing well on the metoprolol Now followed by Dr. Tisovec 6. Thyroid replacement 7. Arthritis 8. History of recto-sigmoid colon cancer - 07/17/2000 - [facility] 9. abdominal wall hernia repaired 03/01/2007 - [facility]  Social History: Widow. She lives by herself, is one block from her daughter. Her daughter, Barbara Armeniox, is with her. 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 She also has a son, Matt Tuggle, who has his medical issues. Ms. Glotfelty had a brother who died of a brain tumor.   Past Surgical History (April Staton, CMA; 03/04/2020 4:09 PM) Breast Biopsy  Left. Breast Mass; Local Excision  Left. Cataract Surgery  Bilateral. Colon Removal - Partial  Hysterectomy (not due to cancer) - Complete    Sentinel Lymph Node Biopsy  Ventral / Umbilical Hernia Surgery  Right.  Diagnostic Studies History (April Staton, CMA; 03/04/2020 4:09 PM) Colonoscopy  >10 years ago Mammogram  within last year Pap Smear  >5 years ago  Allergies (April Staton, CMA; 03/04/2020 4:10 PM) Codeine Phosphate *ANALGESICS - OPIOID*  Nausea, Vomiting. Sulfa  Antibiotics  Nausea.  Medication History (April Staton, CMA; 03/04/2020 4:10 PM) Levothyroxine Sodium (50MCG Tablet, Oral) Active. Metoprolol Succinate ER (25MG Tablet ER 24HR, Oral) Active. Cholecalciferol (1.25 MG(50000 UT) Tablet, Oral) Active. Voltaren (1% Gel, External) Active. Lasix (40MG Tablet, Oral) Active. Potassium Chloride CR (20MEQ Tablet ER, Oral) Active. Multiple Vitamin (1 (one) Oral) Active. Medications Reconciled  Social History (April Staton, CMA; 03/04/2020 4:09 PM) Alcohol use  Occasional alcohol use. Caffeine use  Coffee. No drug use  Tobacco use  Former smoker.  Family History (April Staton, CMA; 03/04/2020 4:09 PM) Arthritis  Mother. Cancer  Brother. Diabetes Mellitus  Mother. Heart Disease  Father. Migraine Headache  Mother.  Pregnancy / Birth History (April Staton, CMA; 03/04/2020 4:09 PM) Age at menarche  13 years. Age of menopause  51-55 Gravida  2 Maternal age  21-25 Para  2 Regular periods   Other Problems (April Staton, CMA; 03/04/2020 4:09 PM) Anxiety Disorder  Arthritis  Atrial Fibrillation  Bladder Problems  Breast Cancer  Colon Cancer  Depression  Heart murmur  High blood pressure  Lump In Breast  Other disease, cancer, significant illness  Thyroid Disease     Review of Systems (April Staton CMA; 03/04/2020 4:09 PM) General Not Present- Appetite Loss, Chills, Fatigue, Fever, Night Sweats, Weight Gain and Weight Loss. Skin Present- Dryness. Not Present- Change in Wart/Mole, Hives, Jaundice, New Lesions, Non-Healing Wounds, Rash and Ulcer. HEENT Present- Hearing Loss, Seasonal Allergies, Visual Disturbances and Wears glasses/contact lenses. Not Present- Earache, Hoarseness, Nose Bleed, Oral Ulcers, Ringing in the Ears, Sinus Pain, Sore Throat and Yellow Eyes. Respiratory Not Present- Bloody sputum, Chronic Cough, Difficulty Breathing, Snoring and Wheezing. Breast Present- Breast Mass, Breast Pain and  Skin Changes. Not Present- Nipple Discharge. Cardiovascular Present- Leg Cramps and Swelling of Extremities. Not Present- Chest Pain, Difficulty Breathing Lying Down, Palpitations, Rapid Heart Rate and Shortness of Breath. Gastrointestinal Present- Constipation. Not Present- Abdominal Pain, Bloating, Bloody Stool, Change in Bowel Habits, Chronic diarrhea, Difficulty Swallowing, Excessive gas, Gets full quickly at meals, Hemorrhoids, Indigestion, Nausea, Rectal Pain and Vomiting. Female Genitourinary Present- Frequency, Nocturia and Urgency. Not Present- Painful Urination and Pelvic Pain. Musculoskeletal Present- Joint Pain, Muscle Pain, Muscle Weakness and Swelling of Extremities. Not Present- Back Pain and Joint Stiffness. Neurological Present- Decreased Memory, Numbness, Trouble walking and Weakness. Not Present- Fainting, Headaches, Seizures, Tingling and Tremor. Psychiatric Present- Anxiety and Frequent crying. Not Present- Bipolar, Change in Sleep Pattern, Depression and Fearful. Endocrine Present- Cold Intolerance. Not Present- Excessive Hunger, Hair Changes, Heat Intolerance, Hot flashes and New Diabetes. Hematology Not Present- Blood Thinners, Easy Bruising, Excessive bleeding, Gland problems, HIV and Persistent Infections.  Vitals (April Staton CMA; 03/04/2020 4:10 PM) 03/04/2020 4:10 PM Weight: 173.5 lb Height: 66in Body Surface Area: 1.88 m Body Mass Index: 28 kg/m  Temp.: 97.9F (Temporal)  Pulse: 73 (Regular)  P.OX: 98% (Room air) BP: 130/70(Sitting, Right Arm, Standard)   Physical Exam  General: Older WF who is alert and generally healthy appearing. She is very hard of hearing and her daughter functioned as her "interpreter" HEENT: Normal. Pupils equal.  Neck: Supple. No mass. No thyroid mass.  Lymph Nodes: No supraclavicular, cervical or   axillary nodes. I think that all the lymph nodes in her left axilla have been removed.  Lungs: Clear to auscultation  and symmetric breath sounds. Heart: RRR. No murmur or rub.  Breast: right - no mass or nodule Left - firm 6-7 cm mass above the right nipple. Her left nipple is inverted. The scar from her lumpectomy is above the mass. The mass seems larger to me than I remember and the breast looks "worse" compared to the picture that I took  [place] Scrape is a photo in [place] on 12/31/2019 of her left breast for comparison]  the left breast is not fixed to the chest wall  Abdomen: Soft. No mass. No tenderness. No hernia. Normal bowel sounds.   Extremities: Good strength and ROM in upper and lower extremities. Walks with assistance. She needs some help in getting on to the exam table.   Assessment & Plan  1.  BREAST CANCER, STAGE 2, LEFT   Story: Left breast biopsy on 12/24/2019 ([id number]) shows IDC, grade 2, ER - 95%, PR - 90%, Ki67 - 15%, Her2Neu - neg  Oncology - Gudena and Lisbeth Renshaw  She has been on anastrazole  Plan:  1. Clearance for surgery by Dr. Osborne Casco  2. Left simple mastectomy  2. Left breast cancer Treated with lumpectomy and left axillary node dissection in [place]. There are no notes in Epic on this treatment.  3.  HARD OF HEARING 4. Poor vision 5. A fib - she did see Dr. Veatrice Bourbon - but she is not in A. Fib and not seeing cardiology. Doing well on the metoprolol Now followed by Dr. Osborne Casco 6. Thyroid replacement 7. Arthritis 8. History of recto-sigmoid colon cancer - 07/17/2000 - Delfina Redwood, MD, [facility] Surgery Office phone:  [phone]

## 2020-03-21 NOTE — H&P (View-Only) (Signed)
[name]  Location: [name] Patient #: [id number] DOB: June 22, 1927 Widowed / Language: [name] / Race: White Female  History of Present Illness   The patient is a 86 year old female who presents with a complaint of breast cancer.  The PCP is Dr. Alfonso [name]. Tisovec Her daughter, [name], is with her. The patient is very hard of hearing and the daughter worked as an Product/process development scientist".  The patient has recurrent left breast cancer. She was originally treated in 1998 with a left breast lumpectomy, left axillary node dissection, and radiation therapy. She was seen at the multidisciplinary breast cancer clinic on September 1 with Drs. [name] and the [name] and [name]. We had a discussion of proceeding with mastectomy versus anti-hormone therapy. She elected for antihormone therapy with anastrozole. She saw Dr. Lindi [name] on 02 March 2020 with concerns of enlargement of the left breast mass in spite of antihormone therapy.  Her original mammograms/[facility] at [name] on 12/11/2019 showed a 3.3 cm x 2 cm x 2.2 cm solid mass at 12 o'clock in the left breast. Her repeat mammogram/US at [name] on 03/03/2020 showed a 1.8 cm x 2.4 cm x 1.7 cm solid mass in the left breast. So by imaging, the mass appears smaller. But I agree with the patient, on my physical exam and from the photo that I took on 12/31/2019 of her left breast, the left breast mass appears larger.  I discussed the options for breast cancer treatment with the patient. I think that the options are continue anti hormone therapy vs mastectomy. The risks of surgery include, but are not limited to, bleeding, infection, the need for further surgery, and nerve injury. The patient has been given literature on the treatment of breast cancer. We had another long discussion about the options which include mastectomy vs continued antihormone therapy. I gave copies of the [facility] reports to the patient - but I now lean more  towards mastectomy, because I think that it will be hard to follow the left breast and it clinically looks worse than when I saw her in early September.  Plan: 1. Clearance for surgery by Dr. Osborne [name], 2. Left simple mastectomy  Past Medical History: 1. Recurrent left breast cancer The patient was seen at the [name] - Oncology is Drs. [name] Left breast biopsy on 12/24/2019 ([id number]) shows IDC, grade 2, ER - 95%, PR - 90%, Ki67 - 15%, Her2Neu - neg On anastrazole 2. Left breast cancer Treated with lumpectomy and left axillary node dissection in [place]. There are no notes in Epic on this treatment. 2. Very hard of hearing 3. Poor vision 4. A fib - she did see Dr. Veatrice [name] - but she is not in A. Fib and not seeing cardiology. Doing well on the metoprolol Now followed by Dr. Osborne [name] 6. Thyroid replacement 7. Arthritis 8. History of recto-sigmoid colon cancer - 07/17/2000 - [name] 9. abdominal wall hernia repaired 03/01/2007 - [name]  Social History: Widow. She lives by herself, is one block from her daughter. Her daughter, [name], is with her. She recently retired from [name] - so she can look after her mother. She also has a son, [name], who has his medical issues. [name] had a brother who died of a brain tumor.   Past Surgical History ([name], [name]; 03/04/2020 4:09 PM) Breast Biopsy  Left. Breast Mass; Local Excision  Left. Cataract Surgery  Bilateral. Colon Removal - Partial  Hysterectomy (not due to cancer) - Complete  Sentinel Lymph Node Biopsy  Ventral / Umbilical Hernia Surgery  Right.  Diagnostic Studies History ([name], [name]; 03/04/2020 4:09 PM) Colonoscopy  >10 years ago Mammogram  within last year Pap Smear  >5 years ago  Allergies ([name], [name]; 03/04/2020 4:10 PM) Codeine Phosphate *ANALGESICS - OPIOID*  Nausea, Vomiting. Sulfa  Antibiotics  Nausea.  Medication History ([name], [name]; 03/04/2020 4:10 PM) Levothyroxine Sodium (50MCG Tablet, Oral) Active. Metoprolol Succinate ER (25MG Tablet ER 24HR, Oral) Active. Cholecalciferol (1.25 MG(50000 UT) Tablet, Oral) Active. Voltaren (1% Gel, External) Active. Lasix (40MG Tablet, Oral) Active. Potassium Chloride CR (20MEQ Tablet ER, Oral) Active. Multiple Vitamin (1 (one) Oral) Active. Medications Reconciled  Social History ([name], [name]; 03/04/2020 4:09 PM) Alcohol use  Occasional alcohol use. Caffeine use  Coffee. No drug use  Tobacco use  Former smoker.  Family History ([name], [name]; 03/04/2020 4:09 PM) Arthritis  Mother. Cancer  Brother. Diabetes Mellitus  Mother. Heart Disease  Father. Migraine Headache  Mother.  Pregnancy / Birth History ([name], [name]; 03/04/2020 4:09 PM) Age at menarche  54 years. Age of menopause  61-55 Gravida  2 Maternal age  53-25 Para  2 Regular periods   Other Problems ([name], [name]; 03/04/2020 4:09 PM) Anxiety Disorder  Arthritis  Atrial Fibrillation  Bladder Problems  Breast Cancer  Colon Cancer  Depression  Heart murmur  High blood pressure  Lump In Breast  Other disease, cancer, significant illness  Thyroid Disease     Review of Systems ([name]; 03/04/2020 4:09 PM) General Not Present- Appetite Loss, Chills, Fatigue, Fever, Night Sweats, Weight Gain and Weight Loss. Skin Present- Dryness. Not Present- Change in Wart/Mole, Hives, Jaundice, New Lesions, Non-Healing Wounds, Rash and Ulcer. HEENT Present- Hearing Loss, Seasonal Allergies, Visual Disturbances and Wears glasses/contact lenses. Not Present- Earache, Hoarseness, Nose Bleed, Oral Ulcers, Ringing in the Ears, Sinus Pain, Sore Throat and Yellow Eyes. Respiratory Not Present- Bloody sputum, Chronic Cough, Difficulty Breathing, Snoring and Wheezing. Breast Present- Breast Mass, Breast Pain and  Skin Changes. Not Present- Nipple Discharge. Cardiovascular Present- Leg Cramps and Swelling of Extremities. Not Present- Chest Pain, Difficulty Breathing Lying Down, Palpitations, Rapid Heart Rate and Shortness of Breath. Gastrointestinal Present- Constipation. Not Present- Abdominal Pain, Bloating, Bloody Stool, Change in Bowel Habits, Chronic diarrhea, Difficulty Swallowing, Excessive gas, Gets full quickly at meals, Hemorrhoids, Indigestion, Nausea, Rectal Pain and Vomiting. Female Genitourinary Present- Frequency, Nocturia and Urgency. Not Present- Painful Urination and Pelvic Pain. Musculoskeletal Present- Joint Pain, Muscle Pain, Muscle Weakness and Swelling of Extremities. Not Present- Back Pain and Joint Stiffness. Neurological Present- Decreased Memory, Numbness, Trouble walking and Weakness. Not Present- Fainting, Headaches, Seizures, Tingling and Tremor. Psychiatric Present- Anxiety and Frequent crying. Not Present- Bipolar, Change in Sleep Pattern, Depression and Fearful. Endocrine Present- Cold Intolerance. Not Present- Excessive Hunger, Hair Changes, Heat Intolerance, Hot flashes and New Diabetes. Hematology Not Present- Blood Thinners, Easy Bruising, Excessive bleeding, Gland problems, HIV and Persistent Infections.  Vitals ([name]; 03/04/2020 4:10 PM) 03/04/2020 4:10 PM Weight: 173.5 lb Height: 66in Body Surface Area: 1.88 m Body Mass Index: 28 kg/m  Temp.: 97.9F (Temporal)  Pulse: 73 (Regular)  P.OX: 98% (Room air) BP: 130/70(Sitting, Right Arm, Standard)   Physical Exam  General: Older WF who is alert and generally healthy appearing. She is very hard of hearing and her daughter functioned as her "interpreter" HEENT: Normal. Pupils equal.  Neck: Supple. No mass. No thyroid mass.  Lymph Nodes: No supraclavicular, cervical or  axillary nodes. I think that all the lymph nodes in her left axilla have been removed.  Lungs: Clear to auscultation  and symmetric breath sounds. Heart: RRR. No murmur or rub.  Breast: right - no mass or nodule Left - firm 6-7 cm mass above the right nipple. Her left nipple is inverted. The scar from her lumpectomy is above the mass. The mass seems larger to me than I remember and the breast looks "worse" compared to the picture that I took  [place] Scrape is a photo in [place] on 12/31/2019 of her left breast for comparison]  the left breast is not fixed to the chest wall  Abdomen: Soft. No mass. No tenderness. No hernia. Normal bowel sounds.   Extremities: Good strength and ROM in upper and lower extremities. Walks with assistance. She needs some help in getting on to the exam table.   Assessment & Plan  1.  BREAST CANCER, STAGE 2, LEFT   Story: Left breast biopsy on 12/24/2019 ([id number]) shows IDC, grade 2, ER - 95%, PR - 90%, Ki67 - 15%, Her2Neu - neg  Oncology - [name] and Lisbeth Renshaw  She has been on anastrazole  Plan:  1. Clearance for surgery by Dr. Osborne [name]  2. Left simple mastectomy  2. Left breast cancer Treated with lumpectomy and left axillary node dissection in [place]. There are no notes in Epic on this treatment.  3.  HARD OF HEARING 4. Poor vision 5. A fib - she did see Dr. Veatrice [name] - but she is not in A. Fib and not seeing cardiology. Doing well on the metoprolol Now followed by Dr. Osborne [name] 6. Thyroid replacement 7. Arthritis 8. History of recto-sigmoid colon cancer - 07/17/2000 - Delfina Redwood, MD, [facility] Surgery Office phone:  [phone]

## 2020-03-22 NOTE — Progress Notes (Signed)
Chart reviewed with Dr Sabra Heck who said OK to proceed with surgery

## 2020-03-23 ENCOUNTER — Encounter (HOSPITAL_BASED_OUTPATIENT_CLINIC_OR_DEPARTMENT_OTHER)
Admission: RE | Disposition: A | Discharge status: Home / Self Care | Payer: Self-pay | Source: Home / Self Care | Attending: Surgery

## 2020-03-23 ENCOUNTER — Other Ambulatory Visit: Payer: Self-pay

## 2020-03-23 ENCOUNTER — Encounter (HOSPITAL_BASED_OUTPATIENT_CLINIC_OR_DEPARTMENT_OTHER): Payer: Self-pay | Admitting: Surgery

## 2020-03-23 ENCOUNTER — Ambulatory Visit (HOSPITAL_BASED_OUTPATIENT_CLINIC_OR_DEPARTMENT_OTHER): Payer: Medicare PPO | Admitting: Certified Registered Nurse Anesthetist

## 2020-03-23 ENCOUNTER — Observation Stay (HOSPITAL_BASED_OUTPATIENT_CLINIC_OR_DEPARTMENT_OTHER)
Admission: RE | Admit: 2020-03-23 | Discharge: 2020-03-24 | Disposition: A | Discharge status: Home / Self Care | Payer: Medicare PPO | Attending: Surgery | Admitting: Surgery

## 2020-03-23 DIAGNOSIS — Z87891 Personal history of nicotine dependence: Secondary | ICD-10-CM | POA: Diagnosis not present

## 2020-03-23 DIAGNOSIS — C50912 Malignant neoplasm of unspecified site of left female breast: Secondary | ICD-10-CM | POA: Diagnosis not present

## 2020-03-23 DIAGNOSIS — H9190 Unspecified hearing loss, unspecified ear: Secondary | ICD-10-CM | POA: Diagnosis not present

## 2020-03-23 DIAGNOSIS — I10 Essential (primary) hypertension: Secondary | ICD-10-CM | POA: Insufficient documentation

## 2020-03-23 DIAGNOSIS — I110 Hypertensive heart disease with heart failure: Secondary | ICD-10-CM | POA: Diagnosis not present

## 2020-03-23 DIAGNOSIS — C50412 Malignant neoplasm of upper-outer quadrant of left female breast: Secondary | ICD-10-CM | POA: Diagnosis not present

## 2020-03-23 DIAGNOSIS — Z79899 Other long term (current) drug therapy: Secondary | ICD-10-CM | POA: Insufficient documentation

## 2020-03-23 DIAGNOSIS — H539 Unspecified visual disturbance: Secondary | ICD-10-CM | POA: Diagnosis not present

## 2020-03-23 DIAGNOSIS — I4891 Unspecified atrial fibrillation: Secondary | ICD-10-CM | POA: Insufficient documentation

## 2020-03-23 DIAGNOSIS — N183 Chronic kidney disease, stage 3 unspecified: Secondary | ICD-10-CM | POA: Diagnosis not present

## 2020-03-23 DIAGNOSIS — Z170 Estrogen receptor positive status [ER+]: Secondary | ICD-10-CM

## 2020-03-23 DIAGNOSIS — N61 Mastitis without abscess: Secondary | ICD-10-CM | POA: Diagnosis not present

## 2020-03-23 DIAGNOSIS — N6032 Fibrosclerosis of left breast: Secondary | ICD-10-CM | POA: Diagnosis not present

## 2020-03-23 HISTORY — PX: SIMPLE MASTECTOMY WITH AXILLARY SENTINEL NODE BIOPSY: SHX6098

## 2020-03-23 LAB — BASIC METABOLIC PANEL
Anion gap: 9 (ref 5–15)
BUN: 13 mg/dL (ref 8–23)
CO2: 27 mmol/L (ref 22–32)
Calcium: 9.2 mg/dL (ref 8.9–10.3)
Chloride: 103 mmol/L (ref 98–111)
Creatinine, Ser: 0.88 mg/dL (ref 0.44–1.00)
GFR, Estimated: >60 mL/min (ref >60)
Glucose, Bld: 121 mg/dL — ABNORMAL HIGH (ref 70–99)
Potassium: 4.1 mmol/L (ref 3.5–5.1)
Sodium: 139 mmol/L (ref 135–145)

## 2020-03-23 SURGERY — SIMPLE MASTECTOMY
Anesthesia: General | Site: Breast | Laterality: Left

## 2020-03-23 MED ORDER — LIDOCAINE HCL (CARDIAC) PF 100 MG/5ML IV SOSY
PREFILLED_SYRINGE | INTRAVENOUS | Status: DC | PRN
  Administered 2020-03-23: 75 mg via INTRAVENOUS

## 2020-03-23 MED ORDER — HYDROMORPHONE HCL 1 MG/ML IJ SOLN: 0.2500 mg | INTRAMUSCULAR | Status: DC | PRN

## 2020-03-23 MED ORDER — EPHEDRINE 5 MG/ML INJ
INTRAVENOUS | Status: AC
  Filled 2020-03-23: qty 10

## 2020-03-23 MED ORDER — CEFAZOLIN SODIUM-DEXTROSE 2-4 GM/100ML-% IV SOLN
INTRAVENOUS | Status: AC
  Filled 2020-03-23: qty 100

## 2020-03-23 MED ORDER — PHENYLEPHRINE 40 MCG/ML (10ML) SYRINGE FOR IV PUSH (FOR BLOOD PRESSURE SUPPORT)
PREFILLED_SYRINGE | INTRAVENOUS | Status: AC
  Filled 2020-03-23: qty 10

## 2020-03-23 MED ORDER — EPHEDRINE SULFATE 50 MG/ML IJ SOLN
INTRAMUSCULAR | Status: DC | PRN
  Administered 2020-03-23: 10 mg via INTRAVENOUS
  Administered 2020-03-23: 15 mg via INTRAVENOUS
  Administered 2020-03-23: 10 mg via INTRAVENOUS
  Administered 2020-03-23: 15 mg via INTRAVENOUS

## 2020-03-23 MED ORDER — TRAMADOL HCL 50 MG PO TABS: 50.0000 mg | ORAL_TABLET | Freq: Four times a day (QID) | ORAL | Status: DC | PRN

## 2020-03-23 MED ORDER — DROPERIDOL 2.5 MG/ML IJ SOLN
INTRAMUSCULAR | Status: AC
  Filled 2020-03-23: qty 2

## 2020-03-23 MED ORDER — METOPROLOL SUCCINATE 12.5 MG HALF TABLET: 12.5000 mg | ORAL_TABLET | Freq: Every day | ORAL | Status: DC

## 2020-03-23 MED ORDER — CHLORHEXIDINE GLUCONATE 4 % EX LIQD: 60.0000 mL | Freq: Once | CUTANEOUS | Status: DC

## 2020-03-23 MED ORDER — DEXAMETHASONE SODIUM PHOSPHATE 4 MG/ML IJ SOLN
INTRAMUSCULAR | Status: DC | PRN
  Administered 2020-03-23: 5 mg via INTRAVENOUS

## 2020-03-23 MED ORDER — ONDANSETRON HCL 4 MG/2ML IJ SOLN
INTRAMUSCULAR | Status: DC | PRN
  Administered 2020-03-23: 4 mg via INTRAVENOUS

## 2020-03-23 MED ORDER — LACTATED RINGERS IV SOLN: INTRAVENOUS | Status: DC

## 2020-03-23 MED ORDER — OXYCODONE HCL 5 MG PO TABS: 5.0000 mg | ORAL_TABLET | Freq: Once | ORAL | Status: DC | PRN

## 2020-03-23 MED ORDER — PROMETHAZINE HCL 25 MG/ML IJ SOLN: 6.2500 mg | INTRAMUSCULAR | Status: DC | PRN

## 2020-03-23 MED ORDER — ONDANSETRON 4 MG PO TBDP: 4.0000 mg | ORAL_TABLET | Freq: Four times a day (QID) | ORAL | Status: DC | PRN

## 2020-03-23 MED ORDER — ACETAMINOPHEN 500 MG PO TABS
ORAL_TABLET | ORAL | Status: AC
  Filled 2020-03-23: qty 2

## 2020-03-23 MED ORDER — DROPERIDOL 2.5 MG/ML IJ SOLN
INTRAMUSCULAR | Status: DC | PRN
  Administered 2020-03-23: .625 mg via INTRAVENOUS

## 2020-03-23 MED ORDER — MIDAZOLAM HCL 2 MG/2ML IJ SOLN
INTRAMUSCULAR | Status: AC
  Filled 2020-03-23: qty 2

## 2020-03-23 MED ORDER — PROPOFOL 10 MG/ML IV BOLUS
INTRAVENOUS | Status: AC
  Filled 2020-03-23: qty 20

## 2020-03-23 MED ORDER — FUROSEMIDE 40 MG PO TABS
40.0000 mg | ORAL_TABLET | Freq: Two times a day (BID) | ORAL | Status: DC
  Filled 2020-03-23 (×2): qty 1

## 2020-03-23 MED ORDER — MORPHINE SULFATE (PF) 4 MG/ML IV SOLN: 1.0000 mg | INTRAVENOUS | Status: DC | PRN

## 2020-03-23 MED ORDER — ONDANSETRON HCL 4 MG/2ML IJ SOLN
INTRAMUSCULAR | Status: AC
  Filled 2020-03-23: qty 2

## 2020-03-23 MED ORDER — PROPOFOL 10 MG/ML IV BOLUS
INTRAVENOUS | Status: DC | PRN
  Administered 2020-03-23: 110 mg via INTRAVENOUS

## 2020-03-23 MED ORDER — OXYCODONE HCL 5 MG/5ML PO SOLN: 5.0000 mg | Freq: Once | ORAL | Status: DC | PRN

## 2020-03-23 MED ORDER — ACETAMINOPHEN 500 MG PO TABS
1000.0000 mg | ORAL_TABLET | Freq: Two times a day (BID) | ORAL | Status: DC
  Administered 2020-03-24: 1000 mg via ORAL
  Filled 2020-03-23: qty 2

## 2020-03-23 MED ORDER — LIDOCAINE 2% (20 MG/ML) 5 ML SYRINGE
INTRAMUSCULAR | Status: AC
  Filled 2020-03-23: qty 5

## 2020-03-23 MED ORDER — ONDANSETRON HCL 4 MG/2ML IJ SOLN: 4.0000 mg | Freq: Four times a day (QID) | INTRAMUSCULAR | Status: DC | PRN

## 2020-03-23 MED ORDER — KCL IN DEXTROSE-NACL 20-5-0.45 MEQ/L-%-% IV SOLN
INTRAVENOUS | Status: DC
  Filled 2020-03-23: qty 1000

## 2020-03-23 MED ORDER — CEFAZOLIN SODIUM-DEXTROSE 2-4 GM/100ML-% IV SOLN
2.0000 g | INTRAVENOUS | Status: AC
  Administered 2020-03-23: 2 g via INTRAVENOUS

## 2020-03-23 MED ORDER — FENTANYL CITRATE (PF) 100 MCG/2ML IJ SOLN
INTRAMUSCULAR | Status: DC | PRN
  Administered 2020-03-23 (×2): 25 ug via INTRAVENOUS

## 2020-03-23 MED ORDER — BUPIVACAINE HCL 0.25 % IJ SOLN
INTRAMUSCULAR | Status: DC | PRN
  Administered 2020-03-23: 30 mL

## 2020-03-23 MED ORDER — ACETAMINOPHEN 500 MG PO TABS
1000.0000 mg | ORAL_TABLET | ORAL | Status: AC
  Administered 2020-03-23: 1000 mg via ORAL

## 2020-03-23 MED ORDER — FENTANYL CITRATE (PF) 100 MCG/2ML IJ SOLN
INTRAMUSCULAR | Status: AC
  Filled 2020-03-23: qty 2

## 2020-03-23 MED ORDER — PHENYLEPHRINE HCL (PRESSORS) 10 MG/ML IV SOLN
INTRAVENOUS | Status: DC | PRN
  Administered 2020-03-23: 80 ug via INTRAVENOUS

## 2020-03-23 SURGICAL SUPPLY — 58 items
APPLIER CLIP 9.375 MED OPEN (MISCELLANEOUS) ×3
BINDER BREAST XLRG (GAUZE/BANDAGES/DRESSINGS) ×3 IMPLANT
BINDER BREAST XXLRG (GAUZE/BANDAGES/DRESSINGS) IMPLANT
BIOPATCH RED 1 DISK 7.0 (GAUZE/BANDAGES/DRESSINGS) ×2 IMPLANT
BIOPATCH RED 1IN DISK 7.0MM (GAUZE/BANDAGES/DRESSINGS) ×1
BLADE SURG 10 STRL SS (BLADE) ×3 IMPLANT
BNDG ELASTIC 6X5.8 VLCR STR LF (GAUZE/BANDAGES/DRESSINGS) IMPLANT
CANISTER SUCT 1200ML W/VALVE (MISCELLANEOUS) ×3 IMPLANT
CHLORAPREP W/TINT 26 (MISCELLANEOUS) ×3 IMPLANT
CLIP APPLIE 9.375 MED OPEN (MISCELLANEOUS) ×1 IMPLANT
CLOSURE WOUND 1/2 X4 (GAUZE/BANDAGES/DRESSINGS)
COVER BACK TABLE 60X90IN (DRAPES) ×3 IMPLANT
COVER MAYO STAND STRL (DRAPES) ×3 IMPLANT
DERMABOND ADVANCED (GAUZE/BANDAGES/DRESSINGS) ×2
DERMABOND ADVANCED .7 DNX12 (GAUZE/BANDAGES/DRESSINGS) ×1 IMPLANT
DRAIN CHANNEL 19F RND (DRAIN) ×3 IMPLANT
DRAPE LAPAROSCOPIC ABDOMINAL (DRAPES) ×3 IMPLANT
DRAPE UTILITY XL STRL (DRAPES) ×3 IMPLANT
DRSG PAD ABDOMINAL 8X10 ST (GAUZE/BANDAGES/DRESSINGS) ×3 IMPLANT
DRSG TEGADERM 2-3/8X2-3/4 SM (GAUZE/BANDAGES/DRESSINGS) ×3 IMPLANT
ELECT REM PT RETURN 9FT ADLT (ELECTROSURGICAL) ×3
ELECTRODE REM PT RTRN 9FT ADLT (ELECTROSURGICAL) ×1 IMPLANT
EVACUATOR SILICONE 100CC (DRAIN) ×3 IMPLANT
GAUZE SPONGE 4X4 12PLY STRL (GAUZE/BANDAGES/DRESSINGS) ×3 IMPLANT
GLOVE BIO SURGEON STRL SZ 6.5 (GLOVE) ×2 IMPLANT
GLOVE BIO SURGEON STRL SZ7.5 (GLOVE) ×3 IMPLANT
GLOVE BIO SURGEONS STRL SZ 6.5 (GLOVE) ×1
GLOVE BIOGEL PI IND STRL 6.5 (GLOVE) ×1 IMPLANT
GLOVE BIOGEL PI IND STRL 7.5 (GLOVE) ×1 IMPLANT
GLOVE BIOGEL PI INDICATOR 6.5 (GLOVE) ×2
GLOVE BIOGEL PI INDICATOR 7.5 (GLOVE) ×2
GLOVE SURG SYN 7.5  E (GLOVE) ×3
GLOVE SURG SYN 7.5 E (GLOVE) ×1 IMPLANT
GOWN STRL REUS W/ TWL LRG LVL3 (GOWN DISPOSABLE) ×2 IMPLANT
GOWN STRL REUS W/ TWL XL LVL3 (GOWN DISPOSABLE) ×1 IMPLANT
GOWN STRL REUS W/TWL LRG LVL3 (GOWN DISPOSABLE) ×6
GOWN STRL REUS W/TWL XL LVL3 (GOWN DISPOSABLE) ×3
NEEDLE HYPO 25X1 1.5 SAFETY (NEEDLE) ×3 IMPLANT
NS IRRIG 1000ML POUR BTL (IV SOLUTION) ×3 IMPLANT
PACK BASIN DAY SURGERY FS (CUSTOM PROCEDURE TRAY) ×3 IMPLANT
PENCIL SMOKE EVACUATOR (MISCELLANEOUS) ×3 IMPLANT
PIN SAFETY STERILE (MISCELLANEOUS) ×3 IMPLANT
SHEET MEDIUM DRAPE 40X70 STRL (DRAPES) ×3 IMPLANT
SLEEVE SCD COMPRESS KNEE MED (MISCELLANEOUS) ×3 IMPLANT
SPONGE LAP 18X18 RF (DISPOSABLE) ×3 IMPLANT
STAPLER VISISTAT 35W (STAPLE) IMPLANT
STRIP CLOSURE SKIN 1/2X4 (GAUZE/BANDAGES/DRESSINGS) IMPLANT
SUT ETHILON 2 0 FS 18 (SUTURE) ×3 IMPLANT
SUT MNCRL AB 4-0 PS2 18 (SUTURE) ×3 IMPLANT
SUT SILK 2 0 SH (SUTURE) ×3 IMPLANT
SUT VIC AB 3-0 SH 27 (SUTURE)
SUT VIC AB 3-0 SH 27X BRD (SUTURE) IMPLANT
SUT VICRYL 3-0 CR8 SH (SUTURE) ×3 IMPLANT
SYR CONTROL 10ML LL (SYRINGE) ×3 IMPLANT
TOWEL GREEN STERILE FF (TOWEL DISPOSABLE) ×3 IMPLANT
TUBE CONNECTING 20'X1/4 (TUBING) ×1
TUBE CONNECTING 20X1/4 (TUBING) ×2 IMPLANT
YANKAUER SUCT BULB TIP NO VENT (SUCTIONS) ×3 IMPLANT

## 2020-03-23 NOTE — Op Note (Signed)
03/23/2020  1:18 PM  PATIENT:  [name], 86 y.o., female, MRN: [id number]  PREOP DIAGNOSIS:  LEFT BREAST CANCER  POSTOP DIAGNOSIS:   Left breast cancer, 2 o'clock position (T2, N0)  PROCEDURE:   Procedure(s):    LEFT SIMPLE MASTECTOMY  SURGEON:   Alphonsa Overall, M.D.  ASSISTANT:    None  ANESTHESIA:  General  Anesthesiologist: Lynda Rainwater, MD CRNA: Bufford Spikes, CRNA  General  ASA:  3  EBL:  100  ml  BLOOD ADMINISTERED: none  DRAINS: One 72 F Blake drain  LOCAL MEDICATIONS USED:   I used 30 cc of 1/4% marcaine for a left pectoral block  SPECIMEN:   Left breast (long suture lateral), superior mastectomy skin flap (long suture lateral, short suture superior)  COUNTS CORRECT:  YES  INDICATIONS FOR PROCEDURE:  [name] is a 86 y.o. (DOB: 03-01-1928) white female whose primary care physician is Tisovec, Fransico Him, MD and comes for left simple mastectomy   She had a left breast cancer in 1998.  She had evidence of a recurrent left breast cancer on mammography on 12/11/2019.  The pathology showed ER positive cancer.  She tried antihormone therapy with anastrazole by Dr. Lindi Adie, but there is clinical evidence of continued enlargement of the left breast cancer.  She now comes for left mastectomy.   The indications and risks of the surgery were explained to the patient.  The risks include, but are not limited to, infection, bleeding, and nerve injury.  OPERATIVE NOTE;  The patient was taken to OR room # 5 at [facility] where she underwent a general anesthesia  supervised by Anesthesiologist: Lynda Rainwater, MD CRNA: Bufford Spikes, CRNA. Her left breast and axilla were prepped with ChloraPrep and sterilely draped.    A time-out and the surgical check list was reviewed.    I made an elliptical incision including the areola in the left breast.  I developed skin flaps medially to the lateral edge of the sternum, inferiorly to the investing fascia  of the rectus abdominus muscle, laterally to the anterior edge of the latissimus dorsi muscle, and superiorly to about 2 finger breaths below the clavicle.  The breast was reflected off the pectoralis muscle from medial to lateral.  The lateral attachments in the left axilla were divided and the breast removed.  A long suture was placed on the lateral aspect of the breast.   Of note, her left breast was firm, consistent with the prior radiation therapy.  In the upper breast, there was a question of whether the cancer went to the anterior margin of the breast, and thus could possible involve the skin.  I excised the superior mastectomy skin flap to specifically remove this area of the skin that lay over the cancer.  I marked the superior skin flap with a short suture inferiorly and a long suture laterally.   I brought out one 72 F Blake drain below the inferior flaps.  This was sewn in place with 2-0 Nylon.  I irrigated the wound with 1,000 cc of fluid.   The skin was closed with interrupted 3-0 Vicryl sutures and the skin was closed with a 4-0 Monocryl.  The wound was painted with Dermabond.    A pressure dressing was placed on the wound and the chest wrapped with a breast binder.  Her needle and sponge count were correct at the end of the case.   She was transferred to the recovery  room in good condition.  Alphonsa Overall, MD, [facility] Pager: [phone] Office phone:  [phone]

## 2020-03-23 NOTE — Interval H&P Note (Signed)
History and Physical Interval Note:  03/23/2020 11:05 AM  [name]  has presented today for surgery, with the diagnosis of LEFT BREAST CANCER.  The various methods of treatment have been discussed with the patient and family.   Her daughter is here with her.  After consideration of risks, benefits and other options for treatment, the patient has consented to  Procedure(s): LEFT SIMPLE MASTECTOMY (Left) as a surgical intervention.  The patient's history has been reviewed, patient examined, no change in status, stable for surgery.  I have reviewed the patient's chart and labs.  Questions were answered to the patient's satisfaction.     Shann Medal

## 2020-03-23 NOTE — Anesthesia Postprocedure Evaluation (Signed)
Anesthesia Post Note  Patient: [name]  Procedure(s) Performed: LEFT SIMPLE MASTECTOMY (Left Breast)     Patient location during evaluation: PACU Anesthesia Type: General Level of consciousness: awake and alert Pain management: pain level controlled Vital Signs Assessment: post-procedure vital signs reviewed and stable Respiratory status: spontaneous breathing, nonlabored ventilation and respiratory function stable Cardiovascular status: blood pressure returned to baseline and stable Postop Assessment: no apparent nausea or vomiting Anesthetic complications: no   No complications documented.  Last Vitals:  Vitals:   03/23/20 1345 03/23/20 1400  BP: (!) 124/52 (!) 120/56  Pulse: 76 77  Resp: 14 13  Temp:    SpO2: 100% 99%    Last Pain:  Vitals:   03/23/20 1400  TempSrc:   PainSc: 0-No pain                 Lynda Rainwater

## 2020-03-23 NOTE — Anesthesia Procedure Notes (Signed)
Procedure Name: LMA Insertion Date/Time: 03/23/2020 11:51 AM Performed by: Bufford Spikes, CRNA Pre-anesthesia Checklist: Patient identified, Emergency Drugs available, Suction available and Patient being monitored Patient Re-evaluated:Patient Re-evaluated prior to induction Oxygen Delivery Method: Circle system utilized Preoxygenation: Pre-oxygenation with 100% oxygen Induction Type: IV induction Ventilation: Mask ventilation without difficulty LMA: LMA inserted LMA Size: 4.0 Number of attempts: 1 Airway Equipment and Method: Bite block Placement Confirmation: positive ETCO2 Tube secured with: Tape Dental Injury: Teeth and Oropharynx as per pre-operative assessment

## 2020-03-23 NOTE — Anesthesia Preprocedure Evaluation (Signed)
Anesthesia Evaluation  Patient identified by MRN, date of birth, ID band Patient awake    Reviewed: Allergy & Precautions, H&P , NPO status , Patient's Chart, lab work & pertinent test results, reviewed documented beta blocker date and time   History of Anesthesia Complications (+) PONV  Airway Mallampati: II  TM Distance: >3 FB Neck ROM: Full    Dental no notable dental hx. (+) Teeth Intact, Dental Advisory Given   Pulmonary neg pulmonary ROS, former smoker,    Pulmonary exam normal breath sounds clear to auscultation       Cardiovascular hypertension, Pt. on medications and Pt. on home beta blockers + dysrhythmias Atrial Fibrillation  Rhythm:Irregular Rate:Normal     Neuro/Psych Anxiety Bells' Palsy negative psych ROS   GI/Hepatic negative GI ROS, Neg liver ROS,   Endo/Other  Hypothyroidism Morbid obesity  Renal/GU Renal disease  negative genitourinary   Musculoskeletal  (+) Arthritis , Osteoarthritis,    Abdominal   Peds negative pediatric ROS (+)  Hematology negative hematology ROS (+)   Anesthesia Other Findings Breast Cancer  Reproductive/Obstetrics negative OB ROS                             Anesthesia Physical  Anesthesia Plan  ASA: III  Anesthesia Plan: General   Post-op Pain Management:    Induction: Intravenous  PONV Risk Score and Plan: 4 or greater and Ondansetron, Dexamethasone, Midazolam, Droperidol and Treatment may vary due to age or medical condition  Airway Management Planned: LMA  Additional Equipment:   Intra-op Plan:   Post-operative Plan: Extubation in OR  Informed Consent: I have reviewed the patients History and Physical, chart, labs and discussed the procedure including the risks, benefits and alternatives for the proposed anesthesia with the patient or authorized representative who has indicated his/her understanding and acceptance.      Dental advisory given  Plan Discussed with: CRNA  Anesthesia Plan Comments:         Anesthesia Quick Evaluation

## 2020-03-23 NOTE — Transfer of Care (Signed)
Immediate Anesthesia Transfer of Care Note  Patient: [name]  Procedure(s) Performed: LEFT SIMPLE MASTECTOMY (Left Breast)  Patient Location: PACU  Anesthesia Type:General  Level of Consciousness: awake, alert  and oriented  Airway & Oxygen Therapy: Patient Spontanous Breathing and Patient connected to nasal cannula oxygen  Post-op Assessment: Report given to RN and Post -op Vital signs reviewed and stable  Post vital signs: Reviewed and stable  Last Vitals:  Vitals Value Taken Time  BP 126/62 03/23/20 1326  Temp    Pulse 80 03/23/20 1328  Resp 28 03/23/20 1328  SpO2 100 % 03/23/20 1328  Vitals shown include unvalidated device data.  Last Pain:  Vitals:   03/23/20 1110  TempSrc: Oral  PainSc: 0-No pain      Patients Stated Pain Goal: 3 (16/10/96 0454)  Complications: No complications documented.

## 2020-03-24 DIAGNOSIS — I10 Essential (primary) hypertension: Secondary | ICD-10-CM | POA: Diagnosis not present

## 2020-03-24 DIAGNOSIS — C50912 Malignant neoplasm of unspecified site of left female breast: Secondary | ICD-10-CM | POA: Diagnosis not present

## 2020-03-24 DIAGNOSIS — H539 Unspecified visual disturbance: Secondary | ICD-10-CM | POA: Diagnosis not present

## 2020-03-24 DIAGNOSIS — Z87891 Personal history of nicotine dependence: Secondary | ICD-10-CM | POA: Diagnosis not present

## 2020-03-24 DIAGNOSIS — H9190 Unspecified hearing loss, unspecified ear: Secondary | ICD-10-CM | POA: Diagnosis not present

## 2020-03-24 DIAGNOSIS — I4891 Unspecified atrial fibrillation: Secondary | ICD-10-CM | POA: Diagnosis not present

## 2020-03-24 DIAGNOSIS — Z79899 Other long term (current) drug therapy: Secondary | ICD-10-CM | POA: Diagnosis not present

## 2020-03-24 MED ORDER — TRAMADOL HCL 50 MG PO TABS: 50.0000 mg | ORAL_TABLET | Freq: Four times a day (QID) | ORAL | 0 refills | Status: DC | PRN

## 2020-03-24 NOTE — Discharge Summary (Signed)
Physician Discharge Summary  Patient ID:  [name]  MRN: [id number]  DOB/AGE: 1927-12-04 86 y.o.  Admit date: 03/23/2020 Discharge date: 03/24/2020  Discharge Diagnoses:  1.  BREAST CANCER, STAGE 2, LEFT              Story: Left breast biopsy on 12/24/2019 ([id number]) shows IDC, grade 2, ER - 95%, PR - 90%, Ki67 - 15%, Her2Neu - neg             Oncology - [place] [facility] [place]  2. Left breast cancer Treated with lumpectomy and left axillary node dissection in [place].  3.  HARD OF HEARING 4. Poor vision 5. A fib - she did see Dr. Veatrice Bourbon - but she is not in A. Fib and not seeing cardiology. Doing well on the metoprolol Now followed by Dr. Osborne Casco 6. Thyroid replacement 7. Arthritis 8. History of recto-sigmoid colon cancer - 07/17/2000 - [facility]   Active Problems:   Breast cancer, stage 1, estrogen receptor positive, left ([place])  Operation: Procedure(s):   LEFT SIMPLE MASTECTOMY on 03/23/2020 [name]  Discharged Condition: good  Hospital Course: [name] is an 86 y.o. female whose primary care physician is Tisovec, Fransico Him, MD and who was admitted 03/23/2020 with a chief complaint of No chief complaint on file. Marland Kitchen   She was brought to the operating room on 03/23/2020 and underwent LEFT SIMPLE MASTECTOMY.   She has done well and is ready to go home. Her daughter, Pamala Hurry, is here with her to go over the questions.  The discharge instructions were reviewed with the patient.  Consults: None  Significant Diagnostic Studies: Results for orders placed or performed during the hospital encounter of 16/38/46  Basic metabolic panel per protocol  Result Value Ref Range   Sodium 139 135 - 145 mmol/L   Potassium 4.1 3.5 - 5.1 mmol/L   Chloride 103 98 - 111 mmol/L   CO2 27 22 - 32 mmol/L   Glucose, Bld 121 (H) 70 - 99 mg/dL   BUN 13 8 - 23 mg/dL   Creatinine, Ser 0.88 0.44 - 1.00 mg/dL   Calcium 9.2 8.9 - 10.3 mg/dL    GFR, Estimated >60 >60 mL/min   Anion gap 9 5 - 15    No results found.  Discharge Exam:  Vitals:   03/24/20 0500 03/24/20 0600  BP:  124/62  Pulse: 69 82  Resp:  16  Temp:  (!) 97.5 F (36.4 C)  SpO2: 94% 96%    General: Older WF who is alert and generally healthy appearing.  She can not hear and her daughter acts as Conservation officer, historic buildings".  Chest:  The wound looks good.  Drain -  65 cc recorded Extremities:  Good strength and ROM  in upper and lower extremities.   Discharge Medications:   Allergies as of 03/24/2020      Reactions   Codeine Nausea And Vomiting   Sulfa Antibiotics Nausea Only      Medication List    TAKE these medications   acetaminophen 500 MG tablet Commonly known as: TYLENOL Take 1,000 mg by mouth 2 (two) times daily. Mainly just two at night.   anastrozole 1 MG tablet Commonly known as: ARIMIDEX Take 1 tablet (1 mg total) by mouth daily.   diphenhydrAMINE 12.5 MG/5ML syrup Commonly known as: BENYLIN Take 10 mLs (25 mg total) by mouth every 4 (four) hours as needed for itching or allergies.   furosemide 40  MG tablet Commonly known as: LASIX TAKE 1 TABLET (40 MG TOTAL) BY MOUTH 2 (TWO) TIMES DAILY.   levothyroxine 50 MCG tablet Commonly known as: SYNTHROID Take 50 mcg by mouth daily.   metoprolol succinate 25 MG 24 hr tablet Commonly known as: TOPROL-XL TAKE 1/2 TABLET BY MOUTH EVERY DAY (TAKE WITH A MEAL OR IMMEDIATELY FOLLOWING A MEAL)   OCUVITE EYE HEATLH GUMMIES PO Take 2 tablets by mouth daily.   potassium chloride SA 20 MEQ tablet Commonly known as: Klor-Con M20 Take 2 tablets (40 mEq total) by mouth 2 (two) times daily.   SYSTANE OP Place 1 drop into both eyes at bedtime.   traMADol 50 MG tablet Commonly known as: ULTRAM Take 1 tablet (50 mg total) by mouth every 6 (six) hours as needed for moderate pain or severe pain (mild pain).   VITAMIN D PO Take 1 tablet by mouth daily.       Disposition: Discharge disposition: 01-Home  or Self Care       Discharge Instructions    Diet - low sodium heart healthy   Complete by: As directed    Increase activity slowly   Complete by: As directed          Signed: Alphonsa Overall, M.D., [facility] Office:  [phone]  03/24/2020, 7:56 AM

## 2020-03-24 NOTE — Discharge Instructions (Signed)
[facility] - DISCHARGE INSTRUCTIONS TO PATIENT  Activity:  Lifting - No lifting more than 15 pounds for one week, then no limit                       Practice your Covid-19 protection:  Wear a mask, social distance, and wash your hands frequently  Wound Care:   Leave the incision dry for 2 days, then you may shower  Diet:  As tolerated  Follow up appointment:  Call Dr. Pollie Friar office [facility]) at [phone] for an appointment in 1 week.  Medications and dosages:  Resume your home medications.  You have a prescription for:  Ultram             You may also take Tylenol, ibuprofen, or Aleve for pain  Call Dr. [name] or his office  [phone]) if you have:  Temperature greater than 100.4,  Persistent nausea and vomiting,  Severe uncontrolled pain,  Redness, tenderness, or signs of infection (pain, swelling, redness, odor or green/yellow discharge around the site),  Difficulty breathing, headache or visual disturbances,  Any other questions or concerns you may have after discharge.  In an emergency, call 911 or go to an Emergency Department at a nearby hospital.   About my Jackson-Pratt Bulb Drain  What is a Jackson-Pratt bulb? A Jackson-Pratt is a soft, round device used to collect drainage. It is connected to a long, thin drainage catheter, which is held in place by one or two small stiches near your surgical incision site. When the bulb is squeezed, it forms a vacuum, forcing the drainage to empty into the bulb.  Emptying the Jackson-Pratt bulb- To empty the bulb: 1. Release the plug on the top of the bulb. 2. Pour the bulb's contents into a measuring container which your nurse will provide. 3. Record the time emptied and amount of drainage. Empty the drain(s) as often as your     doctor or nurse recommends.  Date                  Time                    Amount (Drain 1)                 Amount (Drain  2)  _____________________________________________________________________  _____________________________________________________________________  _____________________________________________________________________  _____________________________________________________________________  _____________________________________________________________________  _____________________________________________________________________  _____________________________________________________________________  _____________________________________________________________________  Squeezing the Jackson-Pratt Bulb- To squeeze the bulb: 1. Make sure the plug at the top of the bulb is open. 2. Squeeze the bulb tightly in your fist. You will hear air squeezing from the bulb. 3. Replace the plug while the bulb is squeezed. 4. Use a safety pin to attach the bulb to your clothing. This will keep the catheter from     pulling at the bulb insertion site.  When to call your doctor- Call your doctor if:  Drain site becomes red, swollen or hot.  You have a fever greater than 101 degrees F.  There is oozing at the drain site.  Drain falls out (apply a guaze bandage over the drain hole and secure it with tape).  Drainage increases daily not related to activity patterns. (You will usually have more drainage when you are active than when you are resting.)  Drainage has a bad odor.

## 2020-03-26 ENCOUNTER — Encounter (HOSPITAL_BASED_OUTPATIENT_CLINIC_OR_DEPARTMENT_OTHER): Payer: Self-pay | Admitting: Surgery

## 2020-03-29 ENCOUNTER — Encounter: Payer: Self-pay | Admitting: *Deleted

## 2020-03-29 LAB — SURGICAL PATHOLOGY

## 2020-03-30 NOTE — Progress Notes (Signed)
Patient Care Team: Tisovec, Fransico Him, MD as PCP - General (Internal Medicine) Rockwell Germany, RN as Oncology Nurse Navigator Mauro Kaufmann, RN as Oncology Nurse Navigator Alphonsa Overall, MD as Consulting Physician (General Surgery) Nicholas Lose, MD as Consulting Physician (Hematology and Oncology) Kyung Rudd, MD as Consulting Physician (Radiation Oncology)  DIAGNOSIS:    ICD-10-CM   1. Malignant neoplasm of upper-outer quadrant of left breast in female, estrogen receptor positive ([place])  C50.412    Z17.0     SUMMARY OF ONCOLOGIC HISTORY: Oncology History  Malignant neoplasm of upper-outer quadrant of left breast in female, estrogen receptor positive ([place])  1998 Cancer Diagnosis   History of left breast cancer in 1998 for which she underwent a lumpectomy and radiation   12/29/2019 Initial Diagnosis   Patient noted enlargement of the left breast. Mammogram showed a 3.3cm left breast mass at the 12 o'clock position.  Biopsy revealed grade 2 IDC ER 95%, PR 90%, Ki-67 15%, HER-2 negative   12/31/2019 Cancer Staging   Staging form: Breast, AJCC 8th Edition - Clinical stage from 12/31/2019: Stage IB (cT2, cN0, cM0, G2, ER+, PR+, HER2-) - Signed by Nicholas Lose, MD on 12/31/2019   03/23/2020 Surgery   Left mastectomy [name]): IDC, grade 3, 2.5cm, clear margins     CHIEF COMPLIANT: Follow-up s/p left mastectomy  INTERVAL HISTORY: [name] is a 86 y.o. with above-mentioned history of recurrent left breast cancer. She underwent a left mastectomy on 03/23/20 with Dr. Lucia [name] for which pathology showed invasive ductal carcinoma, grade 3, 2.5cm, clear margins. She presents to the clinic today for follow-up.   ALLERGIES:  is allergic to codeine and sulfa antibiotics.  MEDICATIONS:  Current Outpatient Medications  Medication Sig Dispense Refill  . acetaminophen (TYLENOL) 500 MG tablet Take 1,000 mg by mouth 2 (two) times daily. Mainly just two at night.    Marland Kitchen anastrozole  (ARIMIDEX) 1 MG tablet Take 1 tablet (1 mg total) by mouth daily. 90 tablet 3  . Cholecalciferol (VITAMIN D PO) Take 1 tablet by mouth daily.    . diphenhydrAMINE (BENYLIN) 12.5 MG/5ML syrup Take 10 mLs (25 mg total) by mouth every 4 (four) hours as needed for itching or allergies. 120 mL 0  . furosemide (LASIX) 40 MG tablet TAKE 1 TABLET (40 MG TOTAL) BY MOUTH 2 (TWO) TIMES DAILY. 30 tablet 0  . levothyroxine (SYNTHROID, LEVOTHROID) 50 MCG tablet Take 50 mcg by mouth daily.  5  . metoprolol succinate (TOPROL-XL) 25 MG 24 hr tablet TAKE 1/2 TABLET BY MOUTH EVERY DAY (TAKE WITH A MEAL OR IMMEDIATELY FOLLOWING A MEAL) 45 tablet 2  . Multiple Vitamins-Minerals (OCUVITE EYE HEATLH GUMMIES PO) Take 2 tablets by mouth daily.    [name] Glycol-Propyl Glycol (SYSTANE OP) Place 1 drop into both eyes at bedtime.    . potassium chloride SA (KLOR-CON M20) 20 MEQ tablet Take 2 tablets (40 mEq total) by mouth 2 (two) times daily. 360 tablet 3   No current facility-administered medications for this visit.    PHYSICAL EXAMINATION: ECOG PERFORMANCE STATUS: 2 - Symptomatic, <50% confined to bed  Vitals:   03/31/20 0907  BP: 129/75  Pulse: 70  Resp: 17  Temp: 97.7 F (36.5 C)  SpO2: 100%   Filed Weights   03/31/20 0907  Weight: 168 lb 3.2 oz (76.3 kg)    LABORATORY DATA:  I have reviewed the data as listed CMP Latest Ref Rng & Units 03/23/2020 12/31/2019 10/30/2014  Glucose 70 -  99 mg/dL 121(H) 113(H) 71  BUN 8 - 23 mg/dL 13 19 29(H)  Creatinine 0.44 - 1.00 mg/dL 0.88 0.94 1.18(H)  Sodium 135 - 145 mmol/L 139 142 141  Potassium 3.5 - 5.1 mmol/L 4.1 4.0 4.7  Chloride 98 - 111 mmol/L 103 104 99  CO2 22 - 32 mmol/L _0 Calcium 8.9 - 10.3 mg/dL 9.2 10.1 9.0  Total Protein 6.5 - 8.1 g/dL - 7.2 -  Total Bilirubin 0.3 - 1.2 mg/dL - 1.4(H) -  Alkaline Phos 38 - 126 U/L - 67 -  AST 15 - 41 U/L - 14(L) -  ALT 0 - 44 U/L - 7 -    Lab Results  Component Value Date   WBC 6.7 12/31/2019   HGB  13.0 12/31/2019   HCT 38.9 12/31/2019   MCV 93.1 12/31/2019   PLT 174 12/31/2019   NEUTROABS 4.3 12/31/2019    ASSESSMENT & PLAN:  Malignant neoplasm of upper-outer quadrant of left breast in female, estrogen receptor positive ([place]) 03/23/20: Left mastectomy [name]): IDC, grade 3, 2.5cm, clear margins, ER 95%, PR 90%, Ki-67 15%, HER-2 negative  Pathology counseling: I discussed the final pathology report of the patient provided  a copy of this report. I discussed the margins as well as lymph node surgeries. We also discussed the final staging along with previously performed ER/PR and HER-2/neu testing.  Treatment Plan: continue Anastrozole Return to clinic in 6 months for follow-up  No orders of the defined types were placed in this encounter.  The patient has a good understanding of the overall plan. she agrees with it. she will call with any problems that may develop before the next visit here.  Total time spent: 20 mins including face to face time and time spent for planning, charting and coordination of care  Nicholas Lose, MD 03/31/2020  I, Cloyde Reams Dorshimer, am acting as scribe for Dr. Nicholas Lose.  I have reviewed the above documentation for accuracy and completeness, and I agree with the above.

## 2020-03-30 NOTE — Assessment & Plan Note (Signed)
03/23/20: Left mastectomy [name]): IDC, grade 3, 2.5cm, clear margins, ER 95%, PR 90%, Ki-67 15%, HER-2 negative  Pathology counseling: I discussed the final pathology report of the patient provided  a copy of this report. I discussed the margins as well as lymph node surgeries. We also discussed the final staging along with previously performed ER/PR and HER-2/neu testing.  Treatment Plan: continue Anastrozole

## 2020-03-31 ENCOUNTER — Inpatient Hospital Stay: Payer: Medicare PPO | Attending: Hematology and Oncology | Admitting: Hematology and Oncology

## 2020-03-31 ENCOUNTER — Ambulatory Visit: Payer: Medicare PPO | Admitting: Hematology and Oncology

## 2020-03-31 ENCOUNTER — Other Ambulatory Visit: Payer: Self-pay

## 2020-03-31 DIAGNOSIS — Z79899 Other long term (current) drug therapy: Secondary | ICD-10-CM | POA: Diagnosis not present

## 2020-03-31 DIAGNOSIS — Z170 Estrogen receptor positive status [ER+]: Secondary | ICD-10-CM | POA: Diagnosis not present

## 2020-03-31 DIAGNOSIS — C50412 Malignant neoplasm of upper-outer quadrant of left female breast: Secondary | ICD-10-CM | POA: Insufficient documentation

## 2020-03-31 DIAGNOSIS — Z9012 Acquired absence of left breast and nipple: Secondary | ICD-10-CM | POA: Insufficient documentation

## 2020-03-31 DIAGNOSIS — Z79811 Long term (current) use of aromatase inhibitors: Secondary | ICD-10-CM | POA: Insufficient documentation

## 2020-04-01 ENCOUNTER — Encounter: Payer: Self-pay | Admitting: *Deleted

## 2020-05-13 DIAGNOSIS — I130 Hypertensive heart and chronic kidney disease with heart failure and stage 1 through stage 4 chronic kidney disease, or unspecified chronic kidney disease: Secondary | ICD-10-CM | POA: Diagnosis not present

## 2020-05-13 DIAGNOSIS — I509 Heart failure, unspecified: Secondary | ICD-10-CM | POA: Diagnosis not present

## 2020-05-13 DIAGNOSIS — I890 Lymphedema, not elsewhere classified: Secondary | ICD-10-CM | POA: Diagnosis not present

## 2020-05-13 DIAGNOSIS — N1831 Chronic kidney disease, stage 3a: Secondary | ICD-10-CM | POA: Diagnosis not present

## 2020-05-13 DIAGNOSIS — M1990 Unspecified osteoarthritis, unspecified site: Secondary | ICD-10-CM | POA: Diagnosis not present

## 2020-05-13 DIAGNOSIS — C50912 Malignant neoplasm of unspecified site of left female breast: Secondary | ICD-10-CM | POA: Diagnosis not present

## 2020-05-13 DIAGNOSIS — H900 Conductive hearing loss, bilateral: Secondary | ICD-10-CM | POA: Diagnosis not present

## 2020-05-13 DIAGNOSIS — E039 Hypothyroidism, unspecified: Secondary | ICD-10-CM | POA: Diagnosis not present

## 2020-05-13 DIAGNOSIS — Z170 Estrogen receptor positive status [ER+]: Secondary | ICD-10-CM | POA: Diagnosis not present

## 2020-05-13 DIAGNOSIS — R7302 Impaired glucose tolerance (oral): Secondary | ICD-10-CM | POA: Diagnosis not present

## 2020-08-05 ENCOUNTER — Encounter: Payer: Self-pay | Admitting: Hematology and Oncology

## 2020-08-12 DIAGNOSIS — H52203 Unspecified astigmatism, bilateral: Secondary | ICD-10-CM | POA: Diagnosis not present

## 2020-08-12 DIAGNOSIS — H524 Presbyopia: Secondary | ICD-10-CM | POA: Diagnosis not present

## 2020-08-12 DIAGNOSIS — H353132 Nonexudative age-related macular degeneration, bilateral, intermediate dry stage: Secondary | ICD-10-CM | POA: Diagnosis not present

## 2020-08-12 DIAGNOSIS — D3132 Benign neoplasm of left choroid: Secondary | ICD-10-CM | POA: Diagnosis not present

## 2020-08-12 DIAGNOSIS — Z961 Presence of intraocular lens: Secondary | ICD-10-CM | POA: Diagnosis not present

## 2020-08-17 DIAGNOSIS — Z85828 Personal history of other malignant neoplasm of skin: Secondary | ICD-10-CM | POA: Diagnosis not present

## 2020-08-17 DIAGNOSIS — I872 Venous insufficiency (chronic) (peripheral): Secondary | ICD-10-CM | POA: Diagnosis not present

## 2020-08-17 DIAGNOSIS — L814 Other melanin hyperpigmentation: Secondary | ICD-10-CM | POA: Diagnosis not present

## 2020-08-17 DIAGNOSIS — I8311 Varicose veins of right lower extremity with inflammation: Secondary | ICD-10-CM | POA: Diagnosis not present

## 2020-08-17 DIAGNOSIS — L603 Nail dystrophy: Secondary | ICD-10-CM | POA: Diagnosis not present

## 2020-08-17 DIAGNOSIS — L821 Other seborrheic keratosis: Secondary | ICD-10-CM | POA: Diagnosis not present

## 2020-08-17 DIAGNOSIS — I8312 Varicose veins of left lower extremity with inflammation: Secondary | ICD-10-CM | POA: Diagnosis not present

## 2020-08-17 DIAGNOSIS — C44612 Basal cell carcinoma of skin of right upper limb, including shoulder: Secondary | ICD-10-CM | POA: Diagnosis not present

## 2020-08-17 DIAGNOSIS — D485 Neoplasm of uncertain behavior of skin: Secondary | ICD-10-CM | POA: Diagnosis not present

## 2020-08-17 DIAGNOSIS — D225 Melanocytic nevi of trunk: Secondary | ICD-10-CM | POA: Diagnosis not present

## 2020-09-28 NOTE — Progress Notes (Signed)
Patient Care Team: Tisovec, Fransico Him, MD as PCP - General (Internal Medicine) Rockwell Germany, RN as Oncology Nurse Navigator Mauro Kaufmann, RN as Oncology Nurse Navigator Alphonsa Overall, MD as Consulting Physician (General Surgery) Nicholas Lose, MD as Consulting Physician (Hematology and Oncology) Kyung Rudd, MD as Consulting Physician (Radiation Oncology)  DIAGNOSIS:    ICD-10-CM   1. Malignant neoplasm of upper-outer quadrant of left breast in female, estrogen receptor positive ([place])  C50.412    Z17.0     SUMMARY OF ONCOLOGIC HISTORY: Oncology History  Malignant neoplasm of upper-outer quadrant of left breast in female, estrogen receptor positive ([place])  1998 Cancer Diagnosis   History of left breast cancer in 1998 for which she underwent a lumpectomy and radiation   12/29/2019 Initial Diagnosis   Patient noted enlargement of the left breast. Mammogram showed a 3.3cm left breast mass at the 12 o'clock position.  Biopsy revealed grade 2 IDC ER 95%, PR 90%, Ki-67 15%, HER-2 negative   12/31/2019 Cancer Staging   Staging form: Breast, AJCC 8th Edition - Clinical stage from 12/31/2019: Stage IB (cT2, cN0, cM0, G2, ER+, PR+, HER2-) - Signed by Nicholas Lose, MD on 12/31/2019   12/31/2019 -  Anti-estrogen oral therapy   Anastrozole    03/23/2020 Surgery   Left mastectomy [name]): IDC, grade 3, 2.5cm, clear margins     CHIEF COMPLIANT: Follow-up of left breast cancer on anastrozole   INTERVAL HISTORY: [name] is a 87 y.o. with above-mentioned history of recurrent left breast cancer who underwent a left mastectomy and is currently on antiestrogen therapy with anastrozole. She presents to the clinic todayfor follow-up. She is extremely hard of hearing.  She reports no major problems tolerating anastrozole.  She does feel occasionally hot.  Denies any hot flashes.  Denies any worsening arthralgias or myalgias.  ALLERGIES:  is allergic to codeine and sulfa  antibiotics.  MEDICATIONS:  Current Outpatient Medications  Medication Sig Dispense Refill  . calcium carbonate (CALTRATE 600) 1500 (600 Ca) MG TABS tablet Take 1 tablet (1,500 mg total) by mouth daily with breakfast.  0  . acetaminophen (TYLENOL) 500 MG tablet Take 1,000 mg by mouth 2 (two) times daily. Mainly just two at night.    Marland Kitchen anastrozole (ARIMIDEX) 1 MG tablet Take 1 tablet (1 mg total) by mouth daily. 90 tablet 3  . diphenhydrAMINE (BENYLIN) 12.5 MG/5ML syrup Take 10 mLs (25 mg total) by mouth every 4 (four) hours as needed for itching or allergies. 120 mL 0  . furosemide (LASIX) 40 MG tablet TAKE 1 TABLET (40 MG TOTAL) BY MOUTH 2 (TWO) TIMES DAILY. 30 tablet 0  . levothyroxine (SYNTHROID, LEVOTHROID) 50 MCG tablet Take 50 mcg by mouth daily.  5  . metoprolol succinate (TOPROL-XL) 25 MG 24 hr tablet TAKE 1/2 TABLET BY MOUTH EVERY DAY (TAKE WITH A MEAL OR IMMEDIATELY FOLLOWING A MEAL) 45 tablet 2  . Multiple Vitamins-Minerals (OCUVITE EYE HEATLH GUMMIES PO) Take 2 tablets by mouth daily.    [name] Glycol-Propyl Glycol (SYSTANE OP) Place 1 drop into both eyes at bedtime.    . potassium chloride SA (KLOR-CON M20) 20 MEQ tablet Take 2 tablets (40 mEq total) by mouth 2 (two) times daily. 360 tablet 3   No current facility-administered medications for this visit.    PHYSICAL EXAMINATION: ECOG PERFORMANCE STATUS: 1 - Symptomatic but completely ambulatory  Vitals:   09/29/20 1205  BP: 138/78  Pulse: 72  Resp: 19  Temp: 97.6  F (36.4 C)  SpO2: 98%   Filed Weights   09/29/20 1205  Weight: 170 lb 12.8 oz (77.5 kg)    BREAST: No palpable masses or nodules in either right or left breasts. No palpable axillary supraclavicular or infraclavicular adenopathy no breast tenderness or nipple discharge. (exam performed in the presence of a chaperone)  LABORATORY DATA:  I have reviewed the data as listed CMP Latest Ref Rng & Units 03/23/2020 12/31/2019 10/30/2014  Glucose 70 - 99 mg/dL  121(H) 113(H) 71  BUN 8 - 23 mg/dL 13 19 29(H)  Creatinine 0.44 - 1.00 mg/dL 0.88 0.94 1.18(H)  Sodium 135 - 145 mmol/L 139 142 141  Potassium 3.5 - 5.1 mmol/L 4.1 4.0 4.7  Chloride 98 - 111 mmol/L 103 104 99  CO2 22 - 32 mmol/L '27 30 28  ' Calcium 8.9 - 10.3 mg/dL 9.2 10.1 9.0  Total Protein 6.5 - 8.1 g/dL - 7.2 -  Total Bilirubin 0.3 - 1.2 mg/dL - 1.4(H) -  Alkaline Phos 38 - 126 U/L - 67 -  AST 15 - 41 U/L - 14(L) -  ALT 0 - 44 U/L - 7 -    Lab Results  Component Value Date   WBC 6.7 12/31/2019   HGB 13.0 12/31/2019   HCT 38.9 12/31/2019   MCV 93.1 12/31/2019   PLT 174 12/31/2019   NEUTROABS 4.3 12/31/2019    ASSESSMENT & PLAN:  Malignant neoplasm of upper-outer quadrant of left breast in female, estrogen receptor positive ([place]) 03/23/20: Left mastectomy [name]): IDC, grade 3, 2.5cm, clear margins, ER 95%, PR 90%, Ki-67 15%, HER-2 negative   Treatment Plan: continue Anastrozole  Anastrozole toxicities: Denies any hot flashes or arthralgias or myalgias. Breast cancer surveillance: 1.  Breast exam 09/29/2020: Benign right mastectomy scar is intact 2. left breast mammogram annually 03/03/2020: Benign breast density category C  Return to clinic in 1 year for follow-up    No orders of the defined types were placed in this encounter.  The patient has a good understanding of the overall plan. she agrees with it. she will call with any problems that may develop before the next visit here.  Total time spent: 20 mins including face to face time and time spent for planning, charting and coordination of care  Rulon Eisenmenger, MD, MPH 09/29/2020  I, Cloyde Reams Dorshimer, am acting as scribe for Dr. Nicholas Lose.  I have reviewed the above documentation for accuracy and completeness, and I agree with the above.

## 2020-09-28 NOTE — Assessment & Plan Note (Signed)
03/23/20: Left mastectomy [name]): IDC, grade 3, 2.5cm, clear margins, ER 95%, PR 90%, Ki-67 15%, HER-2 negative   Treatment Plan: continue Anastrozole  Anastrozole toxicities:  Return to clinic in 1 year for follow-up

## 2020-09-29 ENCOUNTER — Inpatient Hospital Stay: Payer: Medicare PPO | Attending: Hematology and Oncology | Admitting: Hematology and Oncology

## 2020-09-29 ENCOUNTER — Other Ambulatory Visit: Payer: Self-pay

## 2020-09-29 DIAGNOSIS — C50412 Malignant neoplasm of upper-outer quadrant of left female breast: Secondary | ICD-10-CM | POA: Diagnosis not present

## 2020-09-29 DIAGNOSIS — Z170 Estrogen receptor positive status [ER+]: Secondary | ICD-10-CM

## 2020-09-29 DIAGNOSIS — Z79811 Long term (current) use of aromatase inhibitors: Secondary | ICD-10-CM | POA: Insufficient documentation

## 2020-09-29 DIAGNOSIS — Z9012 Acquired absence of left breast and nipple: Secondary | ICD-10-CM | POA: Insufficient documentation

## 2020-09-29 MED ORDER — CALCIUM CARBONATE 1500 (600 CA) MG PO TABS: 600.0000 mg | ORAL_TABLET | Freq: Every day | ORAL | 0 refills | Status: DC

## 2020-11-04 DIAGNOSIS — E559 Vitamin D deficiency, unspecified: Secondary | ICD-10-CM | POA: Diagnosis not present

## 2020-11-04 DIAGNOSIS — E039 Hypothyroidism, unspecified: Secondary | ICD-10-CM | POA: Diagnosis not present

## 2020-11-04 DIAGNOSIS — N1831 Chronic kidney disease, stage 3a: Secondary | ICD-10-CM | POA: Diagnosis not present

## 2020-11-04 DIAGNOSIS — R7302 Impaired glucose tolerance (oral): Secondary | ICD-10-CM | POA: Diagnosis not present

## 2020-11-04 DIAGNOSIS — I130 Hypertensive heart and chronic kidney disease with heart failure and stage 1 through stage 4 chronic kidney disease, or unspecified chronic kidney disease: Secondary | ICD-10-CM | POA: Diagnosis not present

## 2020-11-11 DIAGNOSIS — I509 Heart failure, unspecified: Secondary | ICD-10-CM | POA: Diagnosis not present

## 2020-11-11 DIAGNOSIS — I130 Hypertensive heart and chronic kidney disease with heart failure and stage 1 through stage 4 chronic kidney disease, or unspecified chronic kidney disease: Secondary | ICD-10-CM | POA: Diagnosis not present

## 2020-11-11 DIAGNOSIS — I890 Lymphedema, not elsewhere classified: Secondary | ICD-10-CM | POA: Diagnosis not present

## 2020-11-11 DIAGNOSIS — Z0000 Encounter for general adult medical examination without abnormal findings: Secondary | ICD-10-CM | POA: Diagnosis not present

## 2020-11-11 DIAGNOSIS — N1831 Chronic kidney disease, stage 3a: Secondary | ICD-10-CM | POA: Diagnosis not present

## 2020-11-11 DIAGNOSIS — C50912 Malignant neoplasm of unspecified site of left female breast: Secondary | ICD-10-CM | POA: Diagnosis not present

## 2020-11-11 DIAGNOSIS — Z170 Estrogen receptor positive status [ER+]: Secondary | ICD-10-CM | POA: Diagnosis not present

## 2020-11-11 DIAGNOSIS — R7302 Impaired glucose tolerance (oral): Secondary | ICD-10-CM | POA: Diagnosis not present

## 2020-11-11 DIAGNOSIS — E039 Hypothyroidism, unspecified: Secondary | ICD-10-CM | POA: Diagnosis not present

## 2020-11-11 DIAGNOSIS — R82998 Other abnormal findings in urine: Secondary | ICD-10-CM | POA: Diagnosis not present

## 2020-12-18 ENCOUNTER — Other Ambulatory Visit: Payer: Self-pay | Admitting: Hematology and Oncology

## 2020-12-29 ENCOUNTER — Telehealth: Payer: Self-pay | Admitting: *Deleted

## 2020-12-29 NOTE — Telephone Encounter (Signed)
Daughter states [name] received a message from [facility] to schedule her annual mammogram. They are asking if she needs Diagnostic? And is it for right breast only?  Had left mastectomy 11/21.

## 2020-12-30 ENCOUNTER — Other Ambulatory Visit: Payer: Self-pay | Admitting: *Deleted

## 2020-12-30 DIAGNOSIS — C50412 Malignant neoplasm of upper-outer quadrant of left female breast: Secondary | ICD-10-CM

## 2020-12-30 DIAGNOSIS — Z170 Estrogen receptor positive status [ER+]: Secondary | ICD-10-CM

## 2020-12-30 NOTE — Progress Notes (Signed)
Per MD pt needing to undergo yearly right breast screening mammogram.  Orders successfully faxed to [facility] and pt daughter Pamala Hurry notified and verbalized understanding.

## 2021-01-25 DIAGNOSIS — R269 Unspecified abnormalities of gait and mobility: Secondary | ICD-10-CM | POA: Diagnosis not present

## 2021-01-25 DIAGNOSIS — D6869 Other thrombophilia: Secondary | ICD-10-CM | POA: Diagnosis not present

## 2021-01-25 DIAGNOSIS — E039 Hypothyroidism, unspecified: Secondary | ICD-10-CM | POA: Diagnosis not present

## 2021-01-25 DIAGNOSIS — F325 Major depressive disorder, single episode, in full remission: Secondary | ICD-10-CM | POA: Diagnosis not present

## 2021-01-25 DIAGNOSIS — R32 Unspecified urinary incontinence: Secondary | ICD-10-CM | POA: Diagnosis not present

## 2021-01-25 DIAGNOSIS — I4891 Unspecified atrial fibrillation: Secondary | ICD-10-CM | POA: Diagnosis not present

## 2021-01-25 DIAGNOSIS — I10 Essential (primary) hypertension: Secondary | ICD-10-CM | POA: Diagnosis not present

## 2021-01-25 DIAGNOSIS — M1990 Unspecified osteoarthritis, unspecified site: Secondary | ICD-10-CM | POA: Diagnosis not present

## 2021-01-25 DIAGNOSIS — H3530 Unspecified macular degeneration: Secondary | ICD-10-CM | POA: Diagnosis not present

## 2021-01-31 DIAGNOSIS — L603 Nail dystrophy: Secondary | ICD-10-CM | POA: Diagnosis not present

## 2021-01-31 DIAGNOSIS — L84 Corns and callosities: Secondary | ICD-10-CM | POA: Diagnosis not present

## 2021-01-31 DIAGNOSIS — M79671 Pain in right foot: Secondary | ICD-10-CM | POA: Diagnosis not present

## 2021-01-31 DIAGNOSIS — I739 Peripheral vascular disease, unspecified: Secondary | ICD-10-CM | POA: Diagnosis not present

## 2021-01-31 DIAGNOSIS — M79672 Pain in left foot: Secondary | ICD-10-CM | POA: Diagnosis not present

## 2021-03-07 DIAGNOSIS — R921 Mammographic calcification found on diagnostic imaging of breast: Secondary | ICD-10-CM | POA: Diagnosis not present

## 2021-03-07 DIAGNOSIS — R922 Inconclusive mammogram: Secondary | ICD-10-CM | POA: Diagnosis not present

## 2021-03-07 DIAGNOSIS — Z853 Personal history of malignant neoplasm of breast: Secondary | ICD-10-CM | POA: Diagnosis not present

## 2021-03-17 ENCOUNTER — Other Ambulatory Visit: Payer: Self-pay | Admitting: Hematology and Oncology

## 2021-04-01 DIAGNOSIS — Z85038 Personal history of other malignant neoplasm of large intestine: Secondary | ICD-10-CM | POA: Diagnosis not present

## 2021-04-01 DIAGNOSIS — C50912 Malignant neoplasm of unspecified site of left female breast: Secondary | ICD-10-CM | POA: Diagnosis not present

## 2021-04-08 ENCOUNTER — Telehealth: Payer: Self-pay | Admitting: Genetic Counselor

## 2021-04-08 NOTE — Telephone Encounter (Signed)
Scheduled appt per 12/9 referral. Pt is aware of appt date and time.

## 2021-05-10 ENCOUNTER — Inpatient Hospital Stay: Payer: Medicare PPO

## 2021-05-10 ENCOUNTER — Inpatient Hospital Stay: Payer: Medicare PPO | Admitting: Genetic Counselor

## 2021-05-16 DIAGNOSIS — F325 Major depressive disorder, single episode, in full remission: Secondary | ICD-10-CM | POA: Diagnosis not present

## 2021-05-16 DIAGNOSIS — R7302 Impaired glucose tolerance (oral): Secondary | ICD-10-CM | POA: Diagnosis not present

## 2021-05-16 DIAGNOSIS — Z170 Estrogen receptor positive status [ER+]: Secondary | ICD-10-CM | POA: Diagnosis not present

## 2021-05-16 DIAGNOSIS — I509 Heart failure, unspecified: Secondary | ICD-10-CM | POA: Diagnosis not present

## 2021-05-16 DIAGNOSIS — C50912 Malignant neoplasm of unspecified site of left female breast: Secondary | ICD-10-CM | POA: Diagnosis not present

## 2021-05-16 DIAGNOSIS — E039 Hypothyroidism, unspecified: Secondary | ICD-10-CM | POA: Diagnosis not present

## 2021-05-16 DIAGNOSIS — N1831 Chronic kidney disease, stage 3a: Secondary | ICD-10-CM | POA: Diagnosis not present

## 2021-05-16 DIAGNOSIS — I890 Lymphedema, not elsewhere classified: Secondary | ICD-10-CM | POA: Diagnosis not present

## 2021-05-16 DIAGNOSIS — I130 Hypertensive heart and chronic kidney disease with heart failure and stage 1 through stage 4 chronic kidney disease, or unspecified chronic kidney disease: Secondary | ICD-10-CM | POA: Diagnosis not present

## 2021-06-01 DIAGNOSIS — I739 Peripheral vascular disease, unspecified: Secondary | ICD-10-CM | POA: Diagnosis not present

## 2021-06-01 DIAGNOSIS — L84 Corns and callosities: Secondary | ICD-10-CM | POA: Diagnosis not present

## 2021-06-01 DIAGNOSIS — L603 Nail dystrophy: Secondary | ICD-10-CM | POA: Diagnosis not present

## 2021-08-31 DIAGNOSIS — I739 Peripheral vascular disease, unspecified: Secondary | ICD-10-CM | POA: Diagnosis not present

## 2021-08-31 DIAGNOSIS — L84 Corns and callosities: Secondary | ICD-10-CM | POA: Diagnosis not present

## 2021-08-31 DIAGNOSIS — L603 Nail dystrophy: Secondary | ICD-10-CM | POA: Diagnosis not present

## 2021-09-06 ENCOUNTER — Other Ambulatory Visit: Payer: Self-pay | Admitting: Hematology and Oncology

## 2021-09-14 DIAGNOSIS — I872 Venous insufficiency (chronic) (peripheral): Secondary | ICD-10-CM | POA: Diagnosis not present

## 2021-09-14 DIAGNOSIS — L821 Other seborrheic keratosis: Secondary | ICD-10-CM | POA: Diagnosis not present

## 2021-09-14 DIAGNOSIS — D225 Melanocytic nevi of trunk: Secondary | ICD-10-CM | POA: Diagnosis not present

## 2021-09-14 DIAGNOSIS — C44629 Squamous cell carcinoma of skin of left upper limb, including shoulder: Secondary | ICD-10-CM | POA: Diagnosis not present

## 2021-09-14 DIAGNOSIS — I8312 Varicose veins of left lower extremity with inflammation: Secondary | ICD-10-CM | POA: Diagnosis not present

## 2021-09-14 DIAGNOSIS — I8311 Varicose veins of right lower extremity with inflammation: Secondary | ICD-10-CM | POA: Diagnosis not present

## 2021-09-14 DIAGNOSIS — D485 Neoplasm of uncertain behavior of skin: Secondary | ICD-10-CM | POA: Diagnosis not present

## 2021-09-14 DIAGNOSIS — L570 Actinic keratosis: Secondary | ICD-10-CM | POA: Diagnosis not present

## 2021-09-14 DIAGNOSIS — Z85828 Personal history of other malignant neoplasm of skin: Secondary | ICD-10-CM | POA: Diagnosis not present

## 2021-09-14 DIAGNOSIS — L905 Scar conditions and fibrosis of skin: Secondary | ICD-10-CM | POA: Diagnosis not present

## 2021-09-29 ENCOUNTER — Inpatient Hospital Stay: Payer: Medicare PPO | Attending: Hematology and Oncology | Admitting: Hematology and Oncology

## 2021-09-29 ENCOUNTER — Other Ambulatory Visit: Payer: Self-pay

## 2021-09-29 DIAGNOSIS — Z170 Estrogen receptor positive status [ER+]: Secondary | ICD-10-CM | POA: Diagnosis not present

## 2021-09-29 DIAGNOSIS — C50412 Malignant neoplasm of upper-outer quadrant of left female breast: Secondary | ICD-10-CM

## 2021-09-29 DIAGNOSIS — H9190 Unspecified hearing loss, unspecified ear: Secondary | ICD-10-CM | POA: Diagnosis not present

## 2021-09-29 DIAGNOSIS — Z79811 Long term (current) use of aromatase inhibitors: Secondary | ICD-10-CM | POA: Insufficient documentation

## 2021-09-29 MED ORDER — ANASTROZOLE 1 MG PO TABS: 1.0000 mg | ORAL_TABLET | Freq: Every day | ORAL | 4 refills | Status: DC

## 2021-09-29 NOTE — Progress Notes (Signed)
She is 88  Patient Care Team: Tisovec, Fransico Him, MD as PCP - General (Internal Medicine) Rockwell Germany, RN as Oncology Nurse Navigator Mauro Kaufmann, RN as Oncology Nurse Navigator Alphonsa Overall, MD as Consulting Physician (General Surgery) Nicholas Lose, MD as Consulting Physician (Hematology and Oncology) Kyung Rudd, MD as Consulting Physician (Radiation Oncology)  DIAGNOSIS:  Encounter Diagnosis  Name Primary?   Malignant neoplasm of upper-outer quadrant of left breast in female, estrogen receptor positive ([place])     SUMMARY OF ONCOLOGIC HISTORY: Oncology History  Malignant neoplasm of upper-outer quadrant of left breast in female, estrogen receptor positive ([place])  1998 Cancer Diagnosis   History of left breast cancer in 1998 for which she underwent a lumpectomy and radiation   12/29/2019 Initial Diagnosis   Patient noted enlargement of the left breast. Mammogram showed a 3.3cm left breast mass at the 12 o'clock position.  Biopsy revealed grade 2 IDC ER 95%, PR 90%, Ki-67 15%, HER-2 negative   12/31/2019 Cancer Staging   Staging form: Breast, AJCC 8th Edition - Clinical stage from 12/31/2019: Stage IB (cT2, cN0, cM0, G2, ER+, PR+, HER2-) - Signed by Nicholas Lose, MD on 12/31/2019    12/31/2019 -  Anti-estrogen oral therapy   Anastrozole    03/23/2020 Surgery   Left mastectomy [name]): IDC, grade 3, 2.5cm, clear margins     CHIEF COMPLIANT: Follow-up of left breast cancer on anastrozole   INTERVAL HISTORY: [name] is a 88 y.o. with above-mentioned history of recurrent left breast cancer who underwent a left mastectomy and is currently on antiestrogen therapy with anastrozole. She presents to the clinic today for follow-up. States that she is tolerating    ALLERGIES:  is allergic to codeine and sulfa antibiotics.  MEDICATIONS:  Current Outpatient Medications  Medication Sig Dispense Refill   acetaminophen (TYLENOL) 500 MG tablet Take 1,000 mg by mouth 2 (two)  times daily. Mainly just two at night.     anastrozole (ARIMIDEX) 1 MG tablet Take 1 tablet (1 mg total) by mouth daily. 90 tablet 4   calcium carbonate (CALTRATE 600) 1500 (600 Ca) MG TABS tablet Take 1 tablet (1,500 mg total) by mouth daily with breakfast.  0   diphenhydrAMINE (BENYLIN) 12.5 MG/5ML syrup Take 10 mLs (25 mg total) by mouth every 4 (four) hours as needed for itching or allergies. 120 mL 0   furosemide (LASIX) 40 MG tablet TAKE 1 TABLET (40 MG TOTAL) BY MOUTH 2 (TWO) TIMES DAILY. 30 tablet 0   levothyroxine (SYNTHROID, LEVOTHROID) 50 MCG tablet Take 50 mcg by mouth daily.  5   metoprolol succinate (TOPROL-XL) 25 MG 24 hr tablet TAKE 1/2 TABLET BY MOUTH EVERY DAY (TAKE WITH A MEAL OR IMMEDIATELY FOLLOWING A MEAL) 45 tablet 2   Multiple Vitamins-Minerals (OCUVITE EYE HEATLH GUMMIES PO) Take 2 tablets by mouth daily.     Polyethyl Glycol-Propyl Glycol (SYSTANE OP) Place 1 drop into both eyes at bedtime.     potassium chloride SA (KLOR-CON M20) 20 MEQ tablet Take 2 tablets (40 mEq total) by mouth 2 (two) times daily. 360 tablet 3   No current facility-administered medications for this visit.    PHYSICAL EXAMINATION: ECOG PERFORMANCE STATUS: 1 - Symptomatic but completely ambulatory  Vitals:   09/29/21 1147  BP: (!) 155/75  Pulse: 67  Resp: 16  Temp: (!) 97.4 F (36.3 C)  SpO2: 95%   Filed Weights   09/29/21 1147  Weight: 167 lb 12.8 oz (76.1 kg)  BREAST: People who lab No palpable ma and enjoy life level along nes or nodules in either right or left breasts. No palpable axillary supraclavicular or infraclavicular adenopathy no breast tenderness or nipple discharge. (exam performed in the presence of a chaperone)  LABORATORY DATA:  I have reviewed the data as listed    Latest Ref Rng & Units 03/23/2020   10:41 AM 12/31/2019    8:21 AM 10/30/2014    4:48 PM  CMP  Glucose 70 - 99 mg/dL 121   113   71    BUN 8 - 23 mg/dL _0 Creatinine 0.44 - 1.00 mg/dL  0.88   0.94   1.18    Sodium 135 - 145 mmol/L 139   142   141    Potassium 3.5 - 5.1 mmol/L 4.1   4.0   4.7    Chloride 98 - 111 mmol/L 103   104   99    CO2 22 - 32 mmol/L _1 Calcium 8.9 - 10.3 mg/dL 9.2   10.1   9.0    Total Protein 6.5 - 8.1 g/dL  7.2     Total Bilirubin 0.3 - 1.2 mg/dL  1.4     Alkaline Phos 38 - 126 U/L  67     AST 15 - 41 U/L  14     ALT 0 - 44 U/L  7       Lab Results  Component Value Date   WBC 6.7 12/31/2019   HGB 13.0 12/31/2019   HCT 38.9 12/31/2019   MCV 93.1 12/31/2019   PLT 174 12/31/2019   NEUTROABS 4.3 12/31/2019    ASSESSMENT & PLAN:  Malignant neoplasm of upper-outer quadrant of left breast in female, estrogen receptor positive ([place]) 03/23/20: Left mastectomy [name]): IDC, grade 3, 2.5cm, clear margins, ER 95%, PR 90%, Ki-67 15%, HER-2 negative Treatment Plan: Anastrozole started 03/23/2020   Anastrozole toxicities: Denies any hot flashes or arthralgias or myalgias. Breast cancer surveillance: 1.  Breast exam 09/29/2020: Benign right mastectomy scar is intact 2. left breast mammogram annually 03/07/2021: Benign breast density category C   She is extremely hard of hearing but is in excellent spirits today. Return to clinic in 1 year for follow-up   No orders of the defined types were placed in this encounter.  The patient has a good understanding of the overall plan. she agrees with it. she will call with any problems that may develop before the next visit here. Total time spent: 30 mins including face to face time and time spent for planning, charting and co-ordination of care   Harriette Ohara, MD 09/29/21    I Gardiner Coins am scribing for Dr. Lindi Adie  I have reviewed the above documentation for accuracy and completeness, and I agree with the above.

## 2021-09-29 NOTE — Assessment & Plan Note (Addendum)
03/23/20:Left mastectomy [name]): IDC, grade 3, 2.5cm, clear margins,ER 95%, PR 90%, Ki-67 15%, HER-2 negative Treatment Plan: Anastrozole started 03/23/2020  Anastrozole toxicities: Denies any hot flashes or arthralgias or myalgias. Breast cancer surveillance: 1.  Breast exam 09/29/2020: Benign right mastectomy scar is intact 2. left breast mammogram annually 03/07/2021: Benign breast density category C  She is extremely hard of hearing but is in excellent spirits today. Return to clinic in 1 year for follow-up

## 2021-10-11 DIAGNOSIS — H900 Conductive hearing loss, bilateral: Secondary | ICD-10-CM | POA: Diagnosis not present

## 2021-10-11 DIAGNOSIS — F325 Major depressive disorder, single episode, in full remission: Secondary | ICD-10-CM | POA: Diagnosis not present

## 2021-10-11 DIAGNOSIS — Z113 Encounter for screening for infections with a predominantly sexual mode of transmission: Secondary | ICD-10-CM | POA: Diagnosis not present

## 2021-10-11 DIAGNOSIS — Z170 Estrogen receptor positive status [ER+]: Secondary | ICD-10-CM | POA: Diagnosis not present

## 2021-10-11 DIAGNOSIS — E039 Hypothyroidism, unspecified: Secondary | ICD-10-CM | POA: Diagnosis not present

## 2021-10-11 DIAGNOSIS — R413 Other amnesia: Secondary | ICD-10-CM | POA: Diagnosis not present

## 2021-11-08 ENCOUNTER — Encounter: Payer: Self-pay | Admitting: Neurology

## 2021-11-08 ENCOUNTER — Ambulatory Visit: Payer: Medicare PPO | Admitting: Neurology

## 2021-11-08 VITALS — BP 119/70 | HR 71 | Ht 65.0 in | Wt 165.0 lb

## 2021-11-08 DIAGNOSIS — F02A0 Dementia in other diseases classified elsewhere, mild, without behavioral disturbance, psychotic disturbance, mood disturbance, and anxiety: Secondary | ICD-10-CM

## 2021-11-08 DIAGNOSIS — G301 Alzheimer's disease with late onset: Secondary | ICD-10-CM | POA: Diagnosis not present

## 2021-11-08 MED ORDER — SERTRALINE HCL 25 MG PO TABS: 25.0000 mg | ORAL_TABLET | Freq: Every day | ORAL | 6 refills | Status: DC

## 2021-11-08 MED ORDER — DONEPEZIL HCL 10 MG PO TABS: 10.0000 mg | ORAL_TABLET | Freq: Every day | ORAL | 11 refills | Status: DC

## 2021-11-08 NOTE — Patient Instructions (Addendum)
Start with Aricept 10 mg 1/2 tablet nightly for 2 weeks then increase to full tablet  Discontinue Exelon  Start with Zoloft 25 mg daily for symptoms for depression  CT head without contrast  Continue with your other medications  Return in 6 months

## 2021-11-08 NOTE — Progress Notes (Signed)
[facility]  PATIENT: [name] DOB: 1927/08/13  REQUESTING CLINICIAN: Tisovec, Fransico Him, MD HISTORY FROM: Patient and daughter [name]  REASON FOR VISIT: Worsening memory    HISTORICAL  CHIEF COMPLAINT:  Chief Complaint  Patient presents with   New Patient (Initial Visit)    Rm 12. Accompanied by daughter, [name]. NX Love/Paper proficient/Richard Tisovec MD [facility]/Memory loss.    HISTORY OF PRESENT ILLNESS:  This is a 88 year old woman past medical history of hearing loss, history of breast and colon cancer, hypertension, hypothyroidism and memory problem who is presenting for evaluation of worsening memory problem.  Patient reports that sometimes she is forgetful, but her main issue is her severe bilateral hearing loss.  She did have hearing aid in the past but lost 3 pairs because she misplaced them and could not find them.  She thinks her main issue is the hearing loss, she cannot hear people therefore she cannot remember. She also reports due to the hearing loss, she avoids gathering and large crowd because the noise is just too much for her.  Per daughter, she reports patient lives alone, but daughter visit her at least 3 times per day.  There is also someone coming help her during the day.  Currently she is not cooking or cleaning, and she is not driving.  She is unable to go shopping by herself.  She reports that patient is forgetful, even though she does have hearing loss she tends to repeat herself and also forget about recent conversation.  There is also hallucinations visual and auditory hallucinations.  Sometimes she will hear singing or music playing but nothing is playing.  Sometimes, she will report hearing a conversation from the neighbor but due to the patient hearing loss she cannot hear her neighbor speaking in their own house.  There is also report of confusion, sometimes daughter will find a bowl of spaghetti in the drawer  instead of the sink.  Sometimes she will let water run out the faucet.  Denies any recent falls.  She also noted that patient lately is very emotional, she cries a lot, she reminisces about the past and every little memory of the past, or her friends and/or her previous life will make a very upset and she will cry.   TBI:   No past history of TBI Stroke:   no past history of stroke Seizures:   no past history of seizures Sleep:   no history of sleep apnea.   Mood:   patient denies anxiety and depression   Functional status: Dependent in some ADLs Patient lives with alone but has help coming weekly  Cooking: no Cleaning: no Shopping: no Bathing: Herself  Toileting: Herself  Driving: No Bills: Daughter   Ever left the stove on by accident?: No, does not cook  Forget how to use items around the house?: Yes  Getting lost going to familiar places?: Yes  Forgetting loved ones names?: No  Word finding difficulty? Yes  Sleep: Sleep is good    OTHER MEDICAL CONDITIONS: Severe hearing loss, history of breast and colon cancer, hypertension, hypothyroidism,     REVIEW OF SYSTEMS: Full 14 system review of systems performed and negative with exception of: as noted in the HPI  ALLERGIES: Allergies  Allergen Reactions   Codeine Nausea And Vomiting   Sulfa Antibiotics Nausea Only    HOME MEDICATIONS: Outpatient Medications Prior to Visit  Medication Sig Dispense Refill   acetaminophen (TYLENOL) 500 MG tablet  Take 1,000 mg by mouth 2 (two) times daily as needed. Mainly just two at night.     anastrozole (ARIMIDEX) 1 MG tablet Take 1 tablet (1 mg total) by mouth daily. 90 tablet 4   calcium carbonate (CALTRATE 600) 1500 (600 Ca) MG TABS tablet Take 1 tablet (1,500 mg total) by mouth daily with breakfast.  0   diphenhydrAMINE (BENYLIN) 12.5 MG/5ML syrup Take 10 mLs (25 mg total) by mouth every 4 (four) hours as needed for itching or allergies. 120 mL 0   furosemide (LASIX) 40 MG tablet TAKE  1 TABLET (40 MG TOTAL) BY MOUTH 2 (TWO) TIMES DAILY. (Patient taking differently: daily. TAKE 1 TABLET (40 MG TOTAL) BY MOUTH 2 (TWO) TIMES DAILY.) 30 tablet 0   levothyroxine (SYNTHROID, LEVOTHROID) 50 MCG tablet Take 50 mcg by mouth daily.  5   metoprolol succinate (TOPROL-XL) 25 MG 24 hr tablet TAKE 1/2 TABLET BY MOUTH EVERY DAY (TAKE WITH A MEAL OR IMMEDIATELY FOLLOWING A MEAL) 45 tablet 2   Multiple Vitamins-Minerals (OCUVITE EYE HEATLH GUMMIES PO) Take 2 tablets by mouth daily.     potassium chloride SA (KLOR-CON M20) 20 MEQ tablet Take 2 tablets (40 mEq total) by mouth 2 (two) times daily. 360 tablet 3   vitamin B-12 (CYANOCOBALAMIN) 1000 MCG tablet Take 1,000 mcg by mouth daily.     rivastigmine (EXELON) 4.6 mg/24hr Place 4.6 mg onto the skin daily.     Polyethyl Glycol-Propyl Glycol (SYSTANE OP) Place 1 drop into both eyes at bedtime.     No facility-administered medications prior to visit.    PAST MEDICAL HISTORY: Past Medical History:  Diagnosis Date   Anemia    Anxiety    Arthritis    "a little bit qwhere; mainly in my neck & shoulders" (07/09/2014)   Bilateral leg edema    Breast cancer ([place])    Breast cancer, left breast (HCC)    S/P chemo & radiation   Chronic bronchitis ([place])    "usually get it q yr"   Colon cancer ([place])    a. s/p resection 2002.   Family history of adverse reaction to anesthesia    "son has PONV"   H/O Bell's palsy    Hypertension    Hypothyroidism    Paroxysmal atrial flutter ([place])    a. Dx 12/2013 - spont conv to NSR, placed on amiodarone. Back in flutter later 12/2013. s/p DCCV 01/2014 but did not hold - amio discontinued at that time.   PONV (postoperative nausea and vomiting)     PAST SURGICAL HISTORY: Past Surgical History:  Procedure Laterality Date   ABDOMINAL HERNIA REPAIR  01/2007   ABDOMINAL HYSTERECTOMY  1975   APPENDECTOMY  1975   ATRIAL FLUTTER ABLATION  07/09/2014   ATRIAL FLUTTER ABLATION N/A 07/09/2014   Procedure: ATRIAL  FLUTTER ABLATION;  Surgeon: Thompson Grayer, MD;  Location: [facility] C[facility] LAB;  Service: Cardiovascular;  Laterality: N/A;   BREAST BIOPSY Left 1998   BREAST LUMPECTOMY Left 1998   CARDIOVERSION N/A 02/27/2014   Procedure: CARDIOVERSION;  Surgeon: Carlena Bjornstad, MD;  Location: [facility];  Service: Cardiovascular;  Laterality: N/A;   CATARACT EXTRACTION     CATARACT EXTRACTION W/ INTRAOCULAR LENS  IMPLANT, BILATERAL Bilateral 2011   COLECTOMY     EYE SURGERY     HERNIA REPAIR     LOW ANTERIOR BOWEL RESECTION  2002   SIMPLE MASTECTOMY WITH AXILLARY SENTINEL NODE BIOPSY Left 03/23/2020   Procedure: LEFT SIMPLE MASTECTOMY;  Surgeon: Alphonsa Overall, MD;  Location: [facility];  Service: General;  Laterality: Left;    FAMILY HISTORY: Family History  Problem Relation Age of Onset   Diabetes Mother    Cancer - Prostate Father     SOCIAL HISTORY: Social History   Socioeconomic History   Marital status: Widowed    Spouse name: Not on file   Number of children: Not on file   Years of education: Not on file   Highest education level: Not on file  Occupational History   Not on file  Tobacco Use   Smoking status: Former    Packs/day: 1.00    Years: 10.00    Total pack years: 10.00    Types: Cigarettes   Smokeless tobacco: Never   Tobacco comments:    "quit smoking in the 1960's"  Vaping Use   Vaping Use: Never used  Substance and Sexual Activity   Alcohol use: Yes    Comment: 07/09/2014 "might have a drink a couple times/yr"   Drug use: No   Sexual activity: Not on file  Other Topics Concern   Not on file  Social History Narrative   Not on file   Social Determinants of Health   Financial Resource Strain: Low Risk  (12/31/2019)   Overall Financial Resource Strain (CARDIA)    Difficulty of Paying Living Expenses: Not very hard  Food Insecurity: No Food Insecurity (12/31/2019)   Hunger Vital Sign    Worried About Running Out of Food in the Last Year: Never true    Ran  Out of Food in the Last Year: Never true  Transportation Needs: No Transportation Needs (12/31/2019)   PRAPARE - Hydrologist (Medical): No    Lack of Transportation (Non-Medical): No  Physical Activity: Not on file  Stress: Not on file  Social Connections: Not on file  Intimate Partner Violence: Not on file    PHYSICAL EXAM  GENERAL EXAM/CONSTITUTIONAL: Vitals:  Vitals:   11/08/21 1313  BP: 119/70  Pulse: 71  Weight: 165 lb (74.8 kg)  Height: '5\' 5"'$  (1.651 m)   Body mass index is 27.46 kg/m. Wt Readings from Last 3 Encounters:  11/08/21 165 lb (74.8 kg)  09/29/21 167 lb 12.8 oz (76.1 kg)  09/29/20 170 lb 12.8 oz (77.5 kg)   Patient is in no distress; well developed, nourished and groomed; neck is supple  EYES: Pupils round and reactive to light, Visual fields full to confrontation, Extraocular movements intacts,   MUSCULOSKELETAL: Gait, strength, tone, movements noted in Neurologic exam below  NEUROLOGIC: MENTAL STATUS:     11/08/2021    1:19 PM  MMSE - Mini Mental State Exam  Not completed: Unable to complete   awake, alert, oriented to person Able to follow simple commands  Unable to perform MoCA due to hearing loss    CRANIAL NERVE:  2nd, 3rd, 4th, 6th - pupils equal and reactive to light, visual fields full to confrontation, extraocular muscles intact, no nystagmus 5th - facial sensation symmetric 7th - facial strength symmetric 8th - hearing intact 9th - palate elevates symmetrically, uvula midline 11th - shoulder shrug symmetric 12th - tongue protrusion midline  MOTOR:  normal bulk and tone, at least antigravity in the BUE, BLE  SENSORY:  normal and symmetric to light touch  COORDINATION:  finger-nose-finger, fine finger movements normal  REFLEXES:  deep tendon reflexes present and symmetric  GAIT/STATION:  Wide based and short strides  DIAGNOSTIC DATA (LABS, IMAGING, TESTING) - I reviewed patient records,  labs, notes, testing and imaging myself where available.  Lab Results  Component Value Date   WBC 6.7 12/31/2019   HGB 13.0 12/31/2019   HCT 38.9 12/31/2019   MCV 93.1 12/31/2019   PLT 174 12/31/2019      Component Value Date/Time   NA 139 03/23/2020 1041   K 4.1 03/23/2020 1041   CL 103 03/23/2020 1041   CO2 27 03/23/2020 1041   GLUCOSE 121 (H) 03/23/2020 1041   BUN 13 03/23/2020 1041   CREATININE 0.88 03/23/2020 1041   CREATININE 0.94 12/31/2019 0821   CREATININE 1.18 (H) 10/30/2014 1648   CALCIUM 9.2 03/23/2020 1041   PROT 7.2 12/31/2019 0821   ALBUMIN 3.9 12/31/2019 0821   AST 14 (L) 12/31/2019 0821   ALT 7 12/31/2019 0821   ALKPHOS 67 12/31/2019 0821   BILITOT 1.4 (H) 12/31/2019 0821   GFRNONAA >60 03/23/2020 1041   GFRNONAA 53 (L) 12/31/2019 0821   GFRAA >60 12/31/2019 0821   No results found for: "CHOL", "HDL", "LDLCALC", "LDLDIRECT", "TRIG", "CHOLHDL" Lab Results  Component Value Date   HGBA1C 6.2 (H) 01/03/2014   No results found for: "VITAMINB12" Lab Results  Component Value Date   TSH 2.17 03/09/2014       ASSESSMENT AND PLAN  88 y.o. year old female with history of severe hearing loss, history of breast and colon cancer, hypertension, hypothyroidism who is presenting with complaint of worsening memory, visual and auditory hallucinations.  Patient does have memory problems with dependence in her activity of daily living. Her memory problem is made worse by her severe bilateral hearing loss.  She likely has Alzheimer type dementia. She was prescribed rivastigmine patch but the patch has been coming off.  At this time, I am going to try her on Aricept 10 mg nightly and if unable to tolerate Aricept then we will consider rivastigmine tablet.  Due to her emotional lability, I will also start her on low-dose Zoloft 25 mg. We will obtain a CT head for further evaluation.  Advised patient to continue on the medication and I will see her for 6 months for follow-up.   Also strongly advised patient and daughter to follow-up with audiologist, ENT to obtain new pair of hearing aid.  They are comfortable with plans.   1. Mild late onset Alzheimer's dementia without behavioral disturbance, psychotic disturbance, mood disturbance, or anxiety ([place])      Patient Instructions  Start with Aricept 10 mg 1/2 tablet nightly for 2 weeks then increase to full tablet  Discontinue Exelon  Start with Zoloft 25 mg daily for symptoms for depression  CT head without contrast  Continue with your other medications  Return in 6 months   Orders Placed This Encounter  Procedures   CT HEAD WO CONTRAST (5MM)    Meds ordered this encounter  Medications   donepezil (ARICEPT) 10 MG tablet    Sig: Take 1 tablet (10 mg total) by mouth at bedtime.    Dispense:  30 tablet    Refill:  11   sertraline (ZOLOFT) 25 MG tablet    Sig: Take 1 tablet (25 mg total) by mouth daily.    Dispense:  30 tablet    Refill:  6    Return in about 6 months (around 05/11/2022).  I have spent a total of 75 minutes dedicated to this patient today, preparing to see patient, performing a medically appropriate examination and evaluation,  ordering tests and/or medications and procedures, and counseling and educating the patient/family/caregiver; independently interpreting result and communicating results to the family/patient/caregiver; and documenting clinical information in the electronic medical record.   Alric Ran, MD 11/08/2021, 5:37 PM  [facility] [address] [place], [place] [address] [phone]

## 2021-11-09 ENCOUNTER — Telehealth: Payer: Self-pay | Admitting: Neurology

## 2021-11-09 NOTE — Telephone Encounter (Signed)
Thank you, I have told them 1/2 for 2 weeks then increase.

## 2021-11-09 NOTE — Telephone Encounter (Signed)
Pt's daughter, Burgess Estelle (on [place]) starting taking sertraline (ZOLOFT) 25 MG tablet and donepezil (ARICEPT) 10 MG tabletyesterday vomiting, diarrhea, bad dream. Pt refuse to take due to the side effects until hear from the physician. Daughter would like a call from the nurse.

## 2021-11-09 NOTE — Telephone Encounter (Signed)
I called the pt's daughter back. She sts yesterday the pt took the sertraline at lunch and the Aricept at bedtime for the first time.   Daughter reports in the middle of the night the pt woke up with nausea, vomiting, diarrhea, and possible nightmare ( pt told daughter she received a call that her neighbor had died, daughter could not confirm this as true).  We discussed the dosage of the medications and daughter reports the pt took the whole tablet of the Aricept instead of the 1/2 tablet.   Pt will try the 1/2 tablet of Aricept tonight and let us know how this goes. She did not take the sertraline today but will restart tomorrow.

## 2021-11-14 ENCOUNTER — Telehealth: Payer: Self-pay | Admitting: Neurology

## 2021-11-14 DIAGNOSIS — I130 Hypertensive heart and chronic kidney disease with heart failure and stage 1 through stage 4 chronic kidney disease, or unspecified chronic kidney disease: Secondary | ICD-10-CM | POA: Diagnosis not present

## 2021-11-14 DIAGNOSIS — L821 Other seborrheic keratosis: Secondary | ICD-10-CM | POA: Diagnosis not present

## 2021-11-14 DIAGNOSIS — I872 Venous insufficiency (chronic) (peripheral): Secondary | ICD-10-CM | POA: Diagnosis not present

## 2021-11-14 DIAGNOSIS — R7302 Impaired glucose tolerance (oral): Secondary | ICD-10-CM | POA: Diagnosis not present

## 2021-11-14 DIAGNOSIS — E538 Deficiency of other specified B group vitamins: Secondary | ICD-10-CM | POA: Diagnosis not present

## 2021-11-14 DIAGNOSIS — E039 Hypothyroidism, unspecified: Secondary | ICD-10-CM | POA: Diagnosis not present

## 2021-11-14 DIAGNOSIS — Z79899 Other long term (current) drug therapy: Secondary | ICD-10-CM | POA: Diagnosis not present

## 2021-11-14 DIAGNOSIS — N1831 Chronic kidney disease, stage 3a: Secondary | ICD-10-CM | POA: Diagnosis not present

## 2021-11-14 DIAGNOSIS — E559 Vitamin D deficiency, unspecified: Secondary | ICD-10-CM | POA: Diagnosis not present

## 2021-11-14 DIAGNOSIS — L905 Scar conditions and fibrosis of skin: Secondary | ICD-10-CM | POA: Diagnosis not present

## 2021-11-14 DIAGNOSIS — F0390 Unspecified dementia without behavioral disturbance: Secondary | ICD-10-CM | POA: Diagnosis not present

## 2021-11-14 DIAGNOSIS — Z85828 Personal history of other malignant neoplasm of skin: Secondary | ICD-10-CM | POA: Diagnosis not present

## 2021-11-14 NOTE — Telephone Encounter (Signed)
[employer] [name]: [phone] exp. 11/14/21-12/14/21 sent to GI

## 2021-11-14 NOTE — Telephone Encounter (Addendum)
Pt's daughter is calling back re: the sertraline (ZOLOFT) 25 MG tablet &  donepezil (ARICEPT) 10 MG tablet.  She states pt is still dealing with with nausea and diarrhea.  Daughter is only giving pt 5 mg of the donepezil which is very hard to cut evenly.  Please call daughter.

## 2021-11-15 NOTE — Telephone Encounter (Signed)
I spoke to the patient's daughter. She has stopped donepezil. She was having a hard time cutting the tablet in half. Diarrhea is better. She is still having a headache and some nausea. They have now stopped the sertraline.   In conversation, it appears she was stopping and starting these medications in a short amount of time. I encouraged her to restart the sertraline '25mg'$ , especially since she is concerned about her mother's mood. She should stick with it for a few weeks, if possible. Her body should get acclimated to the medication and the side effects should subside. Then if she wishes to restart the donepezil later, we could send in a prescription for the '5mg'$  tablets (so she does not have to cut them) until she is ready to move up to '10mg'$ .    She was in agreement with this plan. Instructed her to call us back if the side effects are intolerable.

## 2021-11-15 NOTE — Telephone Encounter (Signed)
When she is ready to restart medication for memory, I would switch her to rivastigmine rather than donepezil. Thanks

## 2021-11-15 NOTE — Telephone Encounter (Signed)
I called her daughter back and provided this update. She will call back when they are ready for start treatment for her memory. She is agreeable to the new medication.

## 2021-11-21 DIAGNOSIS — R82998 Other abnormal findings in urine: Secondary | ICD-10-CM | POA: Diagnosis not present

## 2021-11-21 DIAGNOSIS — R413 Other amnesia: Secondary | ICD-10-CM | POA: Diagnosis not present

## 2021-11-21 DIAGNOSIS — Z1331 Encounter for screening for depression: Secondary | ICD-10-CM | POA: Diagnosis not present

## 2021-11-21 DIAGNOSIS — N1831 Chronic kidney disease, stage 3a: Secondary | ICD-10-CM | POA: Diagnosis not present

## 2021-11-21 DIAGNOSIS — Z0000 Encounter for general adult medical examination without abnormal findings: Secondary | ICD-10-CM | POA: Diagnosis not present

## 2021-11-21 DIAGNOSIS — F325 Major depressive disorder, single episode, in full remission: Secondary | ICD-10-CM | POA: Diagnosis not present

## 2021-11-21 DIAGNOSIS — D692 Other nonthrombocytopenic purpura: Secondary | ICD-10-CM | POA: Diagnosis not present

## 2021-11-21 DIAGNOSIS — E039 Hypothyroidism, unspecified: Secondary | ICD-10-CM | POA: Diagnosis not present

## 2021-11-21 DIAGNOSIS — Z1389 Encounter for screening for other disorder: Secondary | ICD-10-CM | POA: Diagnosis not present

## 2021-11-21 DIAGNOSIS — I509 Heart failure, unspecified: Secondary | ICD-10-CM | POA: Diagnosis not present

## 2021-11-21 DIAGNOSIS — I130 Hypertensive heart and chronic kidney disease with heart failure and stage 1 through stage 4 chronic kidney disease, or unspecified chronic kidney disease: Secondary | ICD-10-CM | POA: Diagnosis not present

## 2021-11-21 DIAGNOSIS — H900 Conductive hearing loss, bilateral: Secondary | ICD-10-CM | POA: Diagnosis not present

## 2021-11-30 ENCOUNTER — Other Ambulatory Visit: Payer: Self-pay | Admitting: Neurology

## 2021-11-30 DIAGNOSIS — L84 Corns and callosities: Secondary | ICD-10-CM | POA: Diagnosis not present

## 2021-11-30 DIAGNOSIS — I739 Peripheral vascular disease, unspecified: Secondary | ICD-10-CM | POA: Diagnosis not present

## 2021-11-30 DIAGNOSIS — L603 Nail dystrophy: Secondary | ICD-10-CM | POA: Diagnosis not present

## 2021-12-06 DIAGNOSIS — H903 Sensorineural hearing loss, bilateral: Secondary | ICD-10-CM | POA: Diagnosis not present

## 2021-12-12 ENCOUNTER — Ambulatory Visit
Admission: RE | Admit: 2021-12-12 | Discharge: 2021-12-12 | Disposition: A | Discharge status: Home / Self Care | Payer: Medicare PPO | Source: Ambulatory Visit | Attending: Neurology | Admitting: Neurology

## 2021-12-12 ENCOUNTER — Telehealth: Payer: Self-pay | Admitting: *Deleted

## 2021-12-12 DIAGNOSIS — F02A0 Dementia in other diseases classified elsewhere, mild, without behavioral disturbance, psychotic disturbance, mood disturbance, and anxiety: Secondary | ICD-10-CM

## 2021-12-12 DIAGNOSIS — R443 Hallucinations, unspecified: Secondary | ICD-10-CM | POA: Diagnosis not present

## 2021-12-12 DIAGNOSIS — R413 Other amnesia: Secondary | ICD-10-CM | POA: Diagnosis not present

## 2021-12-12 NOTE — Telephone Encounter (Signed)
-----   Message from Alric Ran, MD sent at 12/12/2021  4:42 PM EDT ----- Please call and advise the patient that the recent scans CT Brain did not showed any acute findings, such as a stroke, or mass or blood products. There is atrophy of the brain which is consistent with her diagnosis of dementia. No further action is required on this test at this time. Please remind patient to keep any upcoming appointments or tests and to call us with any interim questions, concerns, problems or updates. Thanks,   Alric Ran, MD

## 2021-12-12 NOTE — Progress Notes (Signed)
Please call and advise the patient that the recent scans CT Brain did not showed any acute findings, such as a stroke, or mass or blood products. There is atrophy of the brain which is consistent with her diagnosis of dementia. No further action is required on this test at this time. Please remind patient to keep any upcoming appointments or tests and to call us with any interim questions, concerns, problems or updates. Thanks,   Alric Ran, MD

## 2021-12-13 NOTE — Telephone Encounter (Signed)
I spoke with the patient's daughter, Pamala Hurry (as per [facility]). She verbalized understanding of the findings and expressed appreciation for the call. All questions answered.

## 2022-01-04 ENCOUNTER — Telehealth: Payer: Self-pay | Admitting: Neurology

## 2022-01-04 ENCOUNTER — Other Ambulatory Visit: Payer: Self-pay | Admitting: Neurology

## 2022-01-04 MED ORDER — RIVASTIGMINE TARTRATE 1.5 MG PO CAPS: 1.5000 mg | ORAL_CAPSULE | Freq: Two times a day (BID) | ORAL | 0 refills | Status: DC

## 2022-01-04 NOTE — Telephone Encounter (Signed)
I called the pts daughter back and relayed message. She sts she will consider starting the pt on this new med but at this time she would like to hold off and get the pt to feeling back to normal.   Rx at [facility] requested to be put on hold.

## 2022-01-04 NOTE — Telephone Encounter (Signed)
Pt daughter Pamala Hurry) is calling and requesting a nurse give her call. Pamala Hurry want to discuss Pt medication and changes in the Pt.

## 2022-01-04 NOTE — Telephone Encounter (Signed)
Please call and advise patient to discontinue both medications (Aricept and Zoloft). Please advised her that I am going to prescribed her low dose Exelon 1.5 mg twice daily for her dementia. Please continue to update [place].  Dr. April Manson

## 2022-01-04 NOTE — Telephone Encounter (Signed)
I called the pt's daughter back. She reports the pt continues to have trouble with nausea, headaches, and sleepiness. She reports she has tried lowering the dosage of Sertraline and Donepezil. At this time she would like to d/c meds due to on going side effects and the pt not feeling and acting like her self.   I advised I would fwd message to Dr. April Manson to confirm if ok to d/c both meds.

## 2022-02-28 DIAGNOSIS — I739 Peripheral vascular disease, unspecified: Secondary | ICD-10-CM | POA: Diagnosis not present

## 2022-02-28 DIAGNOSIS — L603 Nail dystrophy: Secondary | ICD-10-CM | POA: Diagnosis not present

## 2022-02-28 DIAGNOSIS — L84 Corns and callosities: Secondary | ICD-10-CM | POA: Diagnosis not present

## 2022-03-08 DIAGNOSIS — Z1231 Encounter for screening mammogram for malignant neoplasm of breast: Secondary | ICD-10-CM | POA: Diagnosis not present

## 2022-03-10 ENCOUNTER — Encounter: Payer: Self-pay | Admitting: Hematology and Oncology

## 2022-03-16 DIAGNOSIS — H903 Sensorineural hearing loss, bilateral: Secondary | ICD-10-CM | POA: Diagnosis not present

## 2022-05-15 ENCOUNTER — Telehealth: Payer: Self-pay | Admitting: Neurology

## 2022-05-15 ENCOUNTER — Encounter: Payer: Self-pay | Admitting: Neurology

## 2022-05-15 ENCOUNTER — Ambulatory Visit: Payer: Medicare PPO | Admitting: Neurology

## 2022-05-15 ENCOUNTER — Other Ambulatory Visit (HOSPITAL_COMMUNITY): Payer: Self-pay

## 2022-05-15 VITALS — BP 125/71 | HR 85 | Wt 168.0 lb

## 2022-05-15 DIAGNOSIS — G301 Alzheimer's disease with late onset: Secondary | ICD-10-CM

## 2022-05-15 DIAGNOSIS — F02A18 Dementia in other diseases classified elsewhere, mild, with other behavioral disturbance: Secondary | ICD-10-CM

## 2022-05-15 DIAGNOSIS — F02A11 Dementia in other diseases classified elsewhere, mild, with agitation: Secondary | ICD-10-CM

## 2022-05-15 MED ORDER — QUETIAPINE FUMARATE 25 MG PO TABS: 12.5000 mg | ORAL_TABLET | Freq: Every day | ORAL | 0 refills | Status: DC
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
## 2022-05-15 NOTE — Patient Instructions (Signed)
Continue other medications.   Start Seroquel half tablet nightly, keep [place] updated  I agree with increasing the home care needs. Follow-up in 6 months or sooner if worse.

## 2022-05-15 NOTE — Telephone Encounter (Signed)
Pharmacist Building services engineer) says the insurance is giving push back OF:[id number] (SEROQUEL) 25 MG tablet , she says there has to be a certain diagnosis in order for approval.  Please call pharmacy for further assistance.

## 2022-05-16 ENCOUNTER — Other Ambulatory Visit (HOSPITAL_COMMUNITY): Payer: Self-pay

## 2022-05-16 NOTE — Telephone Encounter (Signed)
Pharmacy Patient Advocate Encounter   Received notification from [facility] that prior authorization for QUEtiapine Fumarate '25MG'$  tablets is required/requested.    PA submitted on 05/16/2022 to (ins) [employer] via [place] Status is pending

## 2022-05-16 NOTE — Telephone Encounter (Signed)
Can we resend the prescription. Updated the diagnosis to Dementia with agitation. Thanks

## 2022-05-16 NOTE — Telephone Encounter (Signed)
Pharmacy Patient Advocate Encounter  Prior Authorization for QUEtiapine Fumarate '25MG'$  tablets has been approved.    PA# PA Case ID #: [id number] Effective dates: 05/01/2022 through 05/01/2023.  Spoke with Pharmacy to process.

## 2022-05-16 NOTE — Telephone Encounter (Signed)
Per telephone outreach to [facility]-they state the insurance is not covering this because they would need a diagnosis for psychosis since the PT is 89 years old.

## 2022-05-16 NOTE — Telephone Encounter (Signed)
Noted, thank you

## 2022-05-24 DIAGNOSIS — I509 Heart failure, unspecified: Secondary | ICD-10-CM | POA: Diagnosis not present

## 2022-05-24 DIAGNOSIS — I130 Hypertensive heart and chronic kidney disease with heart failure and stage 1 through stage 4 chronic kidney disease, or unspecified chronic kidney disease: Secondary | ICD-10-CM | POA: Diagnosis not present

## 2022-05-24 DIAGNOSIS — N183 Chronic kidney disease, stage 3 unspecified: Secondary | ICD-10-CM | POA: Diagnosis not present

## 2022-05-24 DIAGNOSIS — G301 Alzheimer's disease with late onset: Secondary | ICD-10-CM | POA: Diagnosis not present

## 2022-05-24 DIAGNOSIS — E039 Hypothyroidism, unspecified: Secondary | ICD-10-CM | POA: Diagnosis not present

## 2022-05-24 DIAGNOSIS — N1831 Chronic kidney disease, stage 3a: Secondary | ICD-10-CM | POA: Diagnosis not present

## 2022-05-24 DIAGNOSIS — F0280 Dementia in other diseases classified elsewhere without behavioral disturbance: Secondary | ICD-10-CM | POA: Diagnosis not present

## 2022-05-24 DIAGNOSIS — C50912 Malignant neoplasm of unspecified site of left female breast: Secondary | ICD-10-CM | POA: Diagnosis not present

## 2022-05-24 DIAGNOSIS — G319 Degenerative disease of nervous system, unspecified: Secondary | ICD-10-CM | POA: Diagnosis not present

## 2022-05-24 DIAGNOSIS — R7302 Impaired glucose tolerance (oral): Secondary | ICD-10-CM | POA: Diagnosis not present

## 2022-05-30 DIAGNOSIS — I739 Peripheral vascular disease, unspecified: Secondary | ICD-10-CM | POA: Diagnosis not present

## 2022-05-30 DIAGNOSIS — L603 Nail dystrophy: Secondary | ICD-10-CM | POA: Diagnosis not present

## 2022-05-30 DIAGNOSIS — L84 Corns and callosities: Secondary | ICD-10-CM | POA: Diagnosis not present

## 2022-06-05 ENCOUNTER — Telehealth: Payer: Self-pay | Admitting: Neurology

## 2022-06-05 MED ORDER — QUETIAPINE FUMARATE 25 MG PO TABS: 25.0000 mg | ORAL_TABLET | Freq: Every day | ORAL | 2 refills | Status: DC

## 2022-06-05 NOTE — Telephone Encounter (Signed)
Pt daughter is calling. Asking if she can give pt a whole pill of QUEtiapine Fumarate. Stated when she cut it in half it is crumbing. She is requesting a call back from nurse. Stated if you can give her a pill that already cut up then she can stay on same dose.

## 2022-06-05 NOTE — Telephone Encounter (Signed)
I called the pt's daughter back and updated on  message from Dr. April Manson.  She verbalized understanding and appreciation for the call.  I have sent updated RX as [facility] to [facility].

## 2022-06-05 NOTE — Telephone Encounter (Signed)
Ok to give full tablet.  Thanks

## 2022-06-18 ENCOUNTER — Emergency Department (HOSPITAL_COMMUNITY): Payer: Medicare PPO

## 2022-06-18 ENCOUNTER — Emergency Department (HOSPITAL_COMMUNITY)
Admission: EM | Admit: 2022-06-18 | Discharge: 2022-06-20 | Disposition: A | Discharge status: Rehabilitation Facility | Payer: Medicare PPO | Attending: Emergency Medicine | Admitting: Emergency Medicine

## 2022-06-18 ENCOUNTER — Other Ambulatory Visit: Payer: Self-pay

## 2022-06-18 DIAGNOSIS — I10 Essential (primary) hypertension: Secondary | ICD-10-CM | POA: Diagnosis not present

## 2022-06-18 DIAGNOSIS — F0390 Unspecified dementia without behavioral disturbance: Secondary | ICD-10-CM | POA: Insufficient documentation

## 2022-06-18 DIAGNOSIS — W06XXXA Fall from bed, initial encounter: Secondary | ICD-10-CM | POA: Diagnosis not present

## 2022-06-18 DIAGNOSIS — Z853 Personal history of malignant neoplasm of breast: Secondary | ICD-10-CM | POA: Insufficient documentation

## 2022-06-18 DIAGNOSIS — R531 Weakness: Secondary | ICD-10-CM | POA: Diagnosis not present

## 2022-06-18 DIAGNOSIS — M19071 Primary osteoarthritis, right ankle and foot: Secondary | ICD-10-CM | POA: Diagnosis not present

## 2022-06-18 DIAGNOSIS — M1990 Unspecified osteoarthritis, unspecified site: Secondary | ICD-10-CM | POA: Diagnosis not present

## 2022-06-18 DIAGNOSIS — E039 Hypothyroidism, unspecified: Secondary | ICD-10-CM | POA: Insufficient documentation

## 2022-06-18 DIAGNOSIS — M79671 Pain in right foot: Secondary | ICD-10-CM

## 2022-06-18 DIAGNOSIS — M25511 Pain in right shoulder: Secondary | ICD-10-CM | POA: Diagnosis not present

## 2022-06-18 DIAGNOSIS — M79673 Pain in unspecified foot: Secondary | ICD-10-CM | POA: Diagnosis not present

## 2022-06-18 DIAGNOSIS — M1380 Other specified arthritis, unspecified site: Secondary | ICD-10-CM

## 2022-06-18 DIAGNOSIS — Z79899 Other long term (current) drug therapy: Secondary | ICD-10-CM | POA: Diagnosis not present

## 2022-06-18 DIAGNOSIS — M7731 Calcaneal spur, right foot: Secondary | ICD-10-CM | POA: Diagnosis not present

## 2022-06-18 DIAGNOSIS — M2012 Hallux valgus (acquired), left foot: Secondary | ICD-10-CM | POA: Diagnosis not present

## 2022-06-18 DIAGNOSIS — M7732 Calcaneal spur, left foot: Secondary | ICD-10-CM | POA: Diagnosis not present

## 2022-06-18 DIAGNOSIS — Z043 Encounter for examination and observation following other accident: Secondary | ICD-10-CM | POA: Diagnosis not present

## 2022-06-18 DIAGNOSIS — S99921A Unspecified injury of right foot, initial encounter: Secondary | ICD-10-CM | POA: Diagnosis not present

## 2022-06-18 LAB — CBG MONITORING, ED: Glucose-Capillary: 102 mg/dL — ABNORMAL HIGH (ref 70–99)

## 2022-06-18 MED ORDER — FUROSEMIDE 40 MG PO TABS
40.0000 mg | ORAL_TABLET | Freq: Every day | ORAL | Status: DC
  Administered 2022-06-18 – 2022-06-20 (×3): 40 mg via ORAL
  Filled 2022-06-18 (×3): qty 1

## 2022-06-18 MED ORDER — HYDROCODONE-ACETAMINOPHEN 5-325 MG PO TABS: 1.0000 | ORAL_TABLET | Freq: Four times a day (QID) | ORAL | 0 refills | Status: DC | PRN

## 2022-06-18 MED ORDER — PREDNISONE 10 MG PO TABS: 40.0000 mg | ORAL_TABLET | Freq: Every day | ORAL | 0 refills | Status: DC

## 2022-06-18 MED ORDER — METOPROLOL SUCCINATE ER 25 MG PO TB24
12.5000 mg | ORAL_TABLET | Freq: Every day | ORAL | Status: DC
  Administered 2022-06-18 – 2022-06-20 (×3): 12.5 mg via ORAL
  Filled 2022-06-18: qty 1
  Filled 2022-06-18: qty 0.5
  Filled 2022-06-18: qty 1

## 2022-06-18 MED ORDER — QUETIAPINE FUMARATE 25 MG PO TABS
25.0000 mg | ORAL_TABLET | Freq: Every day | ORAL | Status: DC
  Administered 2022-06-19: 25 mg via ORAL
  Filled 2022-06-18: qty 1

## 2022-06-18 MED ORDER — LEVOTHYROXINE SODIUM 50 MCG PO TABS
50.0000 ug | ORAL_TABLET | Freq: Every day | ORAL | Status: DC
  Administered 2022-06-18 – 2022-06-20 (×3): 50 ug via ORAL
  Filled 2022-06-18 (×3): qty 1

## 2022-06-18 MED ORDER — ONDANSETRON 4 MG PO TBDP: 4.0000 mg | ORAL_TABLET | Freq: Three times a day (TID) | ORAL | 1 refills | Status: DC | PRN

## 2022-06-18 MED ORDER — POTASSIUM CHLORIDE CRYS ER 20 MEQ PO TBCR
40.0000 meq | EXTENDED_RELEASE_TABLET | Freq: Two times a day (BID) | ORAL | Status: DC
  Administered 2022-06-19 – 2022-06-20 (×3): 40 meq via ORAL
  Filled 2022-06-18 (×2): qty 2

## 2022-06-18 MED ORDER — HALOPERIDOL LACTATE 5 MG/ML IJ SOLN
2.5000 mg | Freq: Once | INTRAMUSCULAR | Status: AC
  Administered 2022-06-18: 2.5 mg via INTRAMUSCULAR
  Filled 2022-06-18: qty 1

## 2022-06-18 NOTE — TOC Initial Note (Signed)
Transition of Care [facility]) - Initial/Assessment Note    Patient Details  Name: [name] MRN: [id number] Date of Birth: [birth year]  [facility] CM/SW Contact:    Verdell Carmine, RN Phone Number: 06/18/2022, 4:59 PM  Clinical Narrative:                  Patient is a 89 year old with advanced dementia  that is being cared for at home b family members. She slipped off the bed, injuring her foot. She is unable to get into the car, family is requesting SNF placement.  We will have to obtain a PT consult to see what their recommendations are and board in the ED  until evaluation.   Of note, ED does not routinely place patients needing LTC.      Patient Goals and CMS Choice            Expected Discharge Plan and Services                                              Prior Living Arrangements/Services                       Activities of Daily Living      Permission Sought/Granted                  Emotional Assessment              Admission diagnosis:  FALL Patient Active Problem List   Diagnosis Date Noted   History of colon cancer 04/01/2021   Breast cancer, stage 1, estrogen receptor positive, left ([place]) 03/23/2020   Malignant neoplasm of upper-outer quadrant of left breast in female, estrogen receptor positive ([place]) 12/29/2019   Chronic right shoulder pain 05/08/2017   Acute pain of right shoulder 03/12/2017   Allergic dermatitis 12/01/2016   Chiggers 12/01/2016   Infected insect bites of multiple sites 12/01/2016   SVT (supraventricular tachycardia) 07/09/2014   Essential hypertension 07/06/2014   CKD (chronic kidney disease) stage 3, GFR 30-59 ml/min (HCC) 06/04/2014   Skin rash 04/08/2014   Edema leg 03/30/2014   Hypothyroidism 03/09/2014   Chronic anticoagulation 01/23/2014   Ejection fraction    Atrial flutter ([place]) 01/03/2014   Chest pressure 01/03/2014   H/O Bell's palsy    PCP:  Haywood Pao,  MD [id number] [place] [id number]: [phone]:[phone]    Social Determinants of Health (SDOH) Social History: SDOH Screenings   Food Insecurity: No Food Insecurity (12/31/2019)  Housing: Low Risk  (12/31/2019)  Transportation Needs: No Transportation Needs (12/31/2019)  Depression (PHQ2-9): Low Risk  (12/31/2019)  Financial Resource Strain: Low Risk  (12/31/2019)  Tobacco Use: Medium Risk (05/15/2022)   SDOH Interventions:     Readmission Risk Interventions     No data to display

## 2022-06-18 NOTE — ED Triage Notes (Addendum)
Pt BIBA from home. Pt slid down from bed, unknown if pt hit head, no obvious bruising noted.. Pt has been unable to bear weight on L foot (reason for fall), and is c/o pain in R shoulder. Pt lives at home alone.   Pt has worsening dementia, daughter thinks it may be time to consider a facility.  Pt not on blood thinners.  AOx3

## 2022-06-18 NOTE — Discharge Instructions (Signed)
Take the prednisone as directed.  If able treat with the hydrocodone and Zofran for any nausea or vomiting as needed for the pain.  Contact her doctor tomorrow about her dementia medicines.  Return for any new or worse symptoms.  X-rays had no evidence of any bony abnormalities.  Presentation seems to be consistent with inflammatory arthritis cannot completely rule out a cellulitis.  But most likely inflammatory arthritis.

## 2022-06-18 NOTE — ED Provider Notes (Signed)
I did not receive signout on this patient as this patient was arranged for discharge by the start of my shift.  I received an update from the nursing team that the family has arrived and is declining to take the family member home as they do not feel they can provide safe care for the patient.  Will engage social work and place patient in transitions of care boarding protocol.   Tretha Sciara, MD 06/18/22 [phone]

## 2022-06-18 NOTE — ED Notes (Signed)
Attempted to transfer pt into car. Pt was unable to. Daughter states she is not able to provide the amt of care the pt now needs at home. Pt will need TOC consult.

## 2022-06-18 NOTE — ED Provider Notes (Signed)
[facility] EMERG[facility] DE[facility] AT [facility] Provider Note   CSN: [id number] Arrival date & time: 06/18/22  1212     History  Chief Complaint  Patient presents with   [name] is a 89 y.o. female.  Patient slid out of the bed.  Because we would not put weight on her right foot.  Patient denies any injuries.  The patient has significant dementia.  Family member here dementia is baseline.  There was some question of complaint of right shoulder pain the patient moves both upper extremities without any difficulty at all.  Patient lives with family members and has long-term.  Patient not on any blood thinners.  Family is willing to take her back.  Patient had recent adjustment in her dementia medicine where they have increased and they think maybe things are worse.  Other can to contact her regular doctor tomorrow and see if they want to drop it back down to half because she seemed to do better on that.  Past medical history significant for dementia hypertension atrial flutter patient not on a blood thinner.  Hypothyroidism history of breast cancer left with recurrence but in remission.       Home Medications Prior to Admission medications   Medication Sig Start Date End Date Taking? Authorizing Provider  acetaminophen (TYLENOL) 500 MG tablet Take 1,000 mg by mouth 2 (two) times daily as needed. Mainly just two at night.   Yes [provider]  anastrozole (ARIMIDEX) 1 MG tablet Take 1 tablet (1 mg total) by mouth daily. 09/29/21  Yes Nicholas Lose, MD  calcium carbonate (CALTRATE 600) 1500 (600 Ca) MG TABS tablet Take 1 tablet (1,500 mg total) by mouth daily with breakfast. 09/29/20  Yes Nicholas Lose, MD  diphenhydrAMINE (BENYLIN) 12.5 MG/5ML syrup Take 10 mLs (25 mg total) by mouth every 4 (four) hours as needed for itching or allergies. 09/21/19  Yes Ripley Fraise, MD  furosemide (LASIX) 40 MG tablet TAKE 1 TABLET (40 MG TOTAL) BY MOUTH 2 (TWO) TIMES  DAILY. Patient taking differently: Take 40 mg by mouth daily. TAKE 1 TABLET (40 MG TOTAL) BY MOUTH 2 (TWO) TIMES DAILY. 04/26/16  Yes Jerline Pain, MD  HYDROcodone-acetaminophen (NORCO/VICODIN) 5-325 MG tablet Take 1 tablet by mouth every 6 (six) hours as needed for moderate pain. 06/18/22  Yes Fredia Sorrow, MD  levothyroxine (SYNTHROID, LEVOTHROID) 50 MCG tablet Take 50 mcg by mouth daily. 03/16/14  Yes [provider]  metoprolol succinate (TOPROL-XL) 25 MG 24 hr tablet TAKE 1/2 TABLET BY MOUTH EVERY DAY (TAKE WITH A MEAL OR IMMEDIATELY FOLLOWING A MEAL) Patient taking differently: Take 12.5 mg by mouth daily. TAKE 1/2 TABLET BY MOUTH EVERY DAY (TAKE WITH A MEAL OR IMMEDIATELY FOLLOWING A MEAL) 08/25/15  Yes Jerline Pain, MD  Multiple Vitamins-Minerals (OCUVITE EYE HEATLH GUMMIES PO) Take 2 tablets by mouth daily.   Yes [provider]  ondansetron (ZOFRAN-ODT) 4 MG disintegrating tablet Take 1 tablet (4 mg total) by mouth every 8 (eight) hours as needed for nausea or vomiting. 06/18/22  Yes Fredia Sorrow, MD  potassium chloride SA (KLOR-CON M20) 20 MEQ tablet Take 2 tablets (40 mEq total) by mouth 2 (two) times daily. 04/28/15  Yes Jerline Pain, MD  predniSONE (DELTASONE) 10 MG tablet Take 4 tablets (40 mg total) by mouth daily. 06/18/22  Yes Fredia Sorrow, MD  QUEtiapine (SEROQUEL) 25 MG tablet Take 1 tablet (25 mg total) by mouth at  bedtime. 06/05/22  Yes Alric Ran, MD  vitamin B-12 (CYANOCOBALAMIN) 1000 MCG tablet Take 1,000 mcg by mouth daily.   Yes [provider]      Allergies    Codeine, Exelon [rivastigmine], Sulfa antibiotics, and Sulfamethoxazole-trimethoprim    Review of Systems   Review of Systems  Unable to perform ROS: Dementia    Physical Exam Updated Vital Signs BP 139/89 (BP Location: Right Arm)   Pulse 82   Temp 98.2 F (36.8 C) (Oral)   Resp 16   Ht 1.651 m (5' 5"$ )   Wt 74.8 kg   SpO2 96%   BMI 27.46 kg/m  Physical  Exam Vitals and nursing note reviewed.  Constitutional:      General: She is not in acute distress.    Appearance: She is well-developed.  HENT:     Head: Normocephalic and atraumatic.     Mouth/Throat:     Mouth: Mucous membranes are moist.  Eyes:     Conjunctiva/sclera: Conjunctivae normal.  Cardiovascular:     Rate and Rhythm: Normal rate and regular rhythm.     Heart sounds: No murmur heard. Pulmonary:     Effort: Pulmonary effort is normal. No respiratory distress.     Breath sounds: Normal breath sounds.  Abdominal:     Palpations: Abdomen is soft.     Tenderness: There is no abdominal tenderness.  Musculoskeletal:        General: Swelling and tenderness present.     Cervical back: Neck supple.     Comments: Left foot has a hammertoe.  But no erythema no obvious tenderness.  Good cap refill.  Slight swelling to to the leg.  The right foot has a lot of medial redness around the area of the ankle and is warm to touch.  It is tender.  No obvious deformity.  Good cap refill to the toes.  Patient with good movement of all the other extremities.  Skin:    General: Skin is warm and dry.     Capillary Refill: Capillary refill takes less than 2 seconds.  Neurological:     General: No focal deficit present.     Mental Status: She is alert. Mental status is at baseline.  Psychiatric:        Mood and Affect: Mood normal.     ED Results / Procedures / Treatments   Labs (all labs ordered are listed, but only abnormal results are displayed) Labs Reviewed  CBG MONITORING, ED - Abnormal; Notable for the following components:      Result Value   Glucose-Capillary 102 (*)    All other components within normal limits    EKG None  Radiology DG Foot Complete Right  Result Date: 06/18/2022 CLINICAL DATA:  Slid from bed.  Unable to bear weight on feet. EXAM: RIGHT FOOT COMPLETE - 3+ VIEW COMPARISON:  None Available. FINDINGS: No fracture.  No bone lesion. Joints normally spaced and  aligned. Small plantar calcaneal spur. Skeletal structures are demineralized. Soft tissues are unremarkable. IMPRESSION: 1. No fracture or acute finding. Electronically Signed   By: Lajean Manes M.D.   On: 06/18/2022 13:46   DG Ankle Complete Left  Result Date: 06/18/2022 CLINICAL DATA:  Pt BIB EMS from home. Pt slid down from bed and has been unable to bear weight on her feet. Pt had a reddened area to the medial aspect of her right ankle and foot, unknown length of time. Best obtainable images with technologist holding patient. fall  EXAM: LEFT ANKLE COMPLETE - 3+ VIEW COMPARISON:  None Available. FINDINGS: Ankle mortise intact. The talar dome is normal. No malleolar fracture. The calcaneus is normal. IMPRESSION: No fracture or dislocation. Electronically Signed   By: Suzy Bouchard M.D.   On: 06/18/2022 13:46   DG Ankle Complete Right  Result Date: 06/18/2022 CLINICAL DATA:  Fall EXAM: RIGHT ANKLE - COMPLETE 3 VIEW COMPARISON:  None Available. FINDINGS: There is no evidence of fracture, dislocation, or joint effusion. There is no evidence of arthropathy or other focal bone abnormality. Soft tissues are unremarkable. Plantar calcaneal spur. IMPRESSION: Plantar calcaneal spur.  No acute osseous abnormalities. Electronically Signed   By: Sammie Bench M.D.   On: 06/18/2022 13:46   DG Foot Complete Left  Result Date: 06/18/2022 CLINICAL DATA:  Pain EXAM: LEFT FOOT - COMPLETE 3+ VIEW COMPARISON:  None Available. FINDINGS: Osseous structures appear osteopenic. There is severe hallux valgus and hammertoe deformities. No acute fracture identified. No bony destructive processes are seen. No erosive changes or soft tissue calcifications. There is a small plantar calcaneal spur. IMPRESSION: Osteopenia. Severe hallux valgus. Hammertoe deformities. No acute osseous abnormalities. Electronically Signed   By: Sammie Bench M.D.   On: 06/18/2022 13:45    Procedures Procedures    Medications Ordered in  ED Medications - No data to display  ED Course/ Medical Decision Making/ A&P                             Medical Decision Making Amount and/or Complexity of Data Reviewed Radiology: ordered.  Risk Prescription drug management.  Clinically it seems like the right foot at the ankle area which may be an inflammatory Tritus doubt it is a cellulitis.  Because of this so warm to touch and so isolated to the area and there is no skin break there.  Seems to be the culprit.  X-rays of both feet and ankles without any bony abnormalities.  Patient's blood sugar was 102.  Little challenging situation for the family members because the patient is very argumentative.  But they are willing to take her back home.  She has been refusing to take some meds.  But they think they can get some prednisone in her with something like ice cream or something she really likes.  Try prednisone.  Also gave a course of hydrocodone and Zofran if they are able to get that into her.  But they understand that the prednisone is most important.  They also know it is okay for her to use the foot or leg nothing can make that worse.  Recommend follow-up with her primary care doctor just for recheck.  Also, contact her doctor about her dementia medicine tomorrow to see if they want to make an adjustment because it seems as if the increase is made her worse.   Final Clinical Impression(s) / ED Diagnoses Final diagnoses:  Right foot pain  Inflammatory arthritis    Rx / DC Orders ED Discharge Orders          Ordered    predniSONE (DELTASONE) 10 MG tablet  Daily        06/18/22 1448    HYDROcodone-acetaminophen (NORCO/VICODIN) 5-325 MG tablet  Every 6 hours PRN        06/18/22 1448    ondansetron (ZOFRAN-ODT) 4 MG disintegrating tablet  Every 8 hours PRN        06/18/22 1448  Fredia Sorrow, MD 06/18/22 [phone]
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
## 2022-06-19 NOTE — Evaluation (Signed)
Physical Therapy Evaluation Patient Details Name: [name] MRN: [id number] DOB: 09-12-1927 Today's Date: 06/19/2022  History of Present Illness  89 yo female presents to therapy from ED secondary to fall OOB and pt unable to WB on L foot since that time. Pt also c/o R shoulder pain. Imaging negative for acute findings for L foot and ankle. for Pt lives at home with caregiver assist. Daughter concerned per progression of dementia with recent medication changes and concern per family ability to provide pt care. Pt PMH including but not limited to: HTN, A-flutter, hypothyroidism and L BaCa.  Clinical Impression  Pt presented to ED on 06/16/2022 secondary to problem above with deficits below. Patient was at home with caregiver support 9-1 and 5-7 with family assisting PRN, amb intermittently with AD and required A for all BADLs and IADLs. Pt poor historian and PT called daughter per PLOF and anticipated d/c plans including transitioning home for more familiar environment with 24/7 S and A. Pt did not demonstrate pain behaviors  with functional mobility tasks.  Pt currently requires min A for bed mobility, transfers and gait tasks.  Anticipate patient will benefit from PT to address problems listed below. Will continue to follow acutely to maximize functional mobility independence and safety.          Recommendations for follow up therapy are one component of a multi-disciplinary discharge planning process, led by the attending physician.  Recommendations may be updated based on patient status, additional functional criteria and insurance authorization.  Follow Up Recommendations Home health PT      Assistance Recommended at Discharge Frequent or constant Supervision/Assistance  Patient can return home with the following  A little help with walking and/or transfers;A little help with bathing/dressing/bathroom;Assistance with cooking/housework;Direct supervision/assist for medications  management;Direct supervision/assist for financial management;Assist for transportation;Help with stairs or ramp for entrance    Equipment Recommendations Other (comment) (daughter reports pt has DME in home settting)  Recommendations for Other Services       Functional Status Assessment Patient has had a recent decline in their functional status and demonstrates the ability to make significant improvements in function in a reasonable and predictable amount of time.     Precautions / Restrictions Precautions Precautions: Fall;Other (comment) (percived behaviors) Restrictions Weight Bearing Restrictions: No      Mobility  Bed Mobility Overal bed mobility: Needs Assistance Bed Mobility: Supine to Sit, Sidelying to Sit   Sidelying to sit: Min assist Supine to sit: Max assist          Transfers Overall transfer level: Needs assistance Equipment used: Rolling walker (2 wheels) Transfers: Sit to/from Stand Sit to Stand: Min assist           General transfer comment: cues and increaed time    Ambulation/Gait Ambulation/Gait assistance: Min assist Gait Distance (Feet): 15 Feet Assistive device: Rolling walker (2 wheels) Gait Pattern/deviations: Step-to pattern, Shuffle, Wide base of support Gait velocity: decreased     General Gait Details: pt required assist for RW management, cues for proper distance from RW and increased time for all functional moblity tasks with ongoing cues for attention  Stairs            Wheelchair Mobility    Modified Rankin (Stroke Patients Only)       Balance Overall balance assessment: Needs assistance Sitting-balance support: Feet supported Sitting balance-Leahy Scale: Fair     Standing balance support: Bilateral upper extremity supported, Reliant on assistive device for balance Standing balance-Leahy  Scale: Poor                               Pertinent Vitals/Pain Pain Assessment Pain Assessment:  Faces Pain Score: 0-No pain Faces Pain Scale: No hurt    Home Living Family/patient expects to be discharged to:: Private residence Living Arrangements: Alone;Other (Comment) (with caregiver assist 9-1 and 5-7) Available Help at Discharge: Family;Personal care attendant Type of Home: House Home Access: Stairs to enter Entrance Stairs-Rails: Right Entrance Stairs-Number of Steps: 3   Home Layout: One level Home Equipment: Conservation officer, nature (2 wheels);Rollator (4 wheels);Wheelchair - manual      Prior Function Prior Level of Function : Needs assist;History of Falls (last six months);Patient poor historian/Family not available  Cognitive Assist : ADLs (cognitive)   ADLs (Cognitive): Step by step cues Physical Assist : ADLs (physical)   ADLs (physical): Grooming;Bathing;Dressing;Toileting;IADLs   ADLs Comments: Daughter reports pt requires assist for ADLs, cues for tasks and sequencing, has assist for medication, meal prep and IADLs. Pt amb intermittently with AD in home.     Hand Dominance        Extremity/Trunk Assessment        Lower Extremity Assessment Lower Extremity Assessment: Difficult to assess due to impaired cognition       Communication   Communication: HOH  Cognition Arousal/Alertness: Awake/alert Behavior During Therapy: Impulsive, Anxious Overall Cognitive Status: History of cognitive impairments - at baseline                                          General Comments      Exercises     Assessment/Plan    PT Assessment Patient needs continued PT services  PT Problem List Decreased strength;Decreased activity tolerance;Decreased balance;Decreased mobility;Decreased coordination;Decreased cognition;Decreased knowledge of use of DME       PT Treatment Interventions DME instruction;Gait training;Stair training;Functional mobility training;Therapeutic activities;Therapeutic exercise;Balance training;Neuromuscular  re-education;Patient/family education    PT Goals (Current goals can be found in the Care Plan section)  Acute Rehab PT Goals Patient Stated Goal: get down stairs and go home (due to pt performance at PT eval today Daugher reports pt will transition home with increased caregiver support) PT Goal Formulation: With patient/family Time For Goal Achievement: 07/04/22 Potential to Achieve Goals: Good    Frequency Min 3X/week     Co-evaluation               AM-PAC PT "6 Clicks" Mobility  Outcome Measure Help needed turning from your back to your side while in a flat bed without using bedrails?: A Little Help needed moving from lying on your back to sitting on the side of a flat bed without using bedrails?: A Little Help needed moving to and from a bed to a chair (including a wheelchair)?: A Little Help needed standing up from a chair using your arms (e.g., wheelchair or bedside chair)?: A Little Help needed to walk in hospital room?: A Little Help needed climbing 3-5 steps with a railing? : A Lot 6 Click Score: 17    End of Session Equipment Utilized During Treatment: Gait belt Activity Tolerance: No increased pain;Patient tolerated treatment well Patient left: in bed;with bed alarm set;with nursing/sitter in room Nurse Communication: Mobility status PT Visit Diagnosis: Unsteadiness on feet (R26.81);Other abnormalities of gait and mobility (R26.89);Muscle weakness (generalized) (M62.81);History  of falling (Z91.81);Difficulty in walking, not elsewhere classified (R26.2)    Time: 1044-1100 PT Time Calculation (min) (ACUTE ONLY): 16 min   Charges:   PT Evaluation $PT Eval Low Complexity: [place], PT   Adair Patter 06/19/2022, 12:58 PM

## 2022-06-19 NOTE — ED Notes (Signed)
PT at bedside to eval patient. Daughter in room.

## 2022-06-19 NOTE — ED Notes (Signed)
ED Provider at bedside. 

## 2022-06-19 NOTE — ED Notes (Signed)
While attempting to get patient into a wheelchair, patient was requiring assist of two. Patient was also having difficulty standing on her own. Her daughter, [name] at bedside stated to this RN that she does not feel comfortable taking her home anymore. Her daughter states "I was under the impression she was moving around better than this, basically on her own. If she can't move around on her own I can't take her home it's only me since my husband is sick he can't help" MD and care manager made aware

## 2022-06-19 NOTE — Progress Notes (Signed)
Transition of Care [facility]) - Emergency Department Mini Assessment   Patient Details  Name: [name] MRN: [id number] Date of Birth: [birth year]  [facility]) CM/SW Contact:    [name], RN Phone Number: 06/19/2022, 3:44 PM   Clinical Narrative: This RNCM received secure chat from RN Lyly who reports patient daughter here to pick patient up for discharge home with HHPT. Patient's daughter does not feel safe because she can not get her mother in the wheelchair on her own. Per chart review yesterday patient was discharged however daughter unable to get her in the car, resulting in PT eval. PT recommended HHPT today.  This RNCM notified PT to re-evaluate patient while patient's daughter is in the room. PT is currently on another floor however will come back to re-evaluate patient.  This RNCM notified RN and patient's daughter of PT re-evaluation. This RNCM explained to patient's daughter next steps will be based on PT and MD recommendations. This RNCM discussed transporting patient home with safe transport or PTAR. This RNCM also discussed getting a bedside commode for patient. Patient's daughter agrees and stated she prefers her mother be at home.   This RNCM will continue to follow.   ED Mini Assessment: What brought you to the Emergency Department? : return home  Barriers to Discharge: No Barriers Identified  Barrier interventions: coordinated home health services     Interventions which prevented an admission or readmission: [place] or Services    Patient Contact and Communications     Spoke with: [name] (daughter)  ,     Sport and exercise psychologist Phone Number: [phone]    Patient states their goals for this hospitalization and ongoing recovery are:: return home CMS Medicare.gov Compare Post Acute Care list provided to:: Patient Represenative (must comment) [name] ( daughter)) Choice offered to / list presented to : Adult  Children  Admission diagnosis:  FALL Patient Active Problem List   Diagnosis Date Noted   History of colon cancer 04/01/2021   Breast cancer, stage 1, estrogen receptor positive, left ([place]) 03/23/2020   Malignant neoplasm of upper-outer quadrant of left breast in female, estrogen receptor positive ([place]) 12/29/2019   Chronic right shoulder pain 05/08/2017   Acute pain of right shoulder 03/12/2017   Allergic dermatitis 12/01/2016   Chiggers 12/01/2016   Infected insect bites of multiple sites 12/01/2016   SVT (supraventricular tachycardia) 07/09/2014   Essential hypertension 07/06/2014   CKD (chronic kidney disease) stage 3, GFR 30-59 ml/min (HCC) 06/04/2014   Skin rash 04/08/2014   Edema leg 03/30/2014   Hypothyroidism 03/09/2014   Chronic anticoagulation 01/23/2014   Ejection fraction    Atrial flutter ([place]) 01/03/2014   Chest pressure 01/03/2014   H/O Bell's palsy    PCP:  Tisovec, Fransico Him, MD Pharmacy:   [id number] [address] [id number]: [phone]: [phone]

## 2022-06-19 NOTE — ED Notes (Signed)
Pt is currently sleeping

## 2022-06-19 NOTE — Progress Notes (Signed)
Awaiting PT.  

## 2022-06-19 NOTE — ED Notes (Signed)
Unable to obtain vitals d/t patient being uncooperative. Will try again later. Pt laying in bed MAE, chest rise and fall noted. No acute distress noted.

## 2022-06-19 NOTE — ED Notes (Signed)
Sitter given breakfast tray to assist patient.

## 2022-06-19 NOTE — ED Notes (Signed)
Sitter at bedside 

## 2022-06-19 NOTE — ED Notes (Signed)
Patient's daughter Pamala Hurry at bedside stating she is ready to take the patient home. MD Tomi Bamberger made aware
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
## 2022-06-19 NOTE — ED Notes (Signed)
Pt sleeping, no signs of distress.

## 2022-06-19 NOTE — Discharge Planning (Signed)
Transition of Care [facility]) - Emergency Department Mini Assessment   Patient Details  Name: [name] MRN: [id number] Date of Birth: 01-31-1928  Transition of Care [facility]) CM/SW Contact:    [name], RN Phone Number: 06/19/2022, 1:22 PM   Clinical Narrative: Jackqul[facility] [place] consult for SNF placement. Per chart review patient is advanced dementia. This RNCM spoke with patient's daughter Pamala Hurry to advise PT has recommended HHPT. Patient's daughter Pamala Hurry reports patient currently has HHA 9a-1p, 5p-7p daily as well as patient's daughter assist with care. This RNCM offered choice for HHPT services, Pamala Hurry chose any [place] agency that will accept patient's insurance. Patient's daughter reports they will work getting patient a hospital bed. This RNCM advised to contact patient's PCP when they are ready to get that set up.   This RNCM notified Claiborne Billings with [facility] who will follow patient at discharge for [place]. [facility] contact information on AVS.   Will notify EDP for HHPT orders, awaiting HHPT orders.     ED Mini Assessment: What brought you to the Emergency Department? : return home  Barriers to Discharge: No Barriers Identified  Barrier interventions: coordinated home health services     Interventions which prevented an admission or readmission: [place] or Services    Patient Contact and Communications     Spoke with: [name] (daughter)  ,     Sport and exercise psychologist Phone Number: [phone]    Patient states their goals for this hospitalization and ongoing recovery are:: return home CMS Medicare.gov Compare Post Acute Care list provided to:: Patient Represenative (must comment) [name] ( daughter)) Choice offered to / list presented to : Adult Children  Admission diagnosis:  FALL Patient Active Problem List   Diagnosis Date Noted   History of colon cancer 04/01/2021   Breast cancer, stage 1, estrogen receptor positive, left ([place]) 03/23/2020   Malignant  neoplasm of upper-outer quadrant of left breast in female, estrogen receptor positive ([place]) 12/29/2019   Chronic right shoulder pain 05/08/2017   Acute pain of right shoulder 03/12/2017   Allergic dermatitis 12/01/2016   Chiggers 12/01/2016   Infected insect bites of multiple sites 12/01/2016   SVT (supraventricular tachycardia) 07/09/2014   Essential hypertension 07/06/2014   CKD (chronic kidney disease) stage 3, GFR 30-59 ml/min (HCC) 06/04/2014   Skin rash 04/08/2014   Edema leg 03/30/2014   Hypothyroidism 03/09/2014   Chronic anticoagulation 01/23/2014   Ejection fraction    Atrial flutter ([place]) 01/03/2014   Chest pressure 01/03/2014   H/O Bell's palsy    PCP:  Tisovec, Fransico Him, MD Pharm[facility]   [id number] [id number]: [phone]: [phone]

## 2022-06-19 NOTE — ED Notes (Signed)
Pt changed, linens changed. Pt continues to fight staff and tries to bite along with kicking and hitting.

## 2022-06-19 NOTE — Progress Notes (Addendum)
Transition of Care [facility]) - Emergency Department Mini Assessment   Patient Details  Name: [name] MRN: [id number] Date of Birth: 1927-07-20  Transition of Care [facility]) CM/SW Contact:    [name], RN Phone Number: 06/19/2022, 6:31 PM   Clinical Narrative: Patient was re-evaluated by PT who recommends short term SNF with potential for LTC. The patient does not have LTC insurance. This RNCM spoke with patient's daughter to advise of next steps and PT recommendation. This RNCM explained to patient's daughter that patient an can not be placed long term from the ED, that has be done within the community with the proper insurance coverage or family will need to pay oop. This RNCM offered a referral for [facility] to assist within the community for LTC placement. Patient's daughter request to start the short term SNF placement process due to she prefers short term vs. LTC, declined referral to [facility] at this time.   This RNCM explained the short term SNF placement process and if patient does not get any bed offers patient will need to go home with HHPT that has been arranged with [facility].  Notified EDP to sign FL2. Patient faxed out in [place]. TOC will continue to follow, awaiting bed offers, insurance auth.   ED Mini Assessment: What brought you to the Emergency Department? : return home  Barriers to Discharge: No Barriers Identified  Barrier interventions: coordinated home health services     Interventions which prevented an admission or readmission: [place] or Services    Patient Contact and Communications     Spoke with: [name] (daughter)  ,     Sport and exercise psychologist Phone Number: [phone]    Patient states their goals for this hospitalization and ongoing recovery are:: return home CMS Medicare.gov Compare Post Acute Care list provided to:: Patient Represenative (must comment) [name] ( daughter)) Choice offered to / list presented to :  Adult Children  Admission diagnosis:  FALL Patient Active Problem List   Diagnosis Date Noted   History of colon cancer 04/01/2021   Breast cancer, stage 1, estrogen receptor positive, left ([place]) 03/23/2020   Malignant neoplasm of upper-outer quadrant of left breast in female, estrogen receptor positive ([place]) 12/29/2019   Chronic right shoulder pain 05/08/2017   Acute pain of right shoulder 03/12/2017   Allergic dermatitis 12/01/2016   Chiggers 12/01/2016   Infected insect bites of multiple sites 12/01/2016   SVT (supraventricular tachycardia) 07/09/2014   Essential hypertension 07/06/2014   CKD (chronic kidney disease) stage 3, GFR 30-59 ml/min (HCC) 06/04/2014   Skin rash 04/08/2014   Edema leg 03/30/2014   Hypothyroidism 03/09/2014   Chronic anticoagulation 01/23/2014   Ejection fraction    Atrial flutter ([place]) 01/03/2014   Chest pressure 01/03/2014   H/O Bell's palsy    PCP:  Tisovec, Fransico Him, MD [facility]   [id number] [address] [id number]: [phone]: [phone]

## 2022-06-19 NOTE — [place] FL2 (Signed)
[place] MEDICAID FL2 LEVEL OF CARE FORM     IDENTIFICATION  Patient Name: [name] Birthdate: [birth year] Sex: female Admission Date (Current Location): 06/18/2022  [facility] and [place] Number:  Herbalist and Address:  [facility],  [address] [place], [place]      Provider Number: [id number]  Attending Physician Name and Address:  Default, Provider, MD  Relative Name and Phone Number:  Dereck Leep [phone]    Current Level of Care: Hospital Recommended Level of Care: [place] Prior Approval Number:    Date Approved/Denied:   PASRR Number: PASRR:  [id number]  Discharge Plan: SNF    Current Diagnoses: Patient Active Problem List   Diagnosis Date Noted   History of colon cancer 04/01/2021   Breast cancer, stage 1, estrogen receptor positive, left ([place]) 03/23/2020   Malignant neoplasm of upper-outer quadrant of left breast in female, estrogen receptor positive ([place]) 12/29/2019   Chronic right shoulder pain 05/08/2017   Acute pain of right shoulder 03/12/2017   Allergic dermatitis 12/01/2016   Chiggers 12/01/2016   Infected insect bites of multiple sites 12/01/2016   SVT (supraventricular tachycardia) 07/09/2014   Essential hypertension 07/06/2014   CKD (chronic kidney disease) stage 3, GFR 30-59 ml/min (HCC) 06/04/2014   Skin rash 04/08/2014   Edema leg 03/30/2014   Hypothyroidism 03/09/2014   Chronic anticoagulation 01/23/2014   Ejection fraction    Atrial flutter ([place]) 01/03/2014   Chest pressure 01/03/2014   H/O Bell's palsy     Orientation RESPIRATION BLADDER Height & Weight     Self, Place  Normal Continent Weight: 74.8 kg Height:  5' 5"$  (165.1 cm)  BEHAVIORAL SYMPTOMS/MOOD NEUROLOGICAL BOWEL NUTRITION STATUS      Continent Diet  AMBULATORY STATUS COMMUNICATION OF NEEDS Skin   Limited Assist Verbally Normal                       Personal Care Assistance Level of Assistance   Bathing, Feeding, Dressing Bathing Assistance: Limited assistance Feeding assistance: Limited assistance Dressing Assistance: Limited assistance     Functional Limitations Info  Sight, Hearing, Speech Sight Info: Adequate Hearing Info: Impaired (hard of hearing: hearing aids) Speech Info: Adequate    SPECIAL CARE FACTORS FREQUENCY  PT (By licensed PT), OT (By licensed OT)     PT Frequency: 5x per week OT Frequency: 5x per week            Contractures Contractures Info: Not present    Additional Factors Info  Code Status, Allergies Code Status Info: Full Allergies Info: Codeine, Exelon (Rivastigmine), Sulfa Antibiotics, Sulfamethoxazole-trimethoprim           Current Medications (06/19/2022):  This is the current hospital active medication list Current Facility-Administered Medications  Medication Dose Route Frequency Provider Last Rate Last Admin   furosemide (LASIX) tablet 40 mg  40 mg Oral Daily Tretha Sciara, MD   40 mg at 06/19/22 0926   levothyroxine (SYNTHROID) tablet 50 mcg  50 mcg Oral Daily Tretha Sciara, MD   50 mcg at 06/19/22 0926   metoprolol succinate (TOPROL-XL) 24 hr tablet 12.5 mg  12.5 mg Oral Daily Countryman, Chase, MD   12.5 mg at 06/19/22 0926   potassium chloride SA (KLOR-CON M) CR tablet 40 mEq  40 mEq Oral BID Tretha Sciara, MD   40 mEq at 06/19/22 0926   QUEtiapine (SEROQUEL) tablet 25 mg  25 mg Oral QHS Tretha Sciara, MD  Current Outpatient Medications  Medication Sig Dispense Refill   acetaminophen (TYLENOL) 500 MG tablet Take 1,000 mg by mouth 2 (two) times daily as needed. Mainly just two at night.     anastrozole (ARIMIDEX) 1 MG tablet Take 1 tablet (1 mg total) by mouth daily. 90 tablet 4   calcium carbonate (CALTRATE 600) 1500 (600 Ca) MG TABS tablet Take 1 tablet (1,500 mg total) by mouth daily with breakfast.  0   diphenhydrAMINE (BENYLIN) 12.5 MG/5ML syrup Take 10 mLs (25 mg total) by mouth every 4 (four) hours  as needed for itching or allergies. 120 mL 0   furosemide (LASIX) 40 MG tablet TAKE 1 TABLET (40 MG TOTAL) BY MOUTH 2 (TWO) TIMES DAILY. (Patient taking differently: Take 40 mg by mouth daily. TAKE 1 TABLET (40 MG TOTAL) BY MOUTH 2 (TWO) TIMES DAILY.) 30 tablet 0   HYDROcodone-acetaminophen (NORCO/VICODIN) 5-325 MG tablet Take 1 tablet by mouth every 6 (six) hours as needed for moderate pain. 10 tablet 0   levothyroxine (SYNTHROID, LEVOTHROID) 50 MCG tablet Take 50 mcg by mouth daily.  5   metoprolol succinate (TOPROL-XL) 25 MG 24 hr tablet TAKE 1/2 TABLET BY MOUTH EVERY DAY (TAKE WITH A MEAL OR IMMEDIATELY FOLLOWING A MEAL) (Patient taking differently: Take 12.5 mg by mouth daily. TAKE 1/2 TABLET BY MOUTH EVERY DAY (TAKE WITH A MEAL OR IMMEDIATELY FOLLOWING A MEAL)) 45 tablet 2   Multiple Vitamins-Minerals (OCUVITE EYE HEATLH GUMMIES PO) Take 2 tablets by mouth daily.     ondansetron (ZOFRAN-ODT) 4 MG disintegrating tablet Take 1 tablet (4 mg total) by mouth every 8 (eight) hours as needed for nausea or vomiting. 12 tablet 1   potassium chloride SA (KLOR-CON M20) 20 MEQ tablet Take 2 tablets (40 mEq total) by mouth 2 (two) times daily. 360 tablet 3   predniSONE (DELTASONE) 10 MG tablet Take 4 tablets (40 mg total) by mouth daily. 20 tablet 0   QUEtiapine (SEROQUEL) 25 MG tablet Take 1 tablet (25 mg total) by mouth at bedtime. 30 tablet 2   vitamin B-12 (CYANOCOBALAMIN) 1000 MCG tablet Take 1,000 mcg by mouth daily.       Discharge Medications: Please see After visit summary for a list of discharge medications.  Relevant Imaging Results:  Relevant Lab Results:   Additional Information SSN: [id number] See medication list for psych medications  [name], RN

## 2022-06-19 NOTE — Progress Notes (Addendum)
Physical Therapy Treatment Patient Details Name: [name] MRN: [id number] DOB: 08/10/1927 Today's Date: 06/19/2022   History of Present Illness 89 yo female presents to therapy from ED secondary to fall OOB and pt unable to WB on L foot since that time. Pt also c/o R shoulder pain. Imaging negative for acute findings for L foot and ankle. for Pt lives at home with caregiver assist. Daughter concerned per progression of dementia with recent medication changes and concern per family ability to provide pt care. Pt PMH including but not limited to: HTN, A-flutter, hypothyroidism and L BaCa.    PT Comments    Pt admitted with above diagnosis. Pt currently with functional limitations due to the deficits listed below (see PT Problem List). PT notified in PM that pt was unable to transfer from bed to wc, pt was agitated and reporting LLE pain. Daughter present and reported she cannot take pt home at this level of functional mobility.  PT attempted to engage pt with LE dressing and progress to EOB, pt declined, pt resistive to donning pants and adamantly declined to get OOB with redirection and encouragement. Pt is unsafe to transition home with daughter and 24/7 S and A at this time. PT attributes change in presentation to dementia. Pt will benefit from skilled PT to increase their independence and safety with mobility to allow discharge to next venue with current recommendation for SNF. PT encouraged daughter to consider LTC setting.     Recommendations for follow up therapy are one component of a multi-disciplinary discharge planning process, led by the attending physician.  Recommendations may be updated based on patient status, additional functional criteria and insurance authorization.  Follow Up Recommendations  Skilled nursing-short term rehab (<3 hours/day) Can patient physically be transported by private vehicle: No   Assistance Recommended at Discharge Frequent or constant  Supervision/Assistance  Patient can return home with the following A lot of help with walking and/or transfers;A lot of help with bathing/dressing/bathroom;Assistance with cooking/housework;Direct supervision/assist for medications management;Direct supervision/assist for financial management;Assist for transportation;Help with stairs or ramp for entrance   Equipment Recommendations  Other (comment)    Recommendations for Other Services       Precautions / Restrictions Precautions Precautions: Fall;Other (comment) Restrictions Weight Bearing Restrictions: No     Mobility  Bed Mobility Overal bed mobility: Needs Assistance Bed Mobility: Supine to Sit, Sidelying to Sit   Sidelying to sit: Min assist Supine to sit: Max assist          Transfers Overall transfer level: Needs assistance Equipment used: Rolling walker (2 wheels) Transfers: Sit to/from Stand Sit to Stand: Min assist           General transfer comment: cues and increaed time    Ambulation/Gait Ambulation/Gait assistance: Min assist Gait Distance (Feet): 15 Feet Assistive device: Rolling walker (2 wheels) Gait Pattern/deviations: Step-to pattern, Shuffle, Wide base of support Gait velocity: decreased     General Gait Details: pt required assist for RW management, cues for proper distance from RW and increased time for all functional moblity tasks with ongoing cues for attention   Stairs             Wheelchair Mobility    Modified Rankin (Stroke Patients Only)       Balance Overall balance assessment: History of Falls Sitting-balance support: Feet supported Sitting balance-Leahy Scale: Fair     Standing balance support: Bilateral upper extremity supported, Reliant on assistive device for balance Standing balance-Leahy Scale: Poor  Cognition Arousal/Alertness: Awake/alert Behavior During Therapy: Agitated, Impulsive, Restless Overall  Cognitive Status: History of cognitive impairments - at baseline                                 General Comments: daughter present        Exercises      General Comments        Pertinent Vitals/Pain Pain Assessment Pain Assessment: Faces Pain Score: 4  Faces Pain Scale: Hurts little more Pain Location: L foot    Home Living                          Prior Function            PT Goals (current goals can now be found in the care plan section) Acute Rehab PT Goals PT Goal Formulation: With family Time For Goal Achievement: 07/04/22 Potential to Achieve Goals: Fair    Frequency    Min 3X/week      PT Plan      Co-evaluation              AM-PAC PT "6 Clicks" Mobility   Outcome Measure  Help needed turning from your back to your side while in a flat bed without using bedrails?: A Lot Help needed moving from lying on your back to sitting on the side of a flat bed without using bedrails?: A Lot Help needed moving to and from a bed to a chair (including a wheelchair)?: A Lot Help needed standing up from a chair using your arms (e.g., wheelchair or bedside chair)?: A Lot Help needed to walk in hospital room?: A Lot Help needed climbing 3-5 steps with a railing? : Total 6 Click Score: 11    End of Session     Patient left: in bed;with bed alarm set;with family/visitor present Nurse Communication: Mobility status;Other (comment) (d/c planning) PT Visit Diagnosis: Unsteadiness on feet (R26.81);Other abnormalities of gait and mobility (R26.89);Muscle weakness (generalized) (M62.81);History of falling (Z91.81);Difficulty in walking, not elsewhere classified (R26.2)     Time: [id number] PT Time Calculation (min) (ACUTE ONLY): 16 min  Charges:                        Baird Lyons, PT    Adair Patter 06/19/2022, 4:55 PM

## 2022-06-19 NOTE — ED Provider Notes (Addendum)
Patient is currently waiting in the ED for skilled nursing placement.  Patient is alert and awake in no distress at the bedside. She remains demented.  Patient very pleasantly told me she made me pot of coffee and some cookies and I should help myself.  Will continue to monitor.  No signs of any acute medical issues.   Dorie Rank, MD 06/19/22 1352 Was assessed by PT.  Case management has also been involved.  Patient is stable for discharge with home health.   Dorie Rank, MD 06/19/22 1438

## 2022-06-20 DIAGNOSIS — F0390 Unspecified dementia without behavioral disturbance: Secondary | ICD-10-CM | POA: Diagnosis not present

## 2022-06-20 DIAGNOSIS — R609 Edema, unspecified: Secondary | ICD-10-CM | POA: Diagnosis not present

## 2022-06-20 DIAGNOSIS — Z853 Personal history of malignant neoplasm of breast: Secondary | ICD-10-CM | POA: Diagnosis not present

## 2022-06-20 DIAGNOSIS — E039 Hypothyroidism, unspecified: Secondary | ICD-10-CM | POA: Diagnosis not present

## 2022-06-20 DIAGNOSIS — M79671 Pain in right foot: Secondary | ICD-10-CM | POA: Diagnosis not present

## 2022-06-20 DIAGNOSIS — N183 Chronic kidney disease, stage 3 unspecified: Secondary | ICD-10-CM | POA: Diagnosis not present

## 2022-06-20 DIAGNOSIS — L0390 Cellulitis, unspecified: Secondary | ICD-10-CM | POA: Diagnosis not present

## 2022-06-20 DIAGNOSIS — W19XXXD Unspecified fall, subsequent encounter: Secondary | ICD-10-CM | POA: Diagnosis not present

## 2022-06-20 DIAGNOSIS — G301 Alzheimer's disease with late onset: Secondary | ICD-10-CM | POA: Diagnosis not present

## 2022-06-20 DIAGNOSIS — Z85038 Personal history of other malignant neoplasm of large intestine: Secondary | ICD-10-CM | POA: Diagnosis not present

## 2022-06-20 DIAGNOSIS — R296 Repeated falls: Secondary | ICD-10-CM | POA: Diagnosis not present

## 2022-06-20 DIAGNOSIS — C50912 Malignant neoplasm of unspecified site of left female breast: Secondary | ICD-10-CM | POA: Diagnosis not present

## 2022-06-20 DIAGNOSIS — Z7401 Bed confinement status: Secondary | ICD-10-CM | POA: Diagnosis not present

## 2022-06-20 DIAGNOSIS — I4892 Unspecified atrial flutter: Secondary | ICD-10-CM | POA: Diagnosis not present

## 2022-06-20 DIAGNOSIS — I471 Supraventricular tachycardia, unspecified: Secondary | ICD-10-CM | POA: Diagnosis not present

## 2022-06-20 DIAGNOSIS — E538 Deficiency of other specified B group vitamins: Secondary | ICD-10-CM | POA: Diagnosis not present

## 2022-06-20 DIAGNOSIS — I10 Essential (primary) hypertension: Secondary | ICD-10-CM | POA: Diagnosis not present

## 2022-06-20 DIAGNOSIS — M13871 Other specified arthritis, right ankle and foot: Secondary | ICD-10-CM | POA: Diagnosis not present

## 2022-06-20 DIAGNOSIS — I129 Hypertensive chronic kidney disease with stage 1 through stage 4 chronic kidney disease, or unspecified chronic kidney disease: Secondary | ICD-10-CM | POA: Diagnosis not present

## 2022-06-20 DIAGNOSIS — F02B18 Dementia in other diseases classified elsewhere, moderate, with other behavioral disturbance: Secondary | ICD-10-CM | POA: Diagnosis not present

## 2022-06-20 DIAGNOSIS — M064 Inflammatory polyarthropathy: Secondary | ICD-10-CM | POA: Diagnosis not present

## 2022-06-20 NOTE — ED Notes (Signed)
[name], NT and [name], NT were able to assess pt's brief for wetness, however pt is dry, pt has not urinated or had a BM during the night, brief and bed linens dry. Pt repositioned for comfort and cover back up so she may continue to rest.

## 2022-06-20 NOTE — Progress Notes (Addendum)
This CSW spoke with Pamala Hurry to present bed offer at [facility] (in memory care) and Pamala Hurry has declined due to distance at this time. She inquired about offers in [place] and this Probation officer informed there were not any at this time. Preferred SNFs are [facility] and [facility]. Informed Pamala Hurry that this Probation officer has followed up via text with all pending SNFs. Explained the process with preferred SNFs and bed availability.   There was a brief conversation between [place] and her husband about taking the pt home if she is ambulatory. Pamala Hurry decided to continue SNF search at this time. TOC following.   Addend @ 11:44 AM Presented bed offers to [facility]. They wish to accept [facility] at this time. Following up with [facility] to determine bed availability. TOC to initiate Auth.   Addend @ 12:15 PM [facility] rescinded bed offer. Family accepted [facility]. Family to tour. TOC to initiate.

## 2022-06-20 NOTE — ED Notes (Signed)
Patient daughter in today asking if patient got prednisone for foot - informed that prednisone was not ordered nor apart of home meds.

## 2022-06-20 NOTE — ED Notes (Signed)
Report called to Report to RN at rehab

## 2022-06-20 NOTE — Progress Notes (Signed)
Auth approved. Pt will d/c to [facility] today. EDP and RN notified. PTAR to transport.

## 2022-06-20 NOTE — ED Notes (Signed)
Pt refused to take her am medication, pt stated "I am not taking that...no medication...posion". Pt cover her mouth with her hand and also used her right arm to swat at this RN, witness by sitter Helene Kelp A, NT.

## 2022-06-20 NOTE — ED Provider Notes (Signed)
Patient is in stable medical condition and will go to the nursing home today   Milton Ferguson, MD 06/20/22 1452

## 2022-06-21 DIAGNOSIS — I10 Essential (primary) hypertension: Secondary | ICD-10-CM | POA: Diagnosis not present

## 2022-06-21 DIAGNOSIS — E039 Hypothyroidism, unspecified: Secondary | ICD-10-CM | POA: Diagnosis not present

## 2022-06-21 DIAGNOSIS — W19XXXD Unspecified fall, subsequent encounter: Secondary | ICD-10-CM | POA: Diagnosis not present

## 2022-06-21 DIAGNOSIS — Z853 Personal history of malignant neoplasm of breast: Secondary | ICD-10-CM | POA: Diagnosis not present

## 2022-06-21 DIAGNOSIS — G301 Alzheimer's disease with late onset: Secondary | ICD-10-CM | POA: Diagnosis not present

## 2022-06-21 DIAGNOSIS — F02B18 Dementia in other diseases classified elsewhere, moderate, with other behavioral disturbance: Secondary | ICD-10-CM | POA: Diagnosis not present

## 2022-06-30 ENCOUNTER — Other Ambulatory Visit: Payer: Self-pay | Admitting: Neurology

## 2022-07-07 DIAGNOSIS — M25532 Pain in left wrist: Secondary | ICD-10-CM | POA: Diagnosis not present

## 2022-07-07 DIAGNOSIS — M79642 Pain in left hand: Secondary | ICD-10-CM | POA: Diagnosis not present

## 2022-07-12 DIAGNOSIS — H3530 Unspecified macular degeneration: Secondary | ICD-10-CM | POA: Diagnosis not present

## 2022-07-12 DIAGNOSIS — M064 Inflammatory polyarthropathy: Secondary | ICD-10-CM | POA: Diagnosis not present

## 2022-07-12 DIAGNOSIS — G301 Alzheimer's disease with late onset: Secondary | ICD-10-CM | POA: Diagnosis not present

## 2022-07-12 DIAGNOSIS — D649 Anemia, unspecified: Secondary | ICD-10-CM | POA: Diagnosis not present

## 2022-07-12 DIAGNOSIS — F02B18 Dementia in other diseases classified elsewhere, moderate, with other behavioral disturbance: Secondary | ICD-10-CM | POA: Diagnosis not present

## 2022-07-12 DIAGNOSIS — N183 Chronic kidney disease, stage 3 unspecified: Secondary | ICD-10-CM | POA: Diagnosis not present

## 2022-07-12 DIAGNOSIS — I129 Hypertensive chronic kidney disease with stage 1 through stage 4 chronic kidney disease, or unspecified chronic kidney disease: Secondary | ICD-10-CM | POA: Diagnosis not present

## 2022-07-12 DIAGNOSIS — G510 Bell's palsy: Secondary | ICD-10-CM | POA: Diagnosis not present

## 2022-07-12 DIAGNOSIS — E039 Hypothyroidism, unspecified: Secondary | ICD-10-CM | POA: Diagnosis not present

## 2022-07-13 ENCOUNTER — Telehealth: Payer: Self-pay | Admitting: Neurology

## 2022-07-13 ENCOUNTER — Other Ambulatory Visit: Payer: Self-pay | Admitting: Neurology

## 2022-07-13 MED ORDER — DIVALPROEX SODIUM 250 MG PO DR TAB: 250.0000 mg | DELAYED_RELEASE_TABLET | Freq: Two times a day (BID) | ORAL | 0 refills | Status: DC

## 2022-07-13 NOTE — Telephone Encounter (Signed)
I called patient's daughter, Pamala Hurry, per [facility]. I advised her that Dr. April Manson recommends Depakote 211mBID and [facility] has [facility] [address]. Patient's daughter will let uKoreaknow if she experiences side effects or it does not help with her combativeness.

## 2022-07-13 NOTE — Telephone Encounter (Signed)
I called patient's daughter, [name], per [facility].  Patient was recently discharged from [facility].  While she was there, she was given Depakote to reduce her combativeness.  Patient's daughter is unsure of the dosage.  However, it seemed like the Depakote helped the combativeness.  She does not have Depakote at home.  Over the past few weeks while patient has been home her combativeness has increased.  She takes a whole tablet of Seroquel 25 mg at bedtime.  A caregiver recommended asking for Ativan but patient has never actually tried it.  Patient's daughter is wondering if we can add on another medication in addition to the Seroquel to help with the combativeness related to her dementia and is asking if Dr. April Manson would consider Depakote and/or Ativan.

## 2022-07-13 NOTE — Telephone Encounter (Signed)
Pt's daughter states pt has just been recently discharged from [facility]. While there daughter states pt was given Depakote(daughter does not recall dose) she is asking if this or Adavan(suggested by caretaker) can be taken with QUEtiapine (SEROQUEL) 25 MG tablet , please call daughter to discuss.

## 2022-07-13 NOTE — Telephone Encounter (Addendum)
I called patient's daughter to discuss, no answer, left a voicemail asking her to call us back.  Did the Depakote help with her agitation and if she still taking it?  If she is still taking the Ativan and is it helping with her agitation? Is she still taking seroquel?

## 2022-07-13 NOTE — Telephone Encounter (Signed)
Pt called back and requesting a call back from nurse. Stated she needs to talk to nurse about some new medication.

## 2022-07-13 NOTE — Telephone Encounter (Signed)
We can try her on Depakote again, low dose 250 mg twice daily.

## 2022-07-17 ENCOUNTER — Other Ambulatory Visit: Payer: Self-pay | Admitting: Neurology

## 2022-07-17 ENCOUNTER — Telehealth: Payer: Self-pay | Admitting: Neurology

## 2022-07-17 MED ORDER — LORAZEPAM 0.5 MG PO TABS: 0.5000 mg | ORAL_TABLET | Freq: Two times a day (BID) | ORAL | 0 refills | Status: AC

## 2022-07-17 NOTE — Telephone Encounter (Signed)
Pt's daughter Maudie Mercury has been informed and was very Patent attorney.

## 2022-07-17 NOTE — Telephone Encounter (Signed)
Please call and inform family that I we will try patient on low dose ativan to take twice daily.

## 2022-07-17 NOTE — Telephone Encounter (Signed)
Thanks

## 2022-07-17 NOTE — Telephone Encounter (Signed)
Pt's daughter called stating that the pt has been actually getting worse in the last couple of days and they are wondering if it has to do with the divalproex (DEPAKOTE) 250 MG DR tablet. She understands that it takes a while for the medication to get in her system good but yesterday the pt threw a Candy Dish at the back glass door to break it, threw a lamp at her bedroom window trying to get out and is refusing to use her walker. Daughter also states that normally they give her a plastic cup for safety but yesterday she took a glass cup with her outside and when daughter took her walker out there for her pt decided to throw the glass cup like a baseball when the daughter asked for it and it shattered all over the back patio. Daughter would like to discuss possibly changing medication to something else or increasing dosage on current medication. Please advise.

## 2022-07-18 DIAGNOSIS — M064 Inflammatory polyarthropathy: Secondary | ICD-10-CM | POA: Diagnosis not present

## 2022-07-18 DIAGNOSIS — H3530 Unspecified macular degeneration: Secondary | ICD-10-CM | POA: Diagnosis not present

## 2022-07-18 DIAGNOSIS — I129 Hypertensive chronic kidney disease with stage 1 through stage 4 chronic kidney disease, or unspecified chronic kidney disease: Secondary | ICD-10-CM | POA: Diagnosis not present

## 2022-07-18 DIAGNOSIS — F02B18 Dementia in other diseases classified elsewhere, moderate, with other behavioral disturbance: Secondary | ICD-10-CM | POA: Diagnosis not present

## 2022-07-18 DIAGNOSIS — E039 Hypothyroidism, unspecified: Secondary | ICD-10-CM | POA: Diagnosis not present

## 2022-07-18 DIAGNOSIS — D649 Anemia, unspecified: Secondary | ICD-10-CM | POA: Diagnosis not present

## 2022-07-18 DIAGNOSIS — G301 Alzheimer's disease with late onset: Secondary | ICD-10-CM | POA: Diagnosis not present

## 2022-07-18 DIAGNOSIS — N183 Chronic kidney disease, stage 3 unspecified: Secondary | ICD-10-CM | POA: Diagnosis not present

## 2022-07-18 DIAGNOSIS — G510 Bell's palsy: Secondary | ICD-10-CM | POA: Diagnosis not present

## 2022-07-19 ENCOUNTER — Telehealth: Payer: Self-pay | Admitting: *Deleted

## 2022-07-19 NOTE — Telephone Encounter (Signed)
Received FL2 form to complete for pt. Jael spoke w/ son. He is going to see if PCP can complete. If not, he will call our office back to complete. They are trying to get pt into nursing facility.

## 2022-07-21 DIAGNOSIS — G301 Alzheimer's disease with late onset: Secondary | ICD-10-CM | POA: Diagnosis not present

## 2022-07-21 DIAGNOSIS — H3530 Unspecified macular degeneration: Secondary | ICD-10-CM | POA: Diagnosis not present

## 2022-07-21 DIAGNOSIS — F02B18 Dementia in other diseases classified elsewhere, moderate, with other behavioral disturbance: Secondary | ICD-10-CM | POA: Diagnosis not present

## 2022-07-21 DIAGNOSIS — G510 Bell's palsy: Secondary | ICD-10-CM | POA: Diagnosis not present

## 2022-07-21 DIAGNOSIS — I129 Hypertensive chronic kidney disease with stage 1 through stage 4 chronic kidney disease, or unspecified chronic kidney disease: Secondary | ICD-10-CM | POA: Diagnosis not present

## 2022-07-21 DIAGNOSIS — E039 Hypothyroidism, unspecified: Secondary | ICD-10-CM | POA: Diagnosis not present

## 2022-07-21 DIAGNOSIS — M064 Inflammatory polyarthropathy: Secondary | ICD-10-CM | POA: Diagnosis not present

## 2022-07-21 DIAGNOSIS — D649 Anemia, unspecified: Secondary | ICD-10-CM | POA: Diagnosis not present

## 2022-07-21 DIAGNOSIS — N183 Chronic kidney disease, stage 3 unspecified: Secondary | ICD-10-CM | POA: Diagnosis not present

## 2022-07-24 ENCOUNTER — Telehealth: Payer: Self-pay | Admitting: Neurology

## 2022-07-24 NOTE — Telephone Encounter (Signed)
Lets try to decrease some of the medications first instead of trying an antidepressant.

## 2022-07-24 NOTE — Telephone Encounter (Signed)
Please have them hold the morning dose of Ativan and decrease the Seroquel to half tablet at night.

## 2022-07-24 NOTE — Telephone Encounter (Signed)
Pt daughter aware of providers recommendations. She would like to try remeron.15mg  based on home health nurse recommended to the pts daughter. They would like to know if they can tr that or make the changes you suggested 1st see what happens or try that along w/making those changes.

## 2022-07-24 NOTE — Telephone Encounter (Signed)
PT DAUGHTER AWARE 

## 2022-07-24 NOTE — Telephone Encounter (Signed)
Quetiapine, Depakote,ativan makes the pt zombie like and she cannot even stand and a weak ago she was. They were wanting recommendations of what to do.

## 2022-07-24 NOTE — Telephone Encounter (Signed)
Pt's daughter Pamala Hurry called needing to discuss the pt's medications with RN or MD. 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 Please advise.

## 2022-07-25 DIAGNOSIS — H3530 Unspecified macular degeneration: Secondary | ICD-10-CM | POA: Diagnosis not present

## 2022-07-25 DIAGNOSIS — D649 Anemia, unspecified: Secondary | ICD-10-CM | POA: Diagnosis not present

## 2022-07-25 DIAGNOSIS — G510 Bell's palsy: Secondary | ICD-10-CM | POA: Diagnosis not present

## 2022-07-25 DIAGNOSIS — I129 Hypertensive chronic kidney disease with stage 1 through stage 4 chronic kidney disease, or unspecified chronic kidney disease: Secondary | ICD-10-CM | POA: Diagnosis not present

## 2022-07-25 DIAGNOSIS — G301 Alzheimer's disease with late onset: Secondary | ICD-10-CM | POA: Diagnosis not present

## 2022-07-25 DIAGNOSIS — E039 Hypothyroidism, unspecified: Secondary | ICD-10-CM | POA: Diagnosis not present

## 2022-07-25 DIAGNOSIS — F02B18 Dementia in other diseases classified elsewhere, moderate, with other behavioral disturbance: Secondary | ICD-10-CM | POA: Diagnosis not present

## 2022-07-25 DIAGNOSIS — N183 Chronic kidney disease, stage 3 unspecified: Secondary | ICD-10-CM | POA: Diagnosis not present

## 2022-07-25 DIAGNOSIS — M064 Inflammatory polyarthropathy: Secondary | ICD-10-CM | POA: Diagnosis not present

## 2022-07-26 DIAGNOSIS — E039 Hypothyroidism, unspecified: Secondary | ICD-10-CM | POA: Diagnosis not present

## 2022-07-26 DIAGNOSIS — N183 Chronic kidney disease, stage 3 unspecified: Secondary | ICD-10-CM | POA: Diagnosis not present

## 2022-07-26 DIAGNOSIS — I129 Hypertensive chronic kidney disease with stage 1 through stage 4 chronic kidney disease, or unspecified chronic kidney disease: Secondary | ICD-10-CM | POA: Diagnosis not present

## 2022-07-26 DIAGNOSIS — H3530 Unspecified macular degeneration: Secondary | ICD-10-CM | POA: Diagnosis not present

## 2022-07-26 DIAGNOSIS — G510 Bell's palsy: Secondary | ICD-10-CM | POA: Diagnosis not present

## 2022-07-26 DIAGNOSIS — D649 Anemia, unspecified: Secondary | ICD-10-CM | POA: Diagnosis not present

## 2022-07-26 DIAGNOSIS — F02B18 Dementia in other diseases classified elsewhere, moderate, with other behavioral disturbance: Secondary | ICD-10-CM | POA: Diagnosis not present

## 2022-07-26 DIAGNOSIS — M064 Inflammatory polyarthropathy: Secondary | ICD-10-CM | POA: Diagnosis not present

## 2022-07-26 DIAGNOSIS — G301 Alzheimer's disease with late onset: Secondary | ICD-10-CM | POA: Diagnosis not present

## 2022-07-31 DIAGNOSIS — H3530 Unspecified macular degeneration: Secondary | ICD-10-CM | POA: Diagnosis not present

## 2022-07-31 DIAGNOSIS — F02B18 Dementia in other diseases classified elsewhere, moderate, with other behavioral disturbance: Secondary | ICD-10-CM | POA: Diagnosis not present

## 2022-07-31 DIAGNOSIS — G301 Alzheimer's disease with late onset: Secondary | ICD-10-CM | POA: Diagnosis not present

## 2022-07-31 DIAGNOSIS — N183 Chronic kidney disease, stage 3 unspecified: Secondary | ICD-10-CM | POA: Diagnosis not present

## 2022-07-31 DIAGNOSIS — M064 Inflammatory polyarthropathy: Secondary | ICD-10-CM | POA: Diagnosis not present

## 2022-07-31 DIAGNOSIS — E039 Hypothyroidism, unspecified: Secondary | ICD-10-CM | POA: Diagnosis not present

## 2022-07-31 DIAGNOSIS — D649 Anemia, unspecified: Secondary | ICD-10-CM | POA: Diagnosis not present

## 2022-07-31 DIAGNOSIS — I129 Hypertensive chronic kidney disease with stage 1 through stage 4 chronic kidney disease, or unspecified chronic kidney disease: Secondary | ICD-10-CM | POA: Diagnosis not present

## 2022-07-31 DIAGNOSIS — G510 Bell's palsy: Secondary | ICD-10-CM | POA: Diagnosis not present

## 2022-07-31 NOTE — Telephone Encounter (Signed)
Please advise them to stop the depakote and to continue monitor patient for improvement of mental status.

## 2022-07-31 NOTE — Telephone Encounter (Signed)
Called and made daughter aware, she was agreeable to plan and would let us know how things are going in a few days.

## 2022-07-31 NOTE — Telephone Encounter (Signed)
Daughter called stating that there has been no changes since the decrease of the medications. She states that the divalproex (DEPAKOTE) 250 MG DR tablet may be the culprate of keeping her from moving on her own. She would like to discuss with RN.

## 2022-08-02 DIAGNOSIS — H3530 Unspecified macular degeneration: Secondary | ICD-10-CM | POA: Diagnosis not present

## 2022-08-02 DIAGNOSIS — G301 Alzheimer's disease with late onset: Secondary | ICD-10-CM | POA: Diagnosis not present

## 2022-08-02 DIAGNOSIS — F02B18 Dementia in other diseases classified elsewhere, moderate, with other behavioral disturbance: Secondary | ICD-10-CM | POA: Diagnosis not present

## 2022-08-02 DIAGNOSIS — M064 Inflammatory polyarthropathy: Secondary | ICD-10-CM | POA: Diagnosis not present

## 2022-08-02 DIAGNOSIS — G510 Bell's palsy: Secondary | ICD-10-CM | POA: Diagnosis not present

## 2022-08-02 DIAGNOSIS — E039 Hypothyroidism, unspecified: Secondary | ICD-10-CM | POA: Diagnosis not present

## 2022-08-02 DIAGNOSIS — I129 Hypertensive chronic kidney disease with stage 1 through stage 4 chronic kidney disease, or unspecified chronic kidney disease: Secondary | ICD-10-CM | POA: Diagnosis not present

## 2022-08-02 DIAGNOSIS — D649 Anemia, unspecified: Secondary | ICD-10-CM | POA: Diagnosis not present

## 2022-08-02 DIAGNOSIS — N183 Chronic kidney disease, stage 3 unspecified: Secondary | ICD-10-CM | POA: Diagnosis not present

## 2022-08-03 DIAGNOSIS — H3530 Unspecified macular degeneration: Secondary | ICD-10-CM | POA: Diagnosis not present

## 2022-08-03 DIAGNOSIS — G301 Alzheimer's disease with late onset: Secondary | ICD-10-CM | POA: Diagnosis not present

## 2022-08-03 DIAGNOSIS — G510 Bell's palsy: Secondary | ICD-10-CM | POA: Diagnosis not present

## 2022-08-03 DIAGNOSIS — M064 Inflammatory polyarthropathy: Secondary | ICD-10-CM | POA: Diagnosis not present

## 2022-08-03 DIAGNOSIS — F02B18 Dementia in other diseases classified elsewhere, moderate, with other behavioral disturbance: Secondary | ICD-10-CM | POA: Diagnosis not present

## 2022-08-03 DIAGNOSIS — I129 Hypertensive chronic kidney disease with stage 1 through stage 4 chronic kidney disease, or unspecified chronic kidney disease: Secondary | ICD-10-CM | POA: Diagnosis not present

## 2022-08-03 DIAGNOSIS — N183 Chronic kidney disease, stage 3 unspecified: Secondary | ICD-10-CM | POA: Diagnosis not present

## 2022-08-03 DIAGNOSIS — E039 Hypothyroidism, unspecified: Secondary | ICD-10-CM | POA: Diagnosis not present

## 2022-08-03 DIAGNOSIS — D649 Anemia, unspecified: Secondary | ICD-10-CM | POA: Diagnosis not present

## 2022-08-03 NOTE — Telephone Encounter (Signed)
Called pt daughter left voice mail message to please call office.

## 2022-08-04 ENCOUNTER — Other Ambulatory Visit: Payer: Self-pay | Admitting: Neurology

## 2022-08-05 DIAGNOSIS — F0390 Unspecified dementia without behavioral disturbance: Secondary | ICD-10-CM | POA: Diagnosis not present

## 2022-08-05 DIAGNOSIS — R296 Repeated falls: Secondary | ICD-10-CM | POA: Diagnosis not present

## 2022-08-05 DIAGNOSIS — M064 Inflammatory polyarthropathy: Secondary | ICD-10-CM | POA: Diagnosis not present

## 2022-08-05 DIAGNOSIS — I129 Hypertensive chronic kidney disease with stage 1 through stage 4 chronic kidney disease, or unspecified chronic kidney disease: Secondary | ICD-10-CM | POA: Diagnosis not present

## 2022-08-07 NOTE — Telephone Encounter (Signed)
Requested Prescriptions   Pending Prescriptions Disp Refills   divalproex (DEPAKOTE) 250 MG DR tablet [Pharmacy Med Name: DIVALPROEX SOD DR 250 MG TAB] 180 tablet 1    Sig: TAKE 1 TABLET BY MOUTH TWICE A DAY  Pt last seen by Dr. Teresa Coombs 05/15/22, upcoming appt on 11/16/22 PT REQUESTING 90 DAY SUPPLY. ROUTING TO PROVIDER TO [facility] IF THEY WOULD LIKE TO SEND 90 Dispenses   Dispensed Days Supply Quantity Provider Pharmacy  DIVALPROEX SOD DR 250 MG TAB 07/13/2022 30 60 each Windell Norfolk, MD [facility] [phone] - G.Marland KitchenMarland Kitchen

## 2022-08-08 DIAGNOSIS — F02B18 Dementia in other diseases classified elsewhere, moderate, with other behavioral disturbance: Secondary | ICD-10-CM | POA: Diagnosis not present

## 2022-08-08 DIAGNOSIS — M064 Inflammatory polyarthropathy: Secondary | ICD-10-CM | POA: Diagnosis not present

## 2022-08-08 DIAGNOSIS — G301 Alzheimer's disease with late onset: Secondary | ICD-10-CM | POA: Diagnosis not present

## 2022-08-08 DIAGNOSIS — G510 Bell's palsy: Secondary | ICD-10-CM | POA: Diagnosis not present

## 2022-08-08 DIAGNOSIS — I129 Hypertensive chronic kidney disease with stage 1 through stage 4 chronic kidney disease, or unspecified chronic kidney disease: Secondary | ICD-10-CM | POA: Diagnosis not present

## 2022-08-08 DIAGNOSIS — N183 Chronic kidney disease, stage 3 unspecified: Secondary | ICD-10-CM | POA: Diagnosis not present

## 2022-08-08 DIAGNOSIS — E039 Hypothyroidism, unspecified: Secondary | ICD-10-CM | POA: Diagnosis not present

## 2022-08-08 DIAGNOSIS — H3530 Unspecified macular degeneration: Secondary | ICD-10-CM | POA: Diagnosis not present

## 2022-08-08 DIAGNOSIS — D649 Anemia, unspecified: Secondary | ICD-10-CM | POA: Diagnosis not present

## 2022-08-10 DIAGNOSIS — M064 Inflammatory polyarthropathy: Secondary | ICD-10-CM | POA: Diagnosis not present

## 2022-08-10 DIAGNOSIS — D649 Anemia, unspecified: Secondary | ICD-10-CM | POA: Diagnosis not present

## 2022-08-10 DIAGNOSIS — F02B18 Dementia in other diseases classified elsewhere, moderate, with other behavioral disturbance: Secondary | ICD-10-CM | POA: Diagnosis not present

## 2022-08-10 DIAGNOSIS — H3530 Unspecified macular degeneration: Secondary | ICD-10-CM | POA: Diagnosis not present

## 2022-08-10 DIAGNOSIS — N183 Chronic kidney disease, stage 3 unspecified: Secondary | ICD-10-CM | POA: Diagnosis not present

## 2022-08-10 DIAGNOSIS — G510 Bell's palsy: Secondary | ICD-10-CM | POA: Diagnosis not present

## 2022-08-10 DIAGNOSIS — G301 Alzheimer's disease with late onset: Secondary | ICD-10-CM | POA: Diagnosis not present

## 2022-08-10 DIAGNOSIS — E039 Hypothyroidism, unspecified: Secondary | ICD-10-CM | POA: Diagnosis not present

## 2022-08-10 DIAGNOSIS — I129 Hypertensive chronic kidney disease with stage 1 through stage 4 chronic kidney disease, or unspecified chronic kidney disease: Secondary | ICD-10-CM | POA: Diagnosis not present

## 2022-08-14 ENCOUNTER — Telehealth: Payer: Self-pay | Admitting: Neurology

## 2022-08-14 DIAGNOSIS — F02B18 Dementia in other diseases classified elsewhere, moderate, with other behavioral disturbance: Secondary | ICD-10-CM | POA: Diagnosis not present

## 2022-08-14 DIAGNOSIS — H3530 Unspecified macular degeneration: Secondary | ICD-10-CM | POA: Diagnosis not present

## 2022-08-14 DIAGNOSIS — D649 Anemia, unspecified: Secondary | ICD-10-CM | POA: Diagnosis not present

## 2022-08-14 DIAGNOSIS — I129 Hypertensive chronic kidney disease with stage 1 through stage 4 chronic kidney disease, or unspecified chronic kidney disease: Secondary | ICD-10-CM | POA: Diagnosis not present

## 2022-08-14 DIAGNOSIS — M064 Inflammatory polyarthropathy: Secondary | ICD-10-CM | POA: Diagnosis not present

## 2022-08-14 DIAGNOSIS — N183 Chronic kidney disease, stage 3 unspecified: Secondary | ICD-10-CM | POA: Diagnosis not present

## 2022-08-14 DIAGNOSIS — G301 Alzheimer's disease with late onset: Secondary | ICD-10-CM | POA: Diagnosis not present

## 2022-08-14 DIAGNOSIS — E039 Hypothyroidism, unspecified: Secondary | ICD-10-CM | POA: Diagnosis not present

## 2022-08-14 DIAGNOSIS — G510 Bell's palsy: Secondary | ICD-10-CM | POA: Diagnosis not present

## 2022-08-14 NOTE — Telephone Encounter (Signed)
Called and relayed message to daughter,  had no questions at this time but was encouraged to call back if questions arise.

## 2022-08-14 NOTE — Telephone Encounter (Signed)
Pt daughter called. Requesting a nurse give her a call to discuss pt sundowning and her current medication.

## 2022-08-14 NOTE — Telephone Encounter (Signed)
Please call daughter and advise her to increase the Seroquel to 25 mg twice daily, side effects includes somnolence and drowsiness.

## 2022-08-16 ENCOUNTER — Other Ambulatory Visit: Payer: Self-pay | Admitting: Neurology

## 2022-08-17 DIAGNOSIS — D649 Anemia, unspecified: Secondary | ICD-10-CM | POA: Diagnosis not present

## 2022-08-17 DIAGNOSIS — G510 Bell's palsy: Secondary | ICD-10-CM | POA: Diagnosis not present

## 2022-08-17 DIAGNOSIS — H3530 Unspecified macular degeneration: Secondary | ICD-10-CM | POA: Diagnosis not present

## 2022-08-17 DIAGNOSIS — G301 Alzheimer's disease with late onset: Secondary | ICD-10-CM | POA: Diagnosis not present

## 2022-08-17 DIAGNOSIS — E039 Hypothyroidism, unspecified: Secondary | ICD-10-CM | POA: Diagnosis not present

## 2022-08-17 DIAGNOSIS — F02B18 Dementia in other diseases classified elsewhere, moderate, with other behavioral disturbance: Secondary | ICD-10-CM | POA: Diagnosis not present

## 2022-08-17 DIAGNOSIS — N183 Chronic kidney disease, stage 3 unspecified: Secondary | ICD-10-CM | POA: Diagnosis not present

## 2022-08-17 DIAGNOSIS — M064 Inflammatory polyarthropathy: Secondary | ICD-10-CM | POA: Diagnosis not present

## 2022-08-17 DIAGNOSIS — I129 Hypertensive chronic kidney disease with stage 1 through stage 4 chronic kidney disease, or unspecified chronic kidney disease: Secondary | ICD-10-CM | POA: Diagnosis not present

## 2022-08-23 ENCOUNTER — Other Ambulatory Visit: Payer: Self-pay

## 2022-08-23 ENCOUNTER — Telehealth: Payer: Self-pay | Admitting: Neurology

## 2022-08-23 DIAGNOSIS — M064 Inflammatory polyarthropathy: Secondary | ICD-10-CM | POA: Diagnosis not present

## 2022-08-23 DIAGNOSIS — G510 Bell's palsy: Secondary | ICD-10-CM | POA: Diagnosis not present

## 2022-08-23 DIAGNOSIS — D649 Anemia, unspecified: Secondary | ICD-10-CM | POA: Diagnosis not present

## 2022-08-23 DIAGNOSIS — F02B18 Dementia in other diseases classified elsewhere, moderate, with other behavioral disturbance: Secondary | ICD-10-CM | POA: Diagnosis not present

## 2022-08-23 DIAGNOSIS — H3530 Unspecified macular degeneration: Secondary | ICD-10-CM | POA: Diagnosis not present

## 2022-08-23 DIAGNOSIS — E039 Hypothyroidism, unspecified: Secondary | ICD-10-CM | POA: Diagnosis not present

## 2022-08-23 DIAGNOSIS — G301 Alzheimer's disease with late onset: Secondary | ICD-10-CM | POA: Diagnosis not present

## 2022-08-23 DIAGNOSIS — I129 Hypertensive chronic kidney disease with stage 1 through stage 4 chronic kidney disease, or unspecified chronic kidney disease: Secondary | ICD-10-CM | POA: Diagnosis not present

## 2022-08-23 DIAGNOSIS — N183 Chronic kidney disease, stage 3 unspecified: Secondary | ICD-10-CM | POA: Diagnosis not present

## 2022-08-23 MED ORDER — LORAZEPAM 0.5 MG PO TABS: 0.5000 mg | ORAL_TABLET | Freq: Every evening | ORAL | 2 refills | Status: DC

## 2022-08-23 MED ORDER — QUETIAPINE FUMARATE 25 MG PO TABS: 50.0000 mg | ORAL_TABLET | Freq: Every day | ORAL | 3 refills | Status: DC

## 2022-08-23 NOTE — Telephone Encounter (Signed)
Pt's daughter Britta Mccreedy called wanting to inform provider that the way the pt is taking the QUEtiapine (SEROQUEL) 25 MG tablet and the Lorazepam 0.5 mg is working real well. Britta Mccreedy states that she is needing updated Rx's with the new instructions sent in to the pharmacy. Please advise.

## 2022-08-23 NOTE — Telephone Encounter (Signed)
Yes

## 2022-08-23 NOTE — Telephone Encounter (Signed)
Called and verified dosing with Britta Mccreedy on dpr and sent refill to [facility] for signature

## 2022-08-24 NOTE — Telephone Encounter (Signed)
Called pt daughter. Stated the pharmacy have changed direction at the pharmacy. Stated she just wanted it updated at the office also.

## 2022-08-24 NOTE — Telephone Encounter (Signed)
noted 

## 2022-08-24 NOTE — Telephone Encounter (Signed)
Pt daughter called. Asking if direction for QUEtiapine (SEROQUEL) 25 MG tablet be updated to one pill in morning and one in the evening.

## 2022-08-28 DIAGNOSIS — F02B18 Dementia in other diseases classified elsewhere, moderate, with other behavioral disturbance: Secondary | ICD-10-CM | POA: Diagnosis not present

## 2022-08-28 DIAGNOSIS — D649 Anemia, unspecified: Secondary | ICD-10-CM | POA: Diagnosis not present

## 2022-08-28 DIAGNOSIS — G301 Alzheimer's disease with late onset: Secondary | ICD-10-CM | POA: Diagnosis not present

## 2022-08-28 DIAGNOSIS — I129 Hypertensive chronic kidney disease with stage 1 through stage 4 chronic kidney disease, or unspecified chronic kidney disease: Secondary | ICD-10-CM | POA: Diagnosis not present

## 2022-08-28 DIAGNOSIS — H3530 Unspecified macular degeneration: Secondary | ICD-10-CM | POA: Diagnosis not present

## 2022-08-28 DIAGNOSIS — M064 Inflammatory polyarthropathy: Secondary | ICD-10-CM | POA: Diagnosis not present

## 2022-08-28 DIAGNOSIS — E039 Hypothyroidism, unspecified: Secondary | ICD-10-CM | POA: Diagnosis not present

## 2022-08-28 DIAGNOSIS — N183 Chronic kidney disease, stage 3 unspecified: Secondary | ICD-10-CM | POA: Diagnosis not present

## 2022-08-28 DIAGNOSIS — G510 Bell's palsy: Secondary | ICD-10-CM | POA: Diagnosis not present

## 2022-09-04 DIAGNOSIS — M064 Inflammatory polyarthropathy: Secondary | ICD-10-CM | POA: Diagnosis not present

## 2022-09-04 DIAGNOSIS — I129 Hypertensive chronic kidney disease with stage 1 through stage 4 chronic kidney disease, or unspecified chronic kidney disease: Secondary | ICD-10-CM | POA: Diagnosis not present

## 2022-09-04 DIAGNOSIS — R296 Repeated falls: Secondary | ICD-10-CM | POA: Diagnosis not present

## 2022-09-04 DIAGNOSIS — F0390 Unspecified dementia without behavioral disturbance: Secondary | ICD-10-CM | POA: Diagnosis not present

## 2022-09-07 DIAGNOSIS — D649 Anemia, unspecified: Secondary | ICD-10-CM | POA: Diagnosis not present

## 2022-09-07 DIAGNOSIS — G301 Alzheimer's disease with late onset: Secondary | ICD-10-CM | POA: Diagnosis not present

## 2022-09-07 DIAGNOSIS — M064 Inflammatory polyarthropathy: Secondary | ICD-10-CM | POA: Diagnosis not present

## 2022-09-07 DIAGNOSIS — E039 Hypothyroidism, unspecified: Secondary | ICD-10-CM | POA: Diagnosis not present

## 2022-09-07 DIAGNOSIS — H3530 Unspecified macular degeneration: Secondary | ICD-10-CM | POA: Diagnosis not present

## 2022-09-07 DIAGNOSIS — F02B18 Dementia in other diseases classified elsewhere, moderate, with other behavioral disturbance: Secondary | ICD-10-CM | POA: Diagnosis not present

## 2022-09-07 DIAGNOSIS — I129 Hypertensive chronic kidney disease with stage 1 through stage 4 chronic kidney disease, or unspecified chronic kidney disease: Secondary | ICD-10-CM | POA: Diagnosis not present

## 2022-09-07 DIAGNOSIS — N183 Chronic kidney disease, stage 3 unspecified: Secondary | ICD-10-CM | POA: Diagnosis not present

## 2022-09-07 DIAGNOSIS — G510 Bell's palsy: Secondary | ICD-10-CM | POA: Diagnosis not present

## 2022-09-08 DIAGNOSIS — G510 Bell's palsy: Secondary | ICD-10-CM | POA: Diagnosis not present

## 2022-09-08 DIAGNOSIS — Z9181 History of falling: Secondary | ICD-10-CM | POA: Diagnosis not present

## 2022-09-08 DIAGNOSIS — M064 Inflammatory polyarthropathy: Secondary | ICD-10-CM | POA: Diagnosis not present

## 2022-09-08 DIAGNOSIS — F02B18 Dementia in other diseases classified elsewhere, moderate, with other behavioral disturbance: Secondary | ICD-10-CM | POA: Diagnosis not present

## 2022-09-08 DIAGNOSIS — H3530 Unspecified macular degeneration: Secondary | ICD-10-CM | POA: Diagnosis not present

## 2022-09-08 DIAGNOSIS — G301 Alzheimer's disease with late onset: Secondary | ICD-10-CM | POA: Diagnosis not present

## 2022-09-08 DIAGNOSIS — E039 Hypothyroidism, unspecified: Secondary | ICD-10-CM | POA: Diagnosis not present

## 2022-09-19 DIAGNOSIS — H3530 Unspecified macular degeneration: Secondary | ICD-10-CM | POA: Diagnosis not present

## 2022-09-30 ENCOUNTER — Other Ambulatory Visit: Payer: Self-pay | Admitting: Neurology

## 2022-10-07 ENCOUNTER — Telehealth: Payer: Self-pay | Admitting: Hematology and Oncology

## 2022-10-09 ENCOUNTER — Ambulatory Visit: Payer: Medicare PPO | Admitting: Hematology and Oncology

## 2022-10-09 ENCOUNTER — Inpatient Hospital Stay: Payer: Medicare PPO | Admitting: Adult Health

## 2022-10-23 ENCOUNTER — Other Ambulatory Visit: Payer: Self-pay | Admitting: Hematology and Oncology

## 2022-10-23 ENCOUNTER — Telehealth: Payer: Self-pay | Admitting: Hematology and Oncology

## 2022-10-23 NOTE — Telephone Encounter (Signed)
Called to schedule a yearly follow up with Dr.Gudena per 6/24 staff message. Talked with the patients daughter Britta Mccreedy and she states that the patient is under hospice care. A phone visit was offered but Britta Mccreedy would like to confirm with Dr.Gudena's nurse before scheduling any follow up appointments. Scheduler sent a message to the nursing pool for Dr.Gudena.

## 2022-11-16 ENCOUNTER — Ambulatory Visit: Payer: Medicare PPO | Admitting: Neurology

## 2024-03-31 DEATH — deceased
